# Patient Record
Sex: Male | Born: 1935
Health system: Southern US, Community
[De-identification: ages and names within clinical notes are randomized; demographics above are authoritative.]

## PROBLEM LIST (undated history)

## (undated) DIAGNOSIS — I10 Essential (primary) hypertension: Secondary | ICD-10-CM

## (undated) DIAGNOSIS — K5732 Diverticulitis of large intestine without perforation or abscess without bleeding: Secondary | ICD-10-CM

## (undated) DIAGNOSIS — Z85828 Personal history of other malignant neoplasm of skin: Secondary | ICD-10-CM

## (undated) DIAGNOSIS — IMO0002 Reserved for concepts with insufficient information to code with codable children: Secondary | ICD-10-CM

## (undated) DIAGNOSIS — I4891 Unspecified atrial fibrillation: Secondary | ICD-10-CM

## (undated) DIAGNOSIS — J189 Pneumonia, unspecified organism: Secondary | ICD-10-CM

## (undated) DIAGNOSIS — N189 Chronic kidney disease, unspecified: Secondary | ICD-10-CM

## (undated) DIAGNOSIS — I499 Cardiac arrhythmia, unspecified: Secondary | ICD-10-CM

## (undated) DIAGNOSIS — M1990 Unspecified osteoarthritis, unspecified site: Secondary | ICD-10-CM

## (undated) DIAGNOSIS — R112 Nausea with vomiting, unspecified: Secondary | ICD-10-CM

## (undated) DIAGNOSIS — Z9889 Other specified postprocedural states: Secondary | ICD-10-CM

## (undated) DIAGNOSIS — C61 Malignant neoplasm of prostate: Secondary | ICD-10-CM

## (undated) DIAGNOSIS — T8859XA Other complications of anesthesia, initial encounter: Secondary | ICD-10-CM

## (undated) DIAGNOSIS — T4145XA Adverse effect of unspecified anesthetic, initial encounter: Secondary | ICD-10-CM

## (undated) DIAGNOSIS — K219 Gastro-esophageal reflux disease without esophagitis: Secondary | ICD-10-CM

## (undated) DIAGNOSIS — R51 Headache: Secondary | ICD-10-CM

## (undated) DIAGNOSIS — R519 Headache, unspecified: Secondary | ICD-10-CM

## (undated) HISTORY — PX: NASAL SEPTUM SURGERY: SHX37

## (undated) HISTORY — PX: LASIK: SHX215

## (undated) HISTORY — DX: Reserved for concepts with insufficient information to code with codable children: IMO0002

## (undated) HISTORY — DX: Diverticulitis of large intestine without perforation or abscess without bleeding: K57.32

## (undated) HISTORY — PX: SHOULDER SURGERY: SHX246

## (undated) HISTORY — DX: Unspecified atrial fibrillation: I48.91

## (undated) HISTORY — PX: TONSILLECTOMY: SUR1361

---

## 2001-10-11 ENCOUNTER — Ambulatory Visit (HOSPITAL_COMMUNITY): Admission: RE | Admit: 2001-10-11 | Discharge: 2001-10-11 | Payer: Self-pay | Admitting: *Deleted

## 2007-08-08 ENCOUNTER — Encounter: Admission: RE | Admit: 2007-08-08 | Discharge: 2007-08-08 | Payer: Self-pay | Admitting: Family Medicine

## 2007-08-29 ENCOUNTER — Encounter: Admission: RE | Admit: 2007-08-29 | Discharge: 2007-08-29 | Payer: Self-pay | Admitting: Family Medicine

## 2009-01-07 HISTORY — PX: CARDIOVERSION: SHX1299

## 2010-11-24 ENCOUNTER — Emergency Department (HOSPITAL_COMMUNITY): Payer: Medicare Other

## 2010-11-24 ENCOUNTER — Inpatient Hospital Stay (HOSPITAL_COMMUNITY)
Admission: EM | Admit: 2010-11-24 | Discharge: 2010-12-01 | DRG: 392 | Disposition: A | Discharge status: Home / Self Care | Payer: Medicare Other | Attending: General Surgery | Admitting: General Surgery

## 2010-11-24 DIAGNOSIS — D72829 Elevated white blood cell count, unspecified: Secondary | ICD-10-CM | POA: Diagnosis present

## 2010-11-24 DIAGNOSIS — R21 Rash and other nonspecific skin eruption: Secondary | ICD-10-CM | POA: Diagnosis not present

## 2010-11-24 DIAGNOSIS — K630 Abscess of intestine: Secondary | ICD-10-CM | POA: Diagnosis not present

## 2010-11-24 DIAGNOSIS — J9819 Other pulmonary collapse: Secondary | ICD-10-CM | POA: Diagnosis present

## 2010-11-24 DIAGNOSIS — K5732 Diverticulitis of large intestine without perforation or abscess without bleeding: Principal | ICD-10-CM | POA: Diagnosis present

## 2010-11-24 DIAGNOSIS — Z7982 Long term (current) use of aspirin: Secondary | ICD-10-CM

## 2010-11-24 LAB — COMPREHENSIVE METABOLIC PANEL
AST: 27 U/L (ref 0–37)
Albumin: 3.8 g/dL (ref 3.5–5.2)
CO2: 24 mEq/L (ref 19–32)
Chloride: 103 mEq/L (ref 96–112)
Glucose, Bld: 166 mg/dL — ABNORMAL HIGH (ref 70–99)
Potassium: 4.1 mEq/L (ref 3.5–5.1)
Total Bilirubin: 0.9 mg/dL (ref 0.3–1.2)

## 2010-11-24 LAB — CBC
MCH: 32.4 pg (ref 26.0–34.0)
MCV: 93.7 fL (ref 78.0–100.0)
Platelets: 224 10*3/uL (ref 150–400)
RBC: 4.76 MIL/uL (ref 4.22–5.81)

## 2010-11-24 LAB — URINALYSIS, ROUTINE W REFLEX MICROSCOPIC
Bilirubin Urine: NEGATIVE
Leukocytes, UA: NEGATIVE
Specific Gravity, Urine: 1.027 (ref 1.005–1.030)
pH: 6.5 (ref 5.0–8.0)

## 2010-11-24 LAB — DIFFERENTIAL
Basophils Absolute: 0 10*3/uL (ref 0.0–0.1)
Basophils Relative: 0 % (ref 0–1)
Eosinophils Absolute: 0 10*3/uL (ref 0.0–0.7)
Lymphocytes Relative: 3 % — ABNORMAL LOW (ref 12–46)
Neutro Abs: 18.8 10*3/uL — ABNORMAL HIGH (ref 1.7–7.7)
Neutrophils Relative %: 93 % — ABNORMAL HIGH (ref 43–77)

## 2010-11-24 LAB — URINE MICROSCOPIC-ADD ON

## 2010-11-24 MED ORDER — IOHEXOL 300 MG/ML  SOLN
80.0000 mL | Freq: Once | INTRAMUSCULAR | Status: AC | PRN
  Administered 2010-11-24: 80 mL via INTRAVENOUS

## 2010-11-25 LAB — CBC
Hemoglobin: 13.4 g/dL (ref 13.0–17.0)
MCH: 31.8 pg (ref 26.0–34.0)
Platelets: 169 10*3/uL (ref 150–400)
WBC: 14.6 10*3/uL — ABNORMAL HIGH (ref 4.0–10.5)

## 2010-11-25 LAB — DIFFERENTIAL
Basophils Relative: 0 % (ref 0–1)
Eosinophils Relative: 0 % (ref 0–5)
Lymphocytes Relative: 7 % — ABNORMAL LOW (ref 12–46)
Monocytes Relative: 6 % (ref 3–12)
Neutro Abs: 12.8 10*3/uL — ABNORMAL HIGH (ref 1.7–7.7)

## 2010-11-25 LAB — COMPREHENSIVE METABOLIC PANEL
Alkaline Phosphatase: 48 U/L (ref 39–117)
CO2: 24 mEq/L (ref 19–32)
Calcium: 8.5 mg/dL (ref 8.4–10.5)
Creatinine, Ser: 1.11 mg/dL (ref 0.4–1.5)
GFR calc non Af Amer: >60 mL/min (ref >60)
Glucose, Bld: 137 mg/dL — ABNORMAL HIGH (ref 70–99)

## 2010-11-25 NOTE — H&P (Signed)
[name], Joseph [name] NO.:  [id number]  MEDICAL RECORD NO.:  [id number]           PATIENT TYPE:  I  LOCATION:  5525                         FACILITY:  [facility]  PHYSICIAN:  Standley Dakins, MD   DATE OF BIRTH:  [birth year]  DATE OF ADMISSION:  11/24/2010 DATE OF DISCHARGE:                             HISTORY & PHYSICAL   PRIMARY CARE PHYSICIAN:  Soyla Murphy. Renne Crigler, MD  CHIEF COMPLAINT:  Abdominal pain.  HISTORY OF PRESENT ILLNESS:  This patient is a 75 year old gentleman who reported that he started aching all over his body yesterday.  He reports that he generally did not feel well and he began to experience chills and body sweats and this progressed to  mid-abdominal pain.   He reports that he woke up this morning still not feeling well and had a solid bowel movement.    He reports that after the bowel movement he began to experience dry  heaves and started sweating profusely.  He called and made an appointment with his primary care physician who saw him later this morning.    He was sent to the emergency department immediately with abdominal  pain, possible appendicitis.  The patient was seen and evaluated in the  emergency department and discovered to have a sigmoid diverticulitis  with a small perforation.  The patient will be evaluated by the  Surgical Care Team and was admitted for IV antibiotic treatment.  PAST MEDICAL HISTORY:  History of a recent squamous cell cancer that was removed and history of multiple actinic keratoses.  CURRENT MEDICATIONS: 1. Aspirin 81 mg every other day. 2. Aleve taken p.r.n. for headaches and backaches.  ALLERGIES:  No known drug allergies.  SOCIAL HISTORY:  This patient is a native of [place]  He has 6 years service in the [employer].  He has 16 years of education.  The patient exercises 3-4 times per week.  He is married and has 2-3 alcoholic drinks per week.  REVIEW OF SYSTEMS:  Please see HPI,  otherwise all systems reviewed completely and reported as negative.  FAMILY HISTORY:  The patient reports that his parents died of old age, his mother was aged 63 and father aged 90 when they died.  The patient reports one brother that has a history of prostate cancer.  The patient has two sons that are alive and doing well.  PHYSICAL EXAMINATION:  VITAL SIGNS:  Temperature 99.8, pulse 93, respirations 18, systolic blood pressure 118 and diastolic blood pressure 69, oxygen saturation 93% on room air. GENERAL:  This is a well-developed, well-nourished male.  He is awake, alert, in no distress, cooperative and pleasant and well-spoken. HEENT:  Normocephalic and atraumatic.  Mucous membranes moist and wet. Normal dentition. NECK:  Supple.  No JVD.  Thyroid soft.  No nodules or masses palpated. Trachea midline. LUNGS:  Bilateral breath sounds.  Clear to auscultation.  No crackles, wheezes, or rhonchi. CARDIAC:  Regular S1-S2 sounds without murmurs, rubs, or gallops. ABDOMEN:  Soft with tenderness in the left lower quadrant and guarding present.  Rebound tenderness present in the  left lower quadrant and epigastric area.  Periumbilical tenderness is noted. EXTREMITIES:  No pretibial edema, cyanosis, or clubbing. NEUROLOGIC:  No gross focal deficits.  LABORATORY DATA:  Lipase 27.  Sodium 135, potassium 4.1, glucose 166, creatinine 1.20, bilirubin 0.9, and AST 27.  White blood cell count 20.3, hemoglobin 15.4, hematocrit 44.6, and platelet count 224.  CT of the abdomen and pelvis with contrast positive for perforated sigmoid diverticulitis with small amounts of free intraperitoneal air.  No evidence of abscess or bowel obstruction noted and bibasilar atelectasis noted.  IMPRESSION: 1. Acute perforated sigmoid diverticulitis with small amount of free     intraperitoneal air and no evidence of abscess or bowel     obstruction. 2. Leukocytosis. 3. Bibasilar atelectasis.  PLAN: 1. The  patient is going to be admitted to a Medical Surgical Unit for     IV antibiotics and IV fluids.  The patient will be maintained     n.p.o.  A general surgeon was consulted to evaluate the patient in     the emergency department and will be following the patient during     the hospital stay. 2. I explained the radiological results and laboratory tests to the     patient and his wife who verbalized understanding. 3. We will continue to monitor the patient for signs of worsening     infection or any acute changes.     Standley Dakins, MD     CJ/MEDQ  D:  11/24/2010  T:  11/24/2010  Job:  [id number]  cc:   Soyla Murphy. Renne Crigler, M.D.  Electronically Signed by Standley Dakins  on 11/25/2010 01:18:13 PM

## 2010-11-26 LAB — BASIC METABOLIC PANEL
GFR calc Af Amer: >60 mL/min (ref >60)
GFR calc non Af Amer: >60 mL/min (ref >60)
Potassium: 3.8 mEq/L (ref 3.5–5.1)
Sodium: 137 mEq/L (ref 135–145)

## 2010-11-26 LAB — CBC
HCT: 36 % — ABNORMAL LOW (ref 39.0–52.0)
Hemoglobin: 12 g/dL — ABNORMAL LOW (ref 13.0–17.0)
MCH: 31.4 pg (ref 26.0–34.0)
MCHC: 33.3 g/dL (ref 30.0–36.0)

## 2010-11-27 LAB — CBC
HCT: 33.7 % — ABNORMAL LOW (ref 39.0–52.0)
MCHC: 34.7 g/dL (ref 30.0–36.0)
MCV: 92.8 fL (ref 78.0–100.0)
WBC: 9.2 10*3/uL (ref 4.0–10.5)

## 2010-11-29 ENCOUNTER — Inpatient Hospital Stay (HOSPITAL_COMMUNITY): Payer: Medicare Other

## 2010-11-29 LAB — BASIC METABOLIC PANEL
BUN: 5 mg/dL — ABNORMAL LOW (ref 6–23)
CO2: 26 mEq/L (ref 19–32)
Calcium: 7.7 mg/dL — ABNORMAL LOW (ref 8.4–10.5)
Chloride: 106 mEq/L (ref 96–112)
GFR calc Af Amer: >60 mL/min (ref >60)
GFR calc non Af Amer: >60 mL/min (ref >60)
Glucose, Bld: 109 mg/dL — ABNORMAL HIGH (ref 70–99)
Potassium: 3.4 mEq/L — ABNORMAL LOW (ref 3.5–5.1)

## 2010-11-29 LAB — CBC
MCH: 32.1 pg (ref 26.0–34.0)
MCHC: 35 g/dL (ref 30.0–36.0)
MCV: 91.7 fL (ref 78.0–100.0)

## 2010-11-29 MED ORDER — IOHEXOL 300 MG/ML  SOLN: 100.0000 mL | Freq: Once | INTRAMUSCULAR | Status: AC | PRN

## 2010-11-30 ENCOUNTER — Inpatient Hospital Stay (HOSPITAL_COMMUNITY): Payer: Medicare Other

## 2010-11-30 LAB — PROTIME-INR
INR: 1.17 (ref 0.00–1.49)
Prothrombin Time: 15.1 seconds (ref 11.6–15.2)

## 2010-12-04 LAB — CULTURE, ROUTINE-ABSCESS: Culture: NO GROWTH

## 2010-12-10 NOTE — Consult Note (Signed)
Joseph [name], Joseph [name]                   ACCOUNT NO.:  [id number]  MEDICAL RECORD NO.:  [id number]           PATIENT TYPE:  I  LOCATION:  5525                         FACILITY:  [facility]  PHYSICIAN:  Thornton Park. Daphine Deutscher, MD  DATE OF BIRTH:  February 09, 1936  DATE OF CONSULTATION:  11/25/2010 DATE OF DISCHARGE:                                CONSULTATION   REQUESTING PHYSICIAN:  Zadie Rhine, MD  PRIMARY CARE PHYSICIAN:  Soyla Murphy. Renne Crigler, MD  REASON FOR CONSULTATION:  Perforated sigmoid diverticulitis.  HISTORY OF PRESENT ILLNESS:  Joseph [name] is a 75 year old otherwise healthy white male who developed myalgias, body aches, chills and sweats on Monday morning.  Later in the afternoon, he developed infraumbilical and bilateral lower quadrant abdominal pain.  As the day progressed and his chills and sweats worsened as well as his pain.  Later in the evening around 12 midnight, he had a small bowel movement and then one earlier in the next morning.  He denies any hematochezia.  He then developed significant dry heaves.  Because of worsening pain and symptoms, he presented to his primary care physician yesterday morning. Because he was tender in the right lower quadrant,  he was sent to [facility] emergency department for a CT scan to rule out appendicitis.  Upon arrival, he was found to have a white blood cell count of 20,000.  He had a CT scan of the abdomen and pelvis which revealed perforated sigmoid diverticulitis with a small amount of free intraperitoneal air, but no abscess or bowel obstruction was identified.  The patient states that this is his first episode of documented diverticulitis.  However, he states in the past he has had a few small twinges of pain infraumbilically and in his lower quadrant but has never been treated for.  He did have a colonoscopy by Dr. Carman Ching approximately a year ago.  He does not recall any significant findings.  Because of the CT scan findings, we were  asked to evaluate the patient for further evaluation.  REVIEW OF SYSTEMS:  Please see HPI.  Otherwise, all other systems have been reviewed and are negative.  FAMILY HISTORY:  Noncontributory.  However, the patient does state that his mother has a history of diverticulosis.  PAST MEDICAL HISTORY:  None.  PAST SURGICAL HISTORY: 1. Two right shoulder arthroscopies. 2. One left shoulder arthroscopy. 3. LASIK surgery.  SOCIAL HISTORY:  The patient is married with two children.  He is retired from [employer] work.  He denies any tobacco but has led to occasional alcohol.  ALLERGIES:  NKDA.  MEDICATIONS:  Aspirin 81 mg every other night and Aleve over-the-counter p.r.n. headache.  PHYSICAL EXAMINATION:  GENERAL:  Joseph [name] is a very pleasant 75 year old white male who is otherwise well-developed and well-nourished, but currently lying in bed in no obvious distress.  VITAL SIGNS: Temperature 98.1, pulse 101, respirations 14, blood pressure 99/60. HEENT:  Head is normocephalic, atraumatic.  Sclerae noninjected.  Pupilsare equal, round and reactive to light.  Ears and nose without any obvious masses or lesions.  No rhinorrhea.  Mouth  is pink.  Throat shows no exudate. HEART:  Regular rate and rhythm.  Normal S1-S2.  No murmurs, gallops or rubs are noted.  He does have palpable carotid, radial and pedal pulses bilaterally.  LUNGS:  Clear to auscultation bilaterally with no wheezes, rhonchi or rales noted.  Respiratory is nonlabored. ABDOMEN:  Soft with hypoactive bowel sounds.  He is nondistended.  He is diffusely very tender with active voluntary guarding.  He is minimally symptomatic with questionable early peritoneal signs.  No masses, hernias or organomegaly are noted. MUSCULOSKELETAL:  All four extremities are symmetrical with no cyanosis, clubbing or edema. NEURO:  Cranial nerves II through XII appear to be grossly intact.  Deep tendon reflex exam is deferred at this  time. PSYCH:  The patient is alert and oriented x3 with an appropriate affect.  LABORATORIES AND DIAGNOSTIC:  Sodium 137, potassium 4.2, glucose 137, BUN 16, creatinine 1.11.  LFTs are all unremarkable except for a slight elevation in bilirubin at 1.3, his albumin is 2.9.  White blood cell count is 14,600 down from 20,000 yesterday, hemoglobin 13.4, hematocrit 39.3, platelet count is 169,000 with a neutrophil count of 88%.  Lipase is 27.  Diagnostic CT scan of the abdomen and pelvis reveals a perforated sigmoid diverticulitis with small amount of free intraperitoneal air, but no abscess or obstruction is noted.  IMPRESSION: 1. Perforated sigmoid diverticulitis. 2. Leukocytosis.  PLAN:  Currently, the patient is quite tender and does have some evidence of free intraperitoneal air on a CT scan, however, the patient has been without pain medicine for several hours and still seems quite comfortable and his white blood cell count is decreasing.  Currently, I do not feel that the patient is out of woods from the need of emergent exploration, however, I do not think he currently needs an emergent exploration.  We will try conservative management with n.p.o. status, IV fluids and IV antibiotics.  He is currently on Cipro and Flagyl.  This is his first episode of diverticulitis and so Cipro and Flagyl may be able to suffice for treatment.  However, if he begins to worsen, we may need to switch him to IV Invanz.  I have discussed all this with the patient and his wife.  I have also discussed the possibility of emergent surgery as well as the possible need for a colostomy with them as well. I have also discussed the potential of developing an abscess and a possible percutaneous drain if that were needed.  They understand all of this.  I have discussed the patient with Dr. Marthann Schiller of the [facility].  The patient is otherwise healthy and we will transfer him to [facility].     Letha Cape, PA   ______________________________ Thornton Park Daphine Deutscher, MD    KEO/MEDQ  D:  11/25/2010  T:  11/25/2010  Job:  [id number]  cc:   Zadie Rhine, MD Soyla Murphy. Renne Crigler, M.D. [facility] Surgery  Electronically Signed by Barnetta Chapel PA on 11/30/2010 02:32:36 PM Electronically Signed by Luretha Murphy MD on 12/10/2010 08:42:46 AM

## 2010-12-11 ENCOUNTER — Ambulatory Visit (INDEPENDENT_AMBULATORY_CARE_PROVIDER_SITE_OTHER): Payer: Medicare Other | Admitting: Cardiology

## 2010-12-11 ENCOUNTER — Encounter: Payer: Self-pay | Admitting: Cardiology

## 2010-12-11 ENCOUNTER — Other Ambulatory Visit (HOSPITAL_COMMUNITY): Payer: Self-pay | Admitting: Surgery

## 2010-12-11 ENCOUNTER — Other Ambulatory Visit: Payer: Self-pay | Admitting: Surgery

## 2010-12-11 VITALS — BP 120/80 | HR 104 | Wt 155.0 lb

## 2010-12-11 DIAGNOSIS — K5792 Diverticulitis of intestine, part unspecified, without perforation or abscess without bleeding: Secondary | ICD-10-CM

## 2010-12-11 DIAGNOSIS — K5720 Diverticulitis of large intestine with perforation and abscess without bleeding: Secondary | ICD-10-CM

## 2010-12-11 DIAGNOSIS — I499 Cardiac arrhythmia, unspecified: Secondary | ICD-10-CM

## 2010-12-11 DIAGNOSIS — K5732 Diverticulitis of large intestine without perforation or abscess without bleeding: Secondary | ICD-10-CM

## 2010-12-11 DIAGNOSIS — I4891 Unspecified atrial fibrillation: Secondary | ICD-10-CM | POA: Insufficient documentation

## 2010-12-11 MED ORDER — DILTIAZEM HCL ER COATED BEADS 240 MG PO CP24: 240.0000 mg | ORAL_CAPSULE | Freq: Every day | ORAL | Status: DC

## 2010-12-11 NOTE — Assessment & Plan Note (Signed)
This pleasant 75 year old gentleman was being seen by his surgeon today for a postoperative check and he was noticed by the nurse to have a rapid irregular pulse.  There is no prior history of known arrhythmia.  The patient had been hospitalized several weeks ago for a ruptured diverticulum and has been on for 10 days.  While he was in the hospital there was no mention of any arrhythmia.  The patient has been asymptomatic in terms of his arrhythmia.  He himself was not aware that his heart was out of rhythm.  He has had no chest pain or shortness of breath or dizziness or syncope.  His had no palpitations and no TIA symptoms.

## 2010-12-11 NOTE — Assessment & Plan Note (Signed)
The patient is recovering from a ruptured diverticulum.  He has a drain in place.  He has not been having any fever or chills or postoperative complications

## 2010-12-11 NOTE — Progress Notes (Signed)
Joseph [name] Date of Birth:  1936/03/15 [facility] / [facility] [address]   [address] [place], [place]  [address] [phone]           Fax   [phone]  History of Present Illness: We were asked to see this pleasant 75 year old gentleman by Dr. Wenda Low for evaluation of a rapid irregular heartbeat.  The patient was in Dr. Ermalene Searing office today for a routine postoperative check and his nurse noted that the patient's pulse was rapid and irregular.  The patient has had no cardiac symptoms and was not aware of the arrhythmia.  He has not had any TIA symptoms.  The patient has been home for 10 days from the hospital.  While in the hospital no mention of any arrhythmia was made.  Normally the patient is very physically active and plays tennis several times a week.  There is no history of any exertional chest pain or angina.  The patient denies any history of congestive heart failure diabetes or hypertension.  He is on no regular medicines except for a baby aspirin.  Now he is also on antibiotics postoperatively.His family history is negative for arrhythmias or pacemakers.  Current Outpatient Prescriptions  Medication Sig Dispense Refill  . aspirin 81 MG tablet Take 81 mg by mouth daily. Takes every other day       . ciprofloxacin (CIPRO) 500 MG tablet Take 500 mg by mouth 2 (two) times daily.        . metroNIDAZOLE (FLAGYL) 500 MG tablet Take 500 mg by mouth 3 (three) times daily.        Marland Kitchen diltiazem (CARTIA XT) 240 MG 24 hr capsule Take 1 capsule (240 mg total) by mouth daily.  30 capsule  3    No Known Allergies  Patient Active Problem List  Diagnoses  . Atrial fibrillation with rapid ventricular response  . Diverticulitis of colon with perforation    History  Smoking status  . Never Smoker   Smokeless tobacco  . Not on file    History  Alcohol Use: Not on file    No family history on file.  Review of Systems: Constitutional: no fever chills diaphoresis or  fatigue or change in weight.  Head and neck: no hearing loss, no epistaxis, no photophobia or visual disturbance. Respiratory: No cough, shortness of breath or wheezing. Cardiovascular: No chest pain peripheral edema, palpitations. Gastrointestinal: No abdominal distention, no abdominal pain, no change in bowel habits hematochezia or melena.Recent acute diverticulitis with perforation. Genitourinary: No dysuria, no frequency, no urgency, no nocturia. Musculoskeletal:No arthralgias, no back pain, no gait disturbance or myalgias. Neurological: No dizziness, no headaches, no numbness, no seizures, no syncope, no weakness, no tremors. Hematologic: No lymphadenopathy, no easy bruising. Psychiatric: No confusion, no hallucinations, no sleep disturbance.    Physical Exam: Filed Vitals:   12/11/10 1538  BP: 120/80  Pulse: 104  The general appearance reveals a healthy-appearing gentleman in no distress.Pupils equal and reactive.   Extraocular Movements are full.  There is no scleral icterus.  The mouth and pharynx are normal.  The neck is supple.  The carotids reveal no bruits.  The jugular venous pressure is normal.  The thyroid is not enlarged.  There is no lymphadenopathy.The chest is clear to percussion and auscultation. There are no rales or rhonchi. Expansion of the chest is symmetrical. The precordium is quiet.  The first heart sound is normal.  The second heart sound is physiologically split.  There is no murmur gallop rub or click.  There is no abnormal lift or heave.  The rhythm is irregular.  The abdomen is soft and nontender.  He has a drain in the lower abdomen.The pedal pulses are good.  There is no phlebitis or edema.  There is no cyanosis or clubbing.Strength is normal and symmetrical in all extremities.  There is no lateralizing weakness.  There are no sensory deficits.The skin is warm and dry.  There is no rash.No muscle weakness or atrophy.  No muscle tenderness.  EKG shows atrial  fibrillation with rapid ventricular response.  No ischemic changes.  No prior EKG available for comparison.    Assessment / Plan: Recent onset of atrial fibrillation occurring within the past 10 days.  No precipitating event but he did have a cup of regular coffee this morning which is somewhat unusual for him.  Generally he avoids caffeine.  He has a CHADSS score of zero.  Our plan is to put him on a full dose 325 mg aspirin.  He will not need to go on Coumadin.  We will get an echocardiogram.  We will put him on diltiazem CD 240 mg one daily to control ventricular rate and to see if we can get him back in sinus rhythm.  We'll have him return in one week for a followup office visit.

## 2010-12-14 ENCOUNTER — Ambulatory Visit (HOSPITAL_COMMUNITY): Payer: Medicare Other | Attending: Internal Medicine | Admitting: Radiology

## 2010-12-14 DIAGNOSIS — I499 Cardiac arrhythmia, unspecified: Secondary | ICD-10-CM

## 2010-12-14 DIAGNOSIS — I4891 Unspecified atrial fibrillation: Secondary | ICD-10-CM

## 2010-12-15 ENCOUNTER — Encounter (HOSPITAL_COMMUNITY): Payer: Self-pay

## 2010-12-15 ENCOUNTER — Ambulatory Visit (HOSPITAL_COMMUNITY)
Admission: RE | Admit: 2010-12-15 | Discharge: 2010-12-15 | Disposition: A | Discharge status: Home / Self Care | Payer: Medicare Other | Source: Ambulatory Visit | Attending: Surgery | Admitting: Surgery

## 2010-12-15 DIAGNOSIS — K5792 Diverticulitis of intestine, part unspecified, without perforation or abscess without bleeding: Secondary | ICD-10-CM

## 2010-12-15 DIAGNOSIS — K5732 Diverticulitis of large intestine without perforation or abscess without bleeding: Secondary | ICD-10-CM | POA: Insufficient documentation

## 2010-12-15 MED ORDER — IOHEXOL 300 MG/ML  SOLN
100.0000 mL | Freq: Once | INTRAMUSCULAR | Status: AC | PRN
  Administered 2010-12-15: 100 mL via INTRAVENOUS

## 2010-12-15 NOTE — Discharge Summary (Signed)
NAMESABIN, GIBEAULT NO.:  [id number]  MEDICAL RECORD NO.:  [id number]           PATIENT TYPE:  I  LOCATION:  5525                         FACILITY:  [facility]  PHYSICIAN:  Almond Lint, MD       DATE OF BIRTH:  [birth year]  DATE OF ADMISSION:  11/24/2010 DATE OF DISCHARGE:  12/01/2010                              DISCHARGE SUMMARY   HISTORY OF PRESENT ILLNESS:  Mr. Joseph [name] is a 75 year old male who presented with complaint of abdominal pain and generalized body aches. He made his appointment with his primary care physician who actually send him to the emergency department immediately concern for an acute abdomen.  Upon evaluation in the emergency department, the patient was worked up and was found have an elevated white blood cell count of 20,000 with CT scan showing evidence of sigmoid diverticulitis with perforation and small amounts of air.  No evidence of abscess or obstruction was noted at that time.  The patient was seen by Dr. Daphine Deutscher and decision was made to admit the patient for inpatient management.  SUMMARY OF HOSPITAL WORSE:  The patient was admitted on November 24, 2010, was started on IV antibiotics and placed at bowel rest.  Within the first 24 hours, the patient did feel somewhat better and his fever had defervesced and white count diminished from 20,000 to 14,000.  He continued clinically improving by both pain as well as white count, which actually normalized by day three of his stay.  He did well on IV antibiotics.  His diet was started on clear liquid advanced to full liquids.  A followup CT scan was ordered and carried out on November 29, 2010, this showed improved, but persistent inflammatory changes in diverticulitis, but there was now a fluid collection measuring approximately 3 x 2.5 cm suspicious for developing abscess.  The Interventional Radiology team was asked to review the CT and a subsequent CT-guided percutaneous drain placement was  carried out on November 30, 2010.  The patient tolerated this well and has not had any change in his clinical symptoms.  He continues to be afebrile and feel significantly better.  The patient's diet was advanced to a low residue diet and the Nutrition Service educated the patient on this diet to continue at home.  He was switched from IV antibiotics to pill-form antibiotics which he has tolerated well.  He did develop a macular pruritic rash on his back that was initially thought to be possibly contact dermatitis from his bed linens, but this could possibly be drug reaction as he now is starting to have some of these lesions develop on his anterior trunk.  However, he has had no shortness of breath, wheezing or breathing difficulty as far as reaction goes.  It is our opinion at this point that the patient could be discharged home in a stable condition.  All his instructions have been reviewed with him.  DISCHARGE DIAGNOSIS:  Sigmoid diverticulitis with microperforation and abscess, now status post percutaneous drainage.  DISCHARGE MEDICATIONS:  The patient will be given a prescription for: 1. Cipro 500 mg one  tablet twice daily. 2. Flagyl 500 mg one tablet three times daily. 3. Vicodin 5/500 one tablet q.4-6 h. p.r.n. pain. 4. He can resume his 81 mg aspirin at home.  He is to follow a low-residue diet as directed.  He is to flush his drain was 5 mL normal saline twice daily while at home and measure his output.  He is to follow up with Dr. Daphine Deutscher in approximately 7-10 days for continued management.  The patient understands that he has recurrence of severe abdominal pain, nausea, vomiting, or high fever, and he is to contact our office immediately for possible readmission for recurrent diverticulitis.     Brayton El, PA-C   ______________________________ Almond Lint, MD    KB/MEDQ  D:  12/01/2010  T:  12/01/2010  Job:  [id number]  Electronically Signed by Brayton El  on  12/10/2010 09:16:02 AM Electronically Signed by Almond Lint MD on 12/15/2010 03:52:47 PM

## 2010-12-16 ENCOUNTER — Ambulatory Visit (INDEPENDENT_AMBULATORY_CARE_PROVIDER_SITE_OTHER): Payer: Medicare Other | Admitting: Nurse Practitioner

## 2010-12-16 ENCOUNTER — Encounter: Payer: Self-pay | Admitting: Nurse Practitioner

## 2010-12-16 ENCOUNTER — Telehealth: Payer: Self-pay | Admitting: *Deleted

## 2010-12-16 VITALS — BP 108/62 | HR 92 | Wt 155.0 lb

## 2010-12-16 DIAGNOSIS — K5732 Diverticulitis of large intestine without perforation or abscess without bleeding: Secondary | ICD-10-CM

## 2010-12-16 DIAGNOSIS — I4891 Unspecified atrial fibrillation: Secondary | ICD-10-CM

## 2010-12-16 DIAGNOSIS — K5720 Diverticulitis of large intestine with perforation and abscess without bleeding: Secondary | ICD-10-CM

## 2010-12-16 LAB — CBC WITH DIFFERENTIAL/PLATELET
Basophils Absolute: 0 10*3/uL (ref 0.0–0.1)
Basophils Relative: 0.7 % (ref 0.0–3.0)
Eosinophils Absolute: 0.2 10*3/uL (ref 0.0–0.7)
Eosinophils Relative: 2.7 % (ref 0.0–5.0)
HCT: 42.9 % (ref 39.0–52.0)
Hemoglobin: 14.5 g/dL (ref 13.0–17.0)
Lymphocytes Relative: 21.9 % (ref 12.0–46.0)
Lymphs Abs: 1.3 10*3/uL (ref 0.7–4.0)
MCHC: 33.9 g/dL (ref 30.0–36.0)
MCV: 97.9 fl (ref 78.0–100.0)
Monocytes Absolute: 0.6 10*3/uL (ref 0.1–1.0)
Monocytes Relative: 10.3 % (ref 3.0–12.0)
Neutro Abs: 3.8 10*3/uL (ref 1.4–7.7)
Neutrophils Relative %: 64.4 % (ref 43.0–77.0)
Platelets: 290 10*3/uL (ref 150.0–400.0)
RBC: 4.38 Mil/uL (ref 4.22–5.81)
RDW: 14.3 % (ref 11.5–14.6)
WBC: 5.9 10*3/uL (ref 4.5–10.5)

## 2010-12-16 LAB — BASIC METABOLIC PANEL
BUN: 11 mg/dL (ref 6–23)
CO2: 28 mEq/L (ref 19–32)
Calcium: 8.9 mg/dL (ref 8.4–10.5)
Chloride: 104 mEq/L (ref 96–112)
Creatinine, Ser: 1 mg/dL (ref 0.4–1.5)
GFR: 74.34 mL/min (ref >60.00)
Glucose, Bld: 99 mg/dL (ref 70–99)
Potassium: 4.5 mEq/L (ref 3.5–5.1)
Sodium: 139 mEq/L (ref 135–145)

## 2010-12-16 LAB — TSH: TSH: 1.71 u[IU]/mL (ref 0.35–5.50)

## 2010-12-16 NOTE — Telephone Encounter (Signed)
Message copied by Regis Bill on Wed Dec 16, 2010  3:33 PM ------      Message from: Norma Fredrickson      Created: Wed Dec 16, 2010  2:11 PM       Ok to report. Labs are satisfactory. I have sent a copy to Dr. Daphine Deutscher.

## 2010-12-16 NOTE — Progress Notes (Signed)
Left message

## 2010-12-16 NOTE — Telephone Encounter (Signed)
Advised patient of labs 

## 2010-12-16 NOTE — Patient Instructions (Addendum)
You are still in atrial fibrillation I am going to start you on anticoagulation with Pradaxa 150 mg 2 x day. You will see Dr. Patty Sermons next week with a repeat EKG and CBC Call for any problems  Stop aspirin on Friday

## 2010-12-16 NOTE — Assessment & Plan Note (Signed)
Drainage tube is out. He has no evidence of bleeding. CBC will be checked today. If he were to need surgical intervention, the Pradaxa could be stopped 48 hours prior. He will be seeing Dr. Daphine Deutscher tomorrow.

## 2010-12-16 NOTE — Assessment & Plan Note (Signed)
I have discussed his care with Dr. [place]. He has not converted back to sinus. He probably needs one attempt at cardioversion. He will need anticoagulation and Pradaxa is felt to be his better option. He remains on antibiotics.  We have placed him on Pradaxa 150 mg BID. Samples are given. Aspirin will be stopped on Friday. He will be seen again in one week with a repeat EKG. We will also check CBC, BMET and TSH today to rule out other causes. Patient and his wife are agreeable to this plan and will call if any problems develop in the interim.

## 2010-12-16 NOTE — Progress Notes (Signed)
   Joseph [name] Date of Birth: 1936/07/18   History of Present Illness: Joseph [name] is seen back today for a 1 week check. He is seen for Dr. Patty Sermons. He has had a recent admission for a small perforated sigmoid diverticulum. He was managed with antibiotics. His drainage tube was removed yesterday. He is in atrial fib. Duration is unknown. CHADs score is 0. He was placed on aspirin and Cardizem. He is totally asymptomatic from the atrial fib. He is not lightheaded or dizzy. Overall he feels stronger. He really doesn't have palpitations. He will be seeing Dr. Daphine Deutscher for follow up tomorrow.   Current Outpatient Prescriptions on File Prior to Visit  Medication Sig Dispense Refill  . ciprofloxacin (CIPRO) 500 MG tablet Take 500 mg by mouth 2 (two) times daily.        Marland Kitchen diltiazem (CARTIA XT) 240 MG 24 hr capsule Take 1 capsule (240 mg total) by mouth daily.  30 capsule  3  . metroNIDAZOLE (FLAGYL) 500 MG tablet Take 500 mg by mouth 3 (three) times daily.        Marland Kitchen aspirin 81 MG tablet Take 81 mg by mouth daily. Takes every other day         No Known Allergies  Past Medical History  Diagnosis Date  . Atrial fibrillation   . Sigmoid diverticulitis April of 2012    with small perforation  . Squamous cell carcinoma   . OA (osteoarthritis)     Past Surgical History  Procedure Date  . Shoulder surgery     x 2  . Nasal septum surgery   . Lasik     History  Smoking status  . Never Smoker   Smokeless tobacco  . Never Used    History  Alcohol Use No    History reviewed. No pertinent family history.  Review of Systems: The review of systems is as above. He is not having chest pain. He is not dizzy. No palpitations. No fever or chills. Abdomen is feeling better.  All other systems were reviewed and are negative.  Physical Exam: BP 108/62  Pulse 92  Wt 155 lb (70.308 kg) Patient is very pleasant and in no acute distress. He looks younger than his stated age. Skin is warm and dry. Color is  normal.  HEENT is unremarkable. Normocephalic/atraumatic. PERRL. Sclera are nonicteric. Neck is supple. No masses. No JVD. Lungs are clear. Cardiac exam shows an irregular rhythm. Abdomen is soft. Extremities are without edema. Gait and ROM are intact. No gross neurologic deficits noted.  LABORATORY DATA:  EKG today continues to show atrial fib. Rate is controlled. His echo showed normal LV function. He has mild LAE and RAE. He had only mild mitral regurgitation.  Assessment / Plan:

## 2010-12-16 NOTE — Progress Notes (Signed)
Advised patient

## 2010-12-21 ENCOUNTER — Other Ambulatory Visit (HOSPITAL_COMMUNITY): Payer: Medicare Other | Admitting: Radiology

## 2010-12-24 ENCOUNTER — Other Ambulatory Visit (INDEPENDENT_AMBULATORY_CARE_PROVIDER_SITE_OTHER): Payer: Medicare Other | Admitting: *Deleted

## 2010-12-24 ENCOUNTER — Encounter: Payer: Self-pay | Admitting: Cardiology

## 2010-12-24 ENCOUNTER — Ambulatory Visit (INDEPENDENT_AMBULATORY_CARE_PROVIDER_SITE_OTHER): Payer: Medicare Other | Admitting: Cardiology

## 2010-12-24 VITALS — BP 110/70 | HR 81 | Wt 154.0 lb

## 2010-12-24 DIAGNOSIS — K5720 Diverticulitis of large intestine with perforation and abscess without bleeding: Secondary | ICD-10-CM

## 2010-12-24 DIAGNOSIS — I4891 Unspecified atrial fibrillation: Secondary | ICD-10-CM

## 2010-12-24 DIAGNOSIS — K5732 Diverticulitis of large intestine without perforation or abscess without bleeding: Secondary | ICD-10-CM

## 2010-12-24 DIAGNOSIS — Z01818 Encounter for other preprocedural examination: Secondary | ICD-10-CM

## 2010-12-24 LAB — CBC WITH DIFFERENTIAL/PLATELET
Basophils Absolute: 0 10*3/uL (ref 0.0–0.1)
Basophils Relative: 0.7 % (ref 0.0–3.0)
Eosinophils Absolute: 0.2 10*3/uL (ref 0.0–0.7)
Eosinophils Relative: 3.6 % (ref 0.0–5.0)
HCT: 39.6 % (ref 39.0–52.0)
Hemoglobin: 13.5 g/dL (ref 13.0–17.0)
Lymphocytes Relative: 31.8 % (ref 12.0–46.0)
Lymphs Abs: 1.7 10*3/uL (ref 0.7–4.0)
MCHC: 34.1 g/dL (ref 30.0–36.0)
MCV: 98.3 fl (ref 78.0–100.0)
Monocytes Absolute: 0.6 10*3/uL (ref 0.1–1.0)
Monocytes Relative: 10.4 % (ref 3.0–12.0)
Neutro Abs: 2.9 10*3/uL (ref 1.4–7.7)
Neutrophils Relative %: 53.5 % (ref 43.0–77.0)
Platelets: 226 10*3/uL (ref 150.0–400.0)
RBC: 4.03 Mil/uL — ABNORMAL LOW (ref 4.22–5.81)
RDW: 14.4 % (ref 11.5–14.6)
WBC: 5.5 10*3/uL (ref 4.5–10.5)

## 2010-12-24 MED ORDER — DILTIAZEM HCL ER COATED BEADS 300 MG PO CP24
300.0000 mg | ORAL_CAPSULE | Freq: Every day | ORAL | Status: DC
Start: 2010-12-24 — End: 2011-01-19

## 2010-12-24 NOTE — Assessment & Plan Note (Signed)
The patient has a history of recent onset of atrial fibrillation.  The atrial fibrillation was discovered during a routine postoperative surgical visit with Dr. Daphine Deutscher.  His ventricular response was initially rapid but has improved since the addition of diltiazem CD 240 mg daily.  The patient has not been expressing any chest pain or shortness of breath.  He still notes that his heart rate exceeds 100 per minute with any type of exertion.

## 2010-12-24 NOTE — Progress Notes (Signed)
Joseph [name] Date of Birth:  1936/04/02 [facility] / [facility] [address]   [address] [place], [place]  [address] [phone]           Fax   [phone]  HPI: This pleasant 75 year old gentleman is seen for further followup of his recently discovered atrial fibrillation.  He has been on Pradaxa 150 mg twice a day since 12/16/10.  He is not experiencing any thromboembolic symptoms.  He's had no chest pain or shortness of breath.  He notes that his heart rate continues to have a little fast despite being on diltiazem 240 mg daily.  Current Outpatient Prescriptions  Medication Sig Dispense Refill  . dabigatran (PRADAXA) 150 MG CAPS Take 150 mg by mouth every 12 (twelve) hours.        Marland Kitchen DISCONTD: diltiazem (CARTIA XT) 240 MG 24 hr capsule Take 1 capsule (240 mg total) by mouth daily.  30 capsule  3  . diltiazem (CARDIZEM CD) 300 MG 24 hr capsule Take 1 capsule (300 mg total) by mouth daily.  30 capsule  11  . DISCONTD: aspirin 325 MG EC tablet Take 325 mg by mouth daily.        Marland Kitchen DISCONTD: aspirin 81 MG tablet Take 81 mg by mouth daily. Takes every other day       . DISCONTD: ciprofloxacin (CIPRO) 500 MG tablet Take 500 mg by mouth 2 (two) times daily.        Marland Kitchen DISCONTD: metroNIDAZOLE (FLAGYL) 500 MG tablet Take 500 mg by mouth 3 (three) times daily.          No Known Allergies  Patient Active Problem List  Diagnoses  . Atrial fibrillation with rapid ventricular response  . Diverticulitis of colon with perforation    History  Smoking status  . Never Smoker   Smokeless tobacco  . Never Used    History  Alcohol Use No    No family history on file.  Review of Systems: The patient denies any heat or cold intolerance.  No weight gain or weight loss.  The patient denies headaches or blurry vision.  There is no cough or sputum production.  The patient denies dizziness.  There is no hematuria or hematochezia.  The patient denies any muscle aches or arthritis.  The  patient denies any rash.  The patient denies frequent falling or instability.  There is no history of depression or anxiety.  All other systems were reviewed and are negative.   Physical Exam: Filed Vitals:   12/24/10 1456  BP: 110/70  Pulse: 81  The general appearance reveals a well-developed well-nourished gentleman in no distress.Pupils equal and reactive.   Extraocular Movements are full.  There is no scleral icterus.  The mouth and pharynx are normal.  The neck is supple.  The carotids reveal no bruits.  The jugular venous pressure is normal.  The thyroid is not enlarged.  There is no lymphadenopathy.The chest is clear to percussion and auscultation. There are no rales or rhonchi. Expansion of the chest is symmetrical.  Heart reveals no murmur gallop or rub.  The rhythm is irregular.The abdomen is soft and nontender. Bowel sounds are normal. The liver and spleen are not enlarged. There Are no abdominal masses. There are no bruits.  Normal extremity.Strength is normal and symmetrical in all extremities.  There is no lateralizing weakness.  There are no sensory deficits.  EKG today shows atrial fibrillation with a controlled ventricular response and no ischemic  changes.  Assessment / Plan: Continue same medication except increase Diltiazem to 300 mg daily.We will plan for elective cardioversion on Thursday, June 7.  He will return on June 5 for a pre-cardioversion office visit and lab work.He had a chest x-ray on 11/24/10 showing normal heart size and clear lungs.  He had an echocardiogram on 12/14/10 showing ejection fraction 55-60%, mild mitral regurgitation, mild left atrial enlargement

## 2010-12-24 NOTE — Assessment & Plan Note (Signed)
The patient has finished up his antibiotics 4 days ago.  The surgical drain had been removed a week ago.  He is not showing any sign of infection or sepsis.

## 2010-12-25 NOTE — Progress Notes (Signed)
Advised 

## 2010-12-25 NOTE — Procedures (Signed)
[facility]  Patient:    Joseph [name], Joseph [name] Visit Number: [id number] MRN: [id number]          Service Type: END Location: ENDO Attending Physician:  Sabino Gasser Dictated by:   Sabino Gasser, M.D. Proc. Date: 10/11/01 Admit Date:  10/11/2001   CC:         Oley Balm. Georgina Pillion, M.D.   Procedure Report  DATE OF BIRTH:  August 26, 1935  PROCEDURE:  Colonoscopy.  INDICATION:  Colon cancer screening.  PREP:  Visicol tablets.  The patient tolerated it very well, and the prep was excellent.  MEDICATIONS:  DESCRIPTION OF PROCEDURE:  With the patient mildly sedated in the left lateral decubitus position, a rectal exam was performed which was unremarkable. Subsequently, the Olympus videoscopic colonoscope was inserted into the rectum and passed under direct vision to the cecum, identified by the ileocecal valve and appendiceal orifice, both of which were photographed.  From this point, the colonoscope was slowly withdrawn, taking circumferential views of the entire colonic mucosa, stopping only then in the rectum which appeared normal on direct and showed moderately large hemorrhoids on retroflexed view and were internal.  The endoscope was straightened and pulled through the anal canal. Again, the hemorrhoids were seen.  Otherwise unremarkable.  The endoscope was withdrawn.  The patients vital signs and pulse oximeter remained stable.  The patient tolerated the procedure well without apparent complications.  FINDINGS: 1. Moderately large internal hemorrhoids. 2. Otherwise unremarkable examination to the cecum.  PLAN:  We will discuss treatment for hemorrhoids and a high fiber diet and have patient follow up with me on as-needed basis. Dictated by:   Sabino Gasser, M.D. Attending Physician:  Sabino Gasser DD:  10/11/01 TD:  10/11/01 Job: [id number]

## 2010-12-29 ENCOUNTER — Telehealth: Payer: Self-pay | Admitting: *Deleted

## 2010-12-29 NOTE — Progress Notes (Signed)
Left message

## 2010-12-29 NOTE — Telephone Encounter (Signed)
Message copied by Regis Bill on Tue Dec 29, 2010  3:13 PM ------      Message from: Norma Fredrickson      Created: Fri Dec 25, 2010  7:47 AM       Ok to report. Labs are satisfactory.

## 2010-12-29 NOTE — Telephone Encounter (Signed)
Advised of labs 

## 2011-01-05 ENCOUNTER — Ambulatory Visit: Payer: Medicare Other | Admitting: Cardiology

## 2011-01-12 ENCOUNTER — Ambulatory Visit (INDEPENDENT_AMBULATORY_CARE_PROVIDER_SITE_OTHER): Payer: Medicare Other | Admitting: Cardiology

## 2011-01-12 ENCOUNTER — Telehealth: Payer: Self-pay | Admitting: Cardiology

## 2011-01-12 ENCOUNTER — Encounter: Payer: Self-pay | Admitting: Cardiology

## 2011-01-12 ENCOUNTER — Other Ambulatory Visit (INDEPENDENT_AMBULATORY_CARE_PROVIDER_SITE_OTHER): Payer: Medicare Other | Admitting: *Deleted

## 2011-01-12 VITALS — BP 110/60 | HR 82 | Wt 154.2 lb

## 2011-01-12 DIAGNOSIS — Z01818 Encounter for other preprocedural examination: Secondary | ICD-10-CM

## 2011-01-12 DIAGNOSIS — I4891 Unspecified atrial fibrillation: Secondary | ICD-10-CM

## 2011-01-12 LAB — BASIC METABOLIC PANEL
BUN: 15 mg/dL (ref 6–23)
CO2: 30 mEq/L (ref 19–32)
Chloride: 106 mEq/L (ref 96–112)
Creatinine, Ser: 1 mg/dL (ref 0.4–1.5)
Glucose, Bld: 71 mg/dL (ref 70–99)

## 2011-01-12 LAB — CBC WITH DIFFERENTIAL/PLATELET
Basophils Relative: 0.5 % (ref 0.0–3.0)
Eosinophils Absolute: 0.1 10*3/uL (ref 0.0–0.7)
HCT: 45.1 % (ref 39.0–52.0)
Hemoglobin: 15.4 g/dL (ref 13.0–17.0)
MCHC: 34.1 g/dL (ref 30.0–36.0)
MCV: 98.4 fl (ref 78.0–100.0)
Monocytes Absolute: 0.5 10*3/uL (ref 0.1–1.0)
Neutro Abs: 3.6 10*3/uL (ref 1.4–7.7)
RBC: 4.58 Mil/uL (ref 4.22–5.81)

## 2011-01-12 NOTE — Telephone Encounter (Signed)
HAS A BILL THAT HE HAS QUESTIONS ABOUT

## 2011-01-12 NOTE — Assessment & Plan Note (Signed)
The patient has a past history of atrial fibrillation.  The arrhythmia was discovered during a postoperative office visit at Dr. Ermalene Searing office.  The patient has been on Pradaxa for a satisfactory interval and is now ready for elective cardioversion .  He has not been experiencing any chest pain or shortness of breath.  He has not had any thromboembolic symptoms.  He has a normal ejection fraction of 55 percent

## 2011-01-12 NOTE — Progress Notes (Signed)
Joseph [name] Date of Birth:  1935/11/03 [facility] / [facility] [address]   [address] [place], [place]  [address] [phone]           Fax   [phone]  HPI: This pleasant 75 year old gentleman is seen for a pre-cardioversion office visit.  His cardioversion is scheduled for 2 PM on Thursday, June 7.  The patient has been on Pradaxa and Cardizem and he will take both of his medications that morning.  We went over the expected course of Events and we plan to see him back in the office on week after cardioversion.  If he remains in normal sinus rhythm post cardioversion we will probably stop Pradaxa after 3 months  Current Outpatient Prescriptions  Medication Sig Dispense Refill  . dabigatran (PRADAXA) 150 MG CAPS Take 150 mg by mouth every 12 (twelve) hours.        Marland Kitchen diltiazem (CARDIZEM CD) 300 MG 24 hr capsule Take 1 capsule (300 mg total) by mouth daily.  30 capsule  11    No Known Allergies  Patient Active Problem List  Diagnoses  . Atrial fibrillation with rapid ventricular response  . Diverticulitis of colon with perforation    History  Smoking status  . Never Smoker   Smokeless tobacco  . Never Used    History  Alcohol Use No    No family history on file.  Review of Systems: The patient denies any heat or cold intolerance.  No weight gain or weight loss.  The patient denies headaches or blurry vision.  There is no cough or sputum production.  The patient denies dizziness.  There is no hematuria or hematochezia.  The patient denies any muscle aches or arthritis.  The patient denies any rash.  The patient denies frequent falling or instability.  There is no history of depression or anxiety.  All other systems were reviewed and are negative.   Physical Exam: Filed Vitals:   01/12/11 1105  BP: 110/60  Pulse: 82  The general appearance reveals a healthy-appearing gentleman in no distress.  He does have what appears to be thrush of his tongue from  previous antibiotics and we were giving him a description for Mycostatin oral suspension 5 cc swish and swallow 4 times a day.The chest is clear to percussion and auscultation. There are no rales or rhonchi. Expansion of the chest is symmetrical.The precordium is quiet.  The first heart sound is normal.  The second heart sound is physiologically split.  There is no murmur gallop rub or click.  There is no abnormal lift or heave. Rhythm is irregular.The abdomen is soft and nontender. Bowel sounds are normal. The liver and spleen are not enlarged. There Are no abdominal masses. There are no bruits.  Normal extremity.  EKG confirms persistent atrial fib with a controlled ventricular response    Assessment / Plan: Elective direct current cardioversion on Thursday, June 7 at 2 PM

## 2011-01-13 ENCOUNTER — Telehealth: Payer: Self-pay | Admitting: *Deleted

## 2011-01-13 NOTE — Telephone Encounter (Signed)
Message copied by Burnell Blanks on Wed Jan 13, 2011 11:14 AM ------      Message from: Cassell Clement      Created: Wed Jan 13, 2011 10:45 AM       Please report.  The labs are all satisfactory.

## 2011-01-13 NOTE — Progress Notes (Signed)
Left message

## 2011-01-13 NOTE — Telephone Encounter (Signed)
Left message labs ok for cversion tomorrow, patient said at ov ok to leave on machine.

## 2011-01-14 ENCOUNTER — Ambulatory Visit (HOSPITAL_COMMUNITY)
Admission: RE | Admit: 2011-01-14 | Discharge: 2011-01-14 | Disposition: A | Discharge status: Home / Self Care | Payer: Medicare Other | Source: Ambulatory Visit | Attending: Cardiology | Admitting: Cardiology

## 2011-01-14 DIAGNOSIS — I4891 Unspecified atrial fibrillation: Secondary | ICD-10-CM

## 2011-01-14 DIAGNOSIS — K5730 Diverticulosis of large intestine without perforation or abscess without bleeding: Secondary | ICD-10-CM | POA: Insufficient documentation

## 2011-01-19 ENCOUNTER — Ambulatory Visit (INDEPENDENT_AMBULATORY_CARE_PROVIDER_SITE_OTHER): Payer: Medicare Other | Admitting: Cardiology

## 2011-01-19 ENCOUNTER — Encounter: Payer: Self-pay | Admitting: Cardiology

## 2011-01-19 VITALS — BP 126/64 | HR 59 | Ht 67.0 in | Wt 156.8 lb

## 2011-01-19 DIAGNOSIS — I4891 Unspecified atrial fibrillation: Secondary | ICD-10-CM

## 2011-01-19 MED ORDER — DABIGATRAN ETEXILATE MESYLATE 150 MG PO CAPS: 150.0000 mg | ORAL_CAPSULE | Freq: Two times a day (BID) | ORAL | Status: DC

## 2011-01-19 MED ORDER — DILTIAZEM HCL ER COATED BEADS 300 MG PO CP24: 300.0000 mg | ORAL_CAPSULE | Freq: Every day | ORAL | Status: DC

## 2011-01-19 NOTE — Progress Notes (Signed)
Joseph [name] Date of Birth:  [birth year] [facility] / [facility] [address]   [address] [place], [place]  [address] [phone]           Fax   [phone]  HPI: This pleasant 75 year old gentleman returns for a post cardioversion office visit and EKG.  He underwent elective direct current cardioversion at [facility] on Thursday, June 7.  He converted with a single shock.  There were no complications.  The patient feels well.  Current Outpatient Prescriptions  Medication Sig Dispense Refill  . dabigatran (PRADAXA) 150 MG CAPS Take 1 capsule (150 mg total) by mouth every 12 (twelve) hours.  60 capsule  5  . diltiazem (CARDIZEM CD) 300 MG 24 hr capsule Take 1 capsule (300 mg total) by mouth daily.  30 capsule  5  . DISCONTD: dabigatran (PRADAXA) 150 MG CAPS Take 150 mg by mouth every 12 (twelve) hours.        Marland Kitchen DISCONTD: dabigatran (PRADAXA) 150 MG CAPS Take 1 capsule (150 mg total) by mouth every 12 (twelve) hours.  60 capsule  3  . DISCONTD: diltiazem (CARDIZEM CD) 300 MG 24 hr capsule Take 1 capsule (300 mg total) by mouth daily.  30 capsule  11    No Known Allergies  Patient Active Problem List  Diagnoses  . Atrial fibrillation with rapid ventricular response  . Diverticulitis of colon with perforation    History  Smoking status  . Never Smoker   Smokeless tobacco  . Never Used    History  Alcohol Use No    No family history on file.  Review of Systems: The patient denies any heat or cold intolerance.  No weight gain or weight loss.  The patient denies headaches or blurry vision.  There is no cough or sputum production.  The patient denies dizziness.  There is no hematuria or hematochezia.  The patient denies any muscle aches or arthritis.  The patient denies any rash.  The patient denies frequent falling or instability.  There is no history of depression or anxiety.  All other systems were reviewed and are negative.   Physical Exam: Filed  Vitals:   01/19/11 1130  BP: 126/64  Pulse: 59  The general appearance reveals a well-developed well-nourished gentleman in no distress.The head and neck exam reveals pupils equal and reactive.  Extraocular movements are full.  There is no scleral icterus.  The mouth and pharynx are normal.  The neck is supple.  The carotids reveal no bruits.  The jugular venous pressure is normal.  The  thyroid is not enlarged.  There is no lymphadenopathy.  The chest is clear to percussion and auscultation.  There are no rales or rhonchi.  Expansion of the chest is symmetrical.  The precordium is quiet.  The first heart sound is normal.  The second heart sound is physiologically split.  There is no murmur gallop rub or click.  There is no abnormal lift or heave.  The abdomen is soft and nontender.  The bowel sounds are normal.  The liver and spleen are not enlarged.  There are no abdominal masses.  There are no abdominal bruits.  Extremities reveal good pedal pulses.  There is no phlebitis or edema.  There is no cyanosis or clubbing.  Strength is normal and symmetrical in all extremities.  There is no lateralizing weakness.  There are no sensory deficits.  The skin is warm and dry.  There is no rash.  EKG confirms normal sinus rhythm at 59 per minute And no ischemic changes.  Assessment / Plan: We will plan to continuePradaxa for another 2 months and if he remains in normal sinus rhythm during that period of time we will stop the Pradaxa after 2 months and continue with just an aspirin.  The patient will remain on his diltiazem 300 mg tablet which helps his blood pressure and is also help to normalize his bowel movements.

## 2011-01-19 NOTE — Assessment & Plan Note (Signed)
This pleasant 75 year old gentleman returns after having undergone electrical cardioversion successfully last week.  He has felt well since the cardioversion.  He is remaining in normal sinus rhythm.  He has not had any thromboembolic symptoms.  He remains on Pradaxa to prevent embolism

## 2011-01-21 ENCOUNTER — Other Ambulatory Visit (INDEPENDENT_AMBULATORY_CARE_PROVIDER_SITE_OTHER): Payer: Self-pay | Admitting: Surgery

## 2011-01-21 DIAGNOSIS — K5792 Diverticulitis of intestine, part unspecified, without perforation or abscess without bleeding: Secondary | ICD-10-CM

## 2011-01-22 ENCOUNTER — Encounter (INDEPENDENT_AMBULATORY_CARE_PROVIDER_SITE_OTHER): Payer: Self-pay | Admitting: Surgery

## 2011-01-26 ENCOUNTER — Ambulatory Visit (HOSPITAL_COMMUNITY)
Admission: RE | Admit: 2011-01-26 | Discharge: 2011-01-26 | Disposition: A | Discharge status: Home / Self Care | Payer: Medicare Other | Source: Ambulatory Visit | Attending: Surgery | Admitting: Surgery

## 2011-01-26 DIAGNOSIS — R509 Fever, unspecified: Secondary | ICD-10-CM | POA: Insufficient documentation

## 2011-01-26 DIAGNOSIS — K5730 Diverticulosis of large intestine without perforation or abscess without bleeding: Secondary | ICD-10-CM | POA: Insufficient documentation

## 2011-01-26 DIAGNOSIS — K5792 Diverticulitis of intestine, part unspecified, without perforation or abscess without bleeding: Secondary | ICD-10-CM

## 2011-01-26 DIAGNOSIS — R109 Unspecified abdominal pain: Secondary | ICD-10-CM | POA: Insufficient documentation

## 2011-01-28 ENCOUNTER — Encounter (INDEPENDENT_AMBULATORY_CARE_PROVIDER_SITE_OTHER): Payer: Self-pay | Admitting: Surgery

## 2011-02-01 NOTE — Op Note (Signed)
  [name], [name] NO.:  [id number]  MEDICAL RECORD NO.:  [id number]  LOCATION:  [facility]                         FACILITY:  [facility]  PHYSICIAN:  Cassell Clement, M.D. DATE OF BIRTH:  [birth year]  DATE OF PROCEDURE:  01/14/2011 DATE OF DISCHARGE:  01/14/2011                              OPERATIVE REPORT   HISTORY:  This middle-aged gentleman was recently found to be in atrial fibrillation on a routine postoperative check.  He has been adequately anticoagulated with Pradaxa for the past months.  He now comes in for elective cardioversion.  After 80 mg IV propofol by Dr. Jacklynn Bue, the patient was given a single 120 joules shock using the AP pads.  The patient converted promptly to normal sinus rhythm.  There were no post anesthetic complications.  IMPRESSION:  Successful direct current cardioversion of atrial fibrillation back to normal sinus rhythm.          ______________________________ Cassell Clement, M.D.     TB/MEDQ  D:  01/14/2011  T:  01/15/2011  Job:  [id number]  cc:   [place] Daphine Deutscher, MD  Electronically Signed by Cassell Clement M.D. on 02/01/2011 07:32:04 PM

## 2011-02-03 ENCOUNTER — Telehealth (INDEPENDENT_AMBULATORY_CARE_PROVIDER_SITE_OTHER): Payer: Self-pay | Admitting: General Surgery

## 2011-02-24 ENCOUNTER — Encounter (INDEPENDENT_AMBULATORY_CARE_PROVIDER_SITE_OTHER): Payer: Self-pay | Admitting: Surgery

## 2011-02-24 ENCOUNTER — Ambulatory Visit (INDEPENDENT_AMBULATORY_CARE_PROVIDER_SITE_OTHER): Payer: Medicare Other | Admitting: Surgery

## 2011-02-24 VITALS — Temp 97.1°F

## 2011-02-24 DIAGNOSIS — K5732 Diverticulitis of large intestine without perforation or abscess without bleeding: Secondary | ICD-10-CM

## 2011-02-24 DIAGNOSIS — K5720 Diverticulitis of large intestine with perforation and abscess without bleeding: Secondary | ICD-10-CM

## 2011-02-24 NOTE — Progress Notes (Signed)
Joseph [name] and his wife came in today in followup. His recent air contrast barium enema showed the area of muscular hypertrophy and diverticulosis. No mass was seen. He is doing well. I think we will follow him and not recommend any surgery at this time.  He is getting along  very well. I will be happy to see him again whenever needed.

## 2011-03-19 ENCOUNTER — Ambulatory Visit (INDEPENDENT_AMBULATORY_CARE_PROVIDER_SITE_OTHER): Payer: Medicare Other | Admitting: Cardiology

## 2011-03-19 ENCOUNTER — Encounter: Payer: Self-pay | Admitting: Cardiology

## 2011-03-19 VITALS — BP 120/70 | HR 61 | Wt 158.0 lb

## 2011-03-19 DIAGNOSIS — I4891 Unspecified atrial fibrillation: Secondary | ICD-10-CM

## 2011-03-19 MED ORDER — ASPIRIN EC 81 MG PO TBEC: 81.0000 mg | DELAYED_RELEASE_TABLET | Freq: Every day | ORAL | Status: AC

## 2011-03-19 MED ORDER — DILTIAZEM HCL ER COATED BEADS 120 MG PO CP24: 120.0000 mg | ORAL_CAPSULE | Freq: Every day | ORAL | Status: DC

## 2011-03-19 NOTE — Progress Notes (Signed)
Joseph [name] Date of Birth:  1936-03-13 [facility] / [facility] [address]   [address] [place], [place]  [address] [phone]           Fax   [phone]  HPI: This pleasant 75 year old gentleman has a past history of atrial fibrillation.  His atrial fibrillation was discovered during the postoperative recovery phase after gastrointestinal surgery.  He tolerated the atrial fibrillation well.  Once he was found to be in atrial fibrillation we put him on Pradaxa M.D. Was subsequently successfully cardioverted on January 14, 2011.  He has remained on diltiazem and Pradaxa and comes in now for followup.  He said no chest pain or shortness of breath.  His energy level has been good.  Laparotomy palpitations.  Current Outpatient Prescriptions  Medication Sig Dispense Refill  . diltiazem (CARDIZEM CD) 120 MG 24 hr capsule Take 1 capsule (120 mg total) by mouth daily.  30 capsule  11    No Known Allergies  Patient Active Problem List  Diagnoses  . Atrial fibrillation with rapid ventricular response  . Diverticulitis of colon with perforation    History  Smoking status  . Never Smoker   Smokeless tobacco  . Never Used    History  Alcohol Use No    No family history on file.  Review of Systems: The patient denies any heat or cold intolerance.  No weight gain or weight loss.  The patient denies headaches or blurry vision.  There is no cough or sputum production.  The patient denies dizziness.  There is no hematuria or hematochezia.  The patient denies any muscle aches or arthritis.  The patient denies any rash.  The patient denies frequent falling or instability.  There is no history of depression or anxiety.  All other systems were reviewed and are negative.   Physical Exam: Filed Vitals:   03/19/11 1140  BP: 120/70  Pulse: 61  The general appearance reveals a well-developed well-nourished gentleman in no distress.The head and neck exam reveals pupils equal and  reactive.  Extraocular movements are full.  There is no scleral icterus.  The mouth and pharynx are normal.  The neck is supple.  The carotids reveal no bruits.  The jugular venous pressure is normal.  The  thyroid is not enlarged.  There is no lymphadenopathy.  The chest is clear to percussion and auscultation.  There are no rales or rhonchi.  Expansion of the chest is symmetrical.  The precordium is quiet.  The first heart sound is normal.  The second heart sound is physiologically split.  There is no murmur gallop rub or click.  There is no abnormal lift or heave.  The abdomen is soft and nontender.  The bowel sounds are normal.  The liver and spleen are not enlarged.  There are no abdominal masses.  There are no abdominal bruits.  Extremities reveal good pedal pulses.  There is no phlebitis or edema.  There is no cyanosis or clubbing.  Strength is normal and symmetrical in all extremities.  There is no lateralizing weakness.  There are no sensory deficits.  The skin is warm and dry.  There is no rash.  EKG today shows normal sinus rhythm and is within normal limits    Assessment / Plan: At this point we will stop his Pradaxa and we will reduce his diltiazem to just 120 mg once a day.  We will see him in 3 months for followup office visit and  EKG and at that point consider stopping the diltiazem altogether.  In the place of Pradaxa he will be on aspirin 81 mg daily

## 2011-03-19 NOTE — Assessment & Plan Note (Signed)
Patient has had no further palpitations since cardioversion 2 months ago.  He's not had any bleeding problems from the Pradaxa

## 2011-03-22 ENCOUNTER — Encounter: Payer: Self-pay | Admitting: Cardiology

## 2011-06-28 ENCOUNTER — Encounter: Payer: Self-pay | Admitting: Cardiology

## 2011-06-28 ENCOUNTER — Ambulatory Visit (INDEPENDENT_AMBULATORY_CARE_PROVIDER_SITE_OTHER): Payer: Medicare Other | Admitting: Cardiology

## 2011-06-28 VITALS — BP 118/78 | HR 78 | Ht 67.0 in | Wt 161.0 lb

## 2011-06-28 DIAGNOSIS — I4891 Unspecified atrial fibrillation: Secondary | ICD-10-CM

## 2011-06-28 DIAGNOSIS — I480 Paroxysmal atrial fibrillation: Secondary | ICD-10-CM

## 2011-06-28 NOTE — Patient Instructions (Signed)
Will stop Diltiazem and call us if your heart rate starts to go up Your physician wants you to follow-up in: 6 months You will receive a reminder letter in the mail two months in advance. If you don't receive a letter, please call our office to schedule the follow-up appointment.

## 2011-06-28 NOTE — Assessment & Plan Note (Signed)
The patient is not expressing any chest pain or shortness of breath.  He has not been experiencing any palpitations.  He checks his blood pressure and pulses day and they have been stable at home.  The patient had successful direct-current cardioversion on 01/14/2011.  He has remained on diltiazem but when we saw him in August we stopped his Pradaxa and continued him on aspirin alone.

## 2011-06-28 NOTE — Progress Notes (Signed)
Joseph [name] Date of Birth:  [birth year] [facility] Cardiology / [facility] [address]   [address] [place], [place]  [address] [phone]           Fax   [phone]  HPI: This pleasant 75 year old gentleman is seen for a scheduled followup office visit.  He had a past history of paroxysmal atrial fibrillation.  His fibrillation was discovered during a postoperative visit to his gastrointestinal surgeon Dr. Wenda Low.  The patient himself was totally unaware that his heart was out of rhythm.  At the time he was not experiencing any chest pain or shortness of breath.  He continues to be asymptomatic.  Current Outpatient Prescriptions  Medication Sig Dispense Refill  . aspirin EC 81 MG tablet Take 1 tablet (81 mg total) by mouth daily.  30 tablet  prn    No Known Allergies  Patient Active Problem List  Diagnoses  . Atrial fibrillation with rapid ventricular response  . Diverticulitis of colon with perforation    History  Smoking status  . Never Smoker   Smokeless tobacco  . Never Used    History  Alcohol Use No    No family history on file.  Review of Systems: The patient denies any heat or cold intolerance.  No weight gain or weight loss.  The patient denies headaches or blurry vision.  There is no cough or sputum production.  The patient denies dizziness.  There is no hematuria or hematochezia.  The patient denies any muscle aches or arthritis.  The patient denies any rash.  The patient denies frequent falling or instability.  There is no history of depression or anxiety.  All other systems were reviewed and are negative.   Physical Exam: Filed Vitals:   06/28/11 0918  BP: 118/78  Pulse: 78  The head and neck exam reveals pupils equal and reactive.  Extraocular movements are full.  There is no scleral icterus.  The mouth and pharynx are normal.  The neck is supple.  The carotids reveal no bruits.  The jugular venous pressure is normal.  The  thyroid is not  enlarged.  There is no lymphadenopathy.  The chest is clear to percussion and auscultation.  There are no rales or rhonchi.  Expansion of the chest is symmetrical.  The precordium is quiet.  The first heart sound is normal.  The second heart sound is physiologically split.  There is no murmur gallop rub or click.  There is no abnormal lift or heave.  The abdomen is soft and nontender.  The bowel sounds are normal.  The liver and spleen are not enlarged.  There are no abdominal masses.  There are no abdominal bruits.  Extremities reveal good pedal pulses.  There is no phlebitis or edema.  There is no cyanosis or clubbing.  Strength is normal and symmetrical in all extremities.  There is no lateralizing weakness.  There are no sensory deficits.  The skin is warm and dry.  There is no rash.      Assessment / Plan:  At this (he has had no further atrial fibrillation we will attribute it to the stress of his gastroenterologic surgery and we will stop his diltiazem at this time and continue on just baby aspirin.  He will return in 6 months for followup office visit and EKG.  He does check his past when he checks his blood pressure each day and he keeps a record of his vital signs.  If  he notes that his pulse is rapid or irregular he is to notify us right away

## 2011-07-07 ENCOUNTER — Encounter: Payer: Self-pay | Admitting: Cardiology

## 2011-12-17 ENCOUNTER — Ambulatory Visit: Payer: Medicare Other | Admitting: Cardiology

## 2011-12-22 ENCOUNTER — Ambulatory Visit (INDEPENDENT_AMBULATORY_CARE_PROVIDER_SITE_OTHER): Payer: Medicare Other | Admitting: Cardiology

## 2011-12-22 ENCOUNTER — Encounter: Payer: Self-pay | Admitting: Cardiology

## 2011-12-22 VITALS — BP 120/70 | HR 72 | Ht 67.0 in | Wt 163.0 lb

## 2011-12-22 DIAGNOSIS — I480 Paroxysmal atrial fibrillation: Secondary | ICD-10-CM | POA: Insufficient documentation

## 2011-12-22 DIAGNOSIS — I4891 Unspecified atrial fibrillation: Secondary | ICD-10-CM

## 2011-12-22 NOTE — Patient Instructions (Signed)
Your physician recommends that you continue on your current medications as directed. Please refer to the Current Medication list given to you today.  Your physician wants you to follow-up in: 6 months. You will receive a reminder letter in the mail two months in advance. If you don't receive a letter, please call our office to schedule the follow-up appointment.  

## 2011-12-22 NOTE — Assessment & Plan Note (Signed)
The patient has had no recurrent episodes of atrial fibrillation that he is aware of.  His only medication now is daily 81 mg aspirin.

## 2011-12-22 NOTE — Progress Notes (Signed)
  [name] Date of Birth:  09-17-1935 [facility] [address] [place], [place]  [address] [phone]  Fax   [phone]  HPI: This pleasant 76 year old gentleman is seen for a six-month followup office visit.  He has a past history of paroxysmal atrial fibrillation.  It was discovered during a postoperative checkup.  The patient is not aware of his rhythm.  He has been feeling well.  He plays tennis 3 or 4 times a week.  He said the chest pain or shortness of breath.  When he checks his heart rate on his blood pressure machine it has been stable in the range of 70  Current Outpatient Prescriptions  Medication Sig Dispense Refill  . aspirin EC 81 MG tablet Take 1 tablet (81 mg total) by mouth daily.  30 tablet  prn    No Known Allergies  Patient Active Problem List  Diagnoses  . Atrial fibrillation with rapid ventricular response  . Diverticulitis of colon with perforation  . PAF (paroxysmal atrial fibrillation)    History  Smoking status  . Never Smoker   Smokeless tobacco  . Never Used    History  Alcohol Use No    No family history on file.  Review of Systems: The patient denies any heat or cold intolerance.  No weight gain or weight loss.  The patient denies headaches or blurry vision.  There is no cough or sputum production.  The patient denies dizziness.  There is no hematuria or hematochezia.  The patient denies any muscle aches or arthritis.  The patient denies any rash.  The patient denies frequent falling or instability.  There is no history of depression or anxiety.  All other systems were reviewed and are negative.   Physical Exam: Filed Vitals:   12/22/11 0932  BP: 120/70  Pulse: 72   the general appearance reveals a healthy-appearing middle-aged gentleman in no distress.The head and neck exam reveals pupils equal and reactive.  Extraocular movements are full.  There is no scleral icterus.  The mouth and pharynx are normal.  The  neck is supple.  The carotids reveal no bruits.  The jugular venous pressure is normal.  The  thyroid is not enlarged.  There is no lymphadenopathy.  The chest is clear to percussion and auscultation.  There are no rales or rhonchi.  Expansion of the chest is symmetrical.  The precordium is quiet.  The first heart sound is normal.  The second heart sound is physiologically split.  There is no murmur gallop rub or click.  There is no abnormal lift or heave.  The abdomen is soft and nontender.  The bowel sounds are normal.  The liver and spleen are not enlarged.  There are no abdominal masses.  There are no abdominal bruits.  Extremities reveal good pedal pulses.  There is no phlebitis or edema.  There is no cyanosis or clubbing.  Strength is normal and symmetrical in all extremities.  There is no lateralizing weakness.  There are no sensory deficits.  The skin is warm and dry.  There is no rash.   EKG today shows normal sinus rhythm and is within normal limits   Assessment / Plan: Continue on daily aspirin.  Recheck in 6 months for followup office visit and EKG

## 2012-05-01 ENCOUNTER — Encounter: Payer: Self-pay | Admitting: Cardiology

## 2012-06-21 ENCOUNTER — Encounter: Payer: Self-pay | Admitting: Cardiology

## 2012-06-21 ENCOUNTER — Ambulatory Visit (INDEPENDENT_AMBULATORY_CARE_PROVIDER_SITE_OTHER): Payer: Medicare Other | Admitting: Cardiology

## 2012-06-21 VITALS — BP 136/83 | HR 68 | Ht 68.4 in | Wt 161.0 lb

## 2012-06-21 DIAGNOSIS — I480 Paroxysmal atrial fibrillation: Secondary | ICD-10-CM

## 2012-06-21 DIAGNOSIS — I4891 Unspecified atrial fibrillation: Secondary | ICD-10-CM

## 2012-06-21 DIAGNOSIS — M26629 Arthralgia of temporomandibular joint, unspecified side: Secondary | ICD-10-CM

## 2012-06-21 NOTE — Progress Notes (Signed)
  Joseph [name] Date of Birth:  1936/06/17 [facility] [address] [place], [place]  [address] [phone]  Fax   [phone]  HPI: This pleasant 76 year old gentleman is seen for a six-month followup office visit. He has a past history of paroxysmal atrial fibrillation. It was discovered during a postoperative checkup. The patient is not aware of his rhythm.  He underwent successful direct-current cardioversion on 01/14/11.  He has had no recurrent atrial fibrillation that he is aware of.  He has been feeling well. He plays tennis 3 or 4 times a week. He has had no chest pain or shortness of breath. When he checks his heart rate on his blood pressure machine it has been stable in the range of 70   Current Outpatient Prescriptions  Medication Sig Dispense Refill  . aspirin 81 MG tablet Take 81 mg by mouth daily.        No Known Allergies  Patient Active Problem List  Diagnosis  . Atrial fibrillation with rapid ventricular response  . Diverticulitis of colon with perforation  . PAF (paroxysmal atrial fibrillation)    History  Smoking status  . Never Smoker   Smokeless tobacco  . Never Used    History  Alcohol Use No    No family history on file.  Review of Systems: The patient denies any heat or cold intolerance.  No weight gain or weight loss.  The patient denies headaches or blurry vision.  There is no cough or sputum production.  The patient denies dizziness.  There is no hematuria or hematochezia.  The patient denies any muscle aches or arthritis.  The patient denies any rash.  The patient denies frequent falling or instability.  There is no history of depression or anxiety.  All other systems were reviewed and are negative.   Physical Exam: Filed Vitals:   06/21/12 1113  BP: 136/83  Pulse: 68   the general appearance reveals a well-developed well-nourished gentleman in no distress.The head and neck exam reveals pupils equal and reactive.   Extraocular movements are full.  There is no scleral icterus.  The mouth and pharynx are normal.  The neck is supple.  The carotids reveal no bruits.  The jugular venous pressure is normal.  The  thyroid is not enlarged.  There is no lymphadenopathy.  The chest is clear to percussion and auscultation.  There are no rales or rhonchi.  Expansion of the chest is symmetrical.  The precordium is quiet.  The first heart sound is normal.  The second heart sound is physiologically split.  There is no murmur gallop rub or click.  There is no abnormal lift or heave.  The abdomen is soft and nontender.  The bowel sounds are normal.  The liver and spleen are not enlarged.  There are no abdominal masses.  There are no abdominal bruits.  Extremities reveal good pedal pulses.  There is no phlebitis or edema.  There is no cyanosis or clubbing.  Strength is normal and symmetrical in all extremities.  There is no lateralizing weakness.  There are no sensory deficits.  The skin is warm and dry.  There is no rash.   EKG shows normal sinus rhythm at 66 per minute   Assessment / Plan: Continue aspirin every other day while taking Naprosyn for his TMJ. Recheck here in 6 months for followup office visit and EKG

## 2012-06-21 NOTE — Assessment & Plan Note (Signed)
The patient had some dental work with a dental implant since we last saw him.  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  He wondered about trying some Naprosyn since Tylenol is not effective.  While he is on the Naprosyn he will decrease his baby aspirin to every other day.  He has had diverticular bleeding in the past.

## 2012-06-21 NOTE — Assessment & Plan Note (Signed)
The patient has had no recurrent atrial fibrillation.  He is on no medication except for a baby aspirin daily since he is unaware of his heart rhythm.

## 2012-06-21 NOTE — Patient Instructions (Addendum)
Your physician recommends that you continue on your current medications as directed. Please refer to the Current Medication list given to you today.  Your physician wants you to follow-up in: 6 months. You will receive a reminder letter in the mail two months in advance. If you don't receive a letter, please call our office to schedule the follow-up appointment.  

## 2012-07-13 IMAGING — CT CT ABD-PELV W/ CM
2 of 5 series · 17 of 46 positions shown, 19 images · IV contrast (APPLIED)
Comparison: 08/08/2007

CLINICAL DATA: Right lower quadrant pain.  Fever and chills.

CT ABDOMEN AND PELVIS WITH CONTRAST
TECHNIQUE: Multidetector CT imaging of the abdomen and pelvis was
performed following the standard protocol during bolus
administration of intravenous contrast.
Contrast: 80 ml 2mnipaque-ZRR and oral contrast

[Series 2: abd/pelv with 5.0 b31f st · axial · 0.80mm/px · z∈[-513,-98]mm · 14 of 93 slices shown, 16 images]
[im 5/93  soft-tissue]
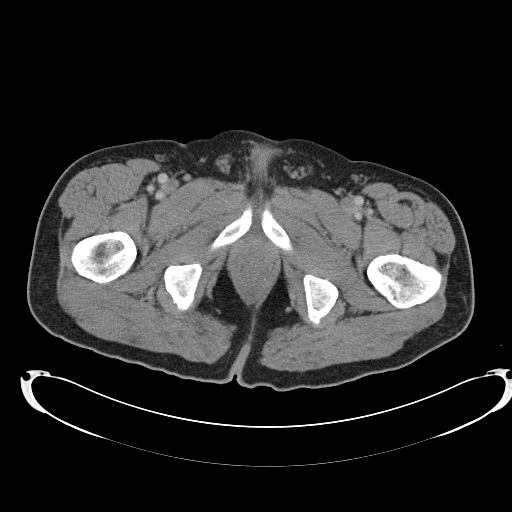
[im 5/93  bone]
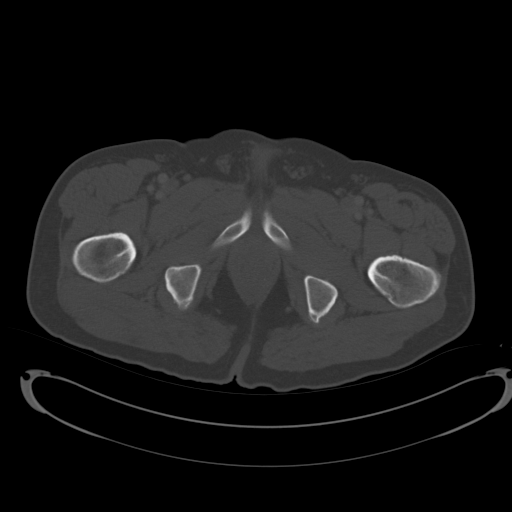
[im 14/93  soft-tissue]
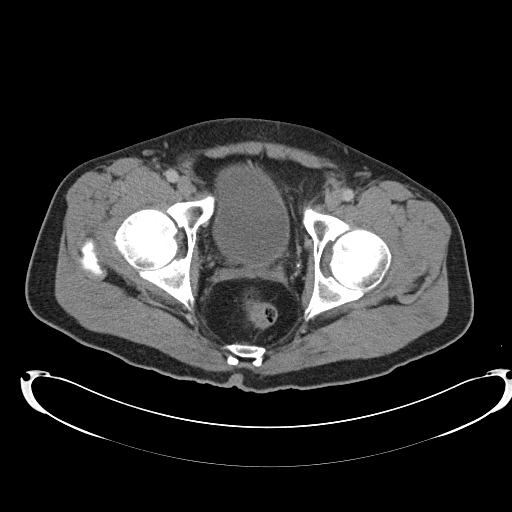
[im 18/93  soft-tissue]
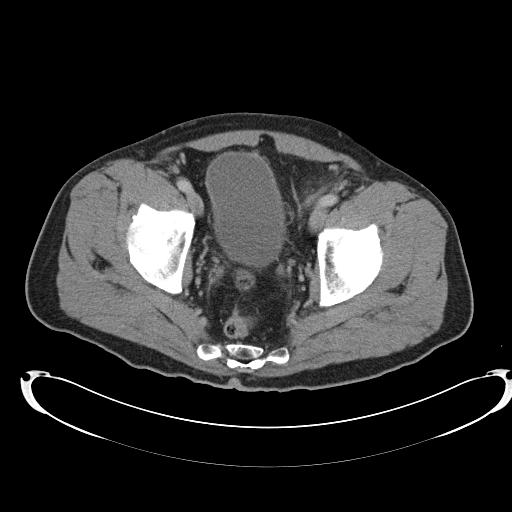
[im 27/93  soft-tissue]
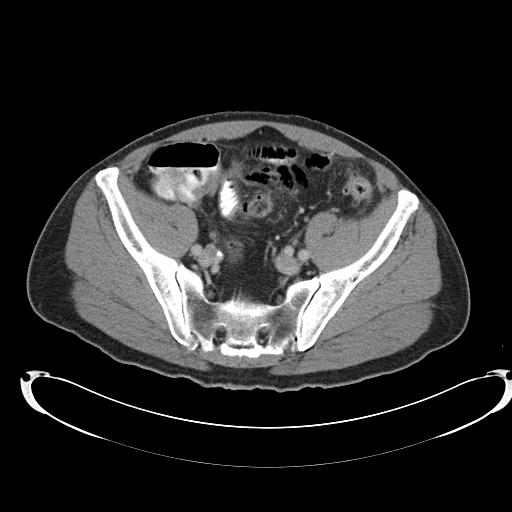
[im 31/93  soft-tissue]
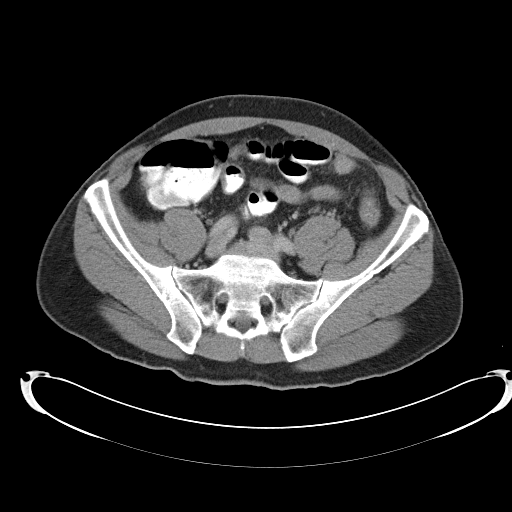
[im 36/93  soft-tissue]
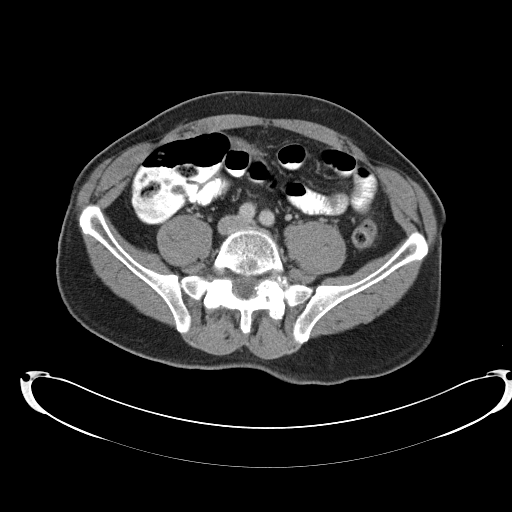
[im 44/93  soft-tissue]
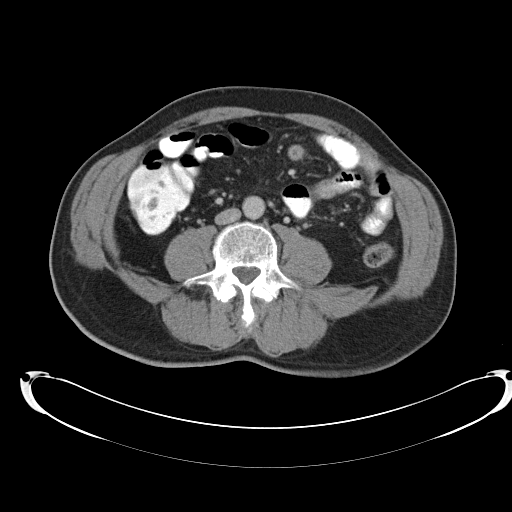
[im 49/93  soft-tissue]
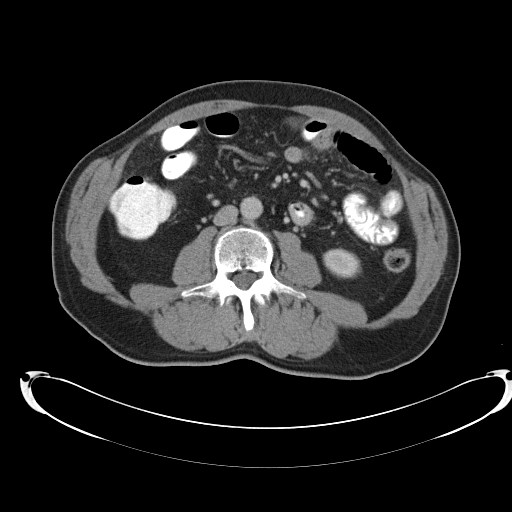
[im 57/93  soft-tissue]
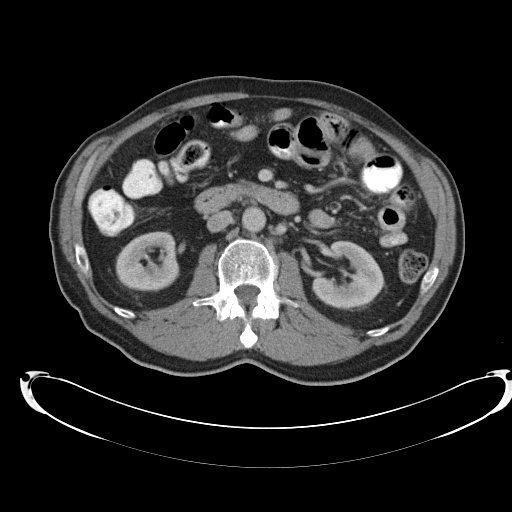
[im 57/93  bone]
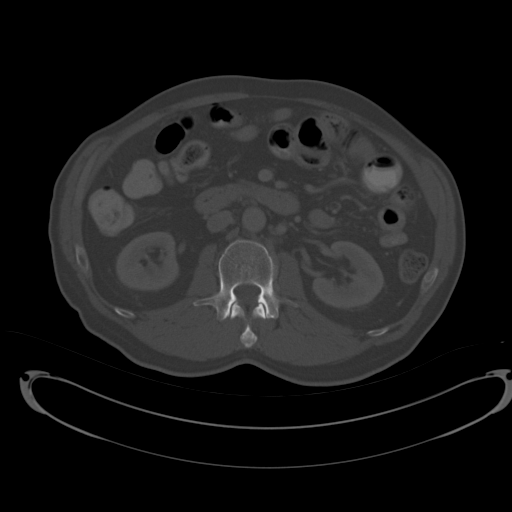
[im 62/93  soft-tissue]
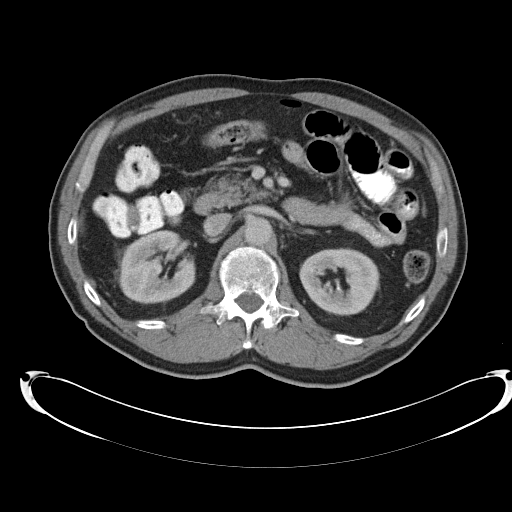
[im 71/93  soft-tissue]
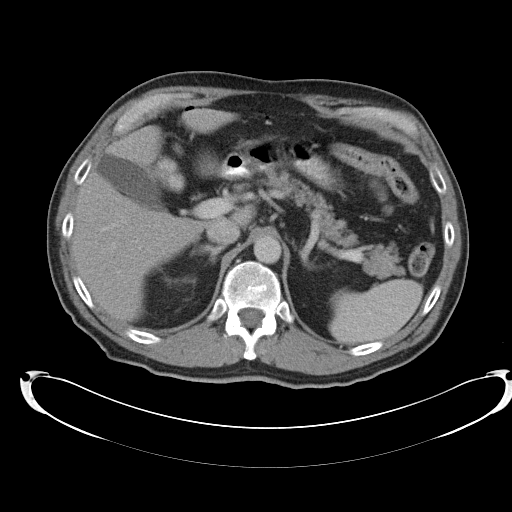
[im 75/93  soft-tissue]
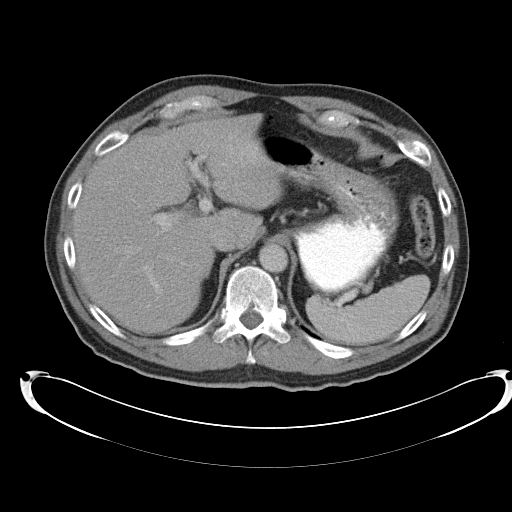
[im 79/93  soft-tissue]
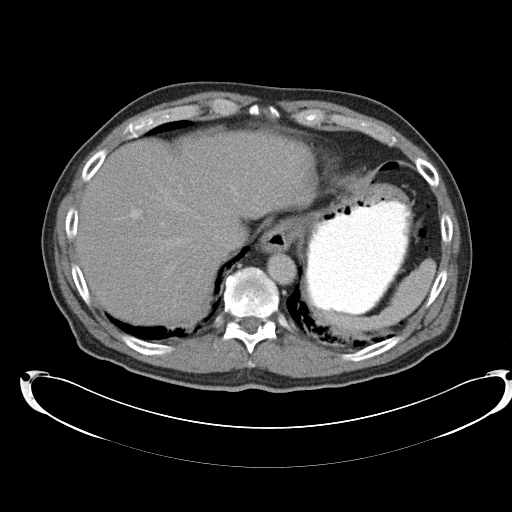
[im 88/93  soft-tissue]
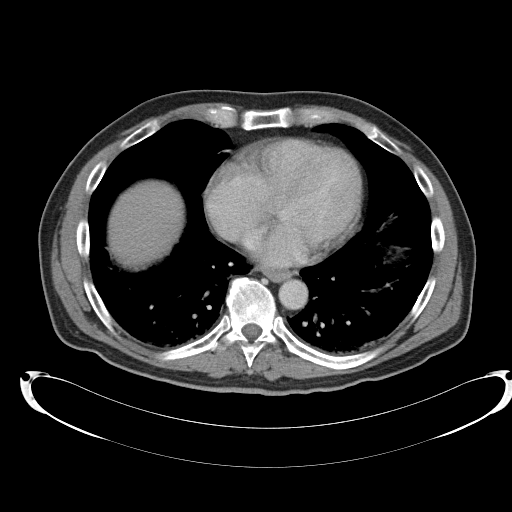

[Series 5: abd/pelv with 2.0 cor · coronal · 0.91mm/px · 3 of 111 slices shown]
[im 37/111  soft-tissue]
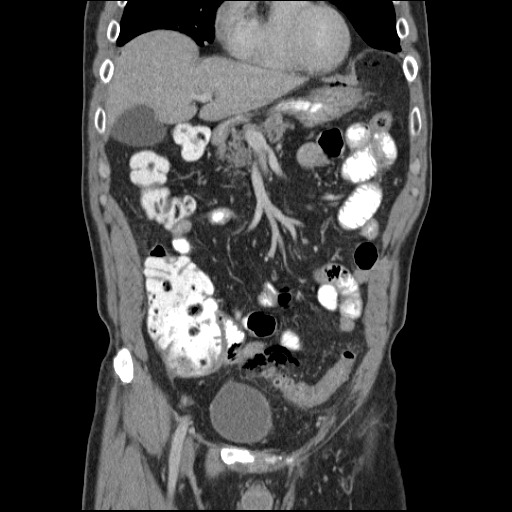
[im 49/111  soft-tissue]
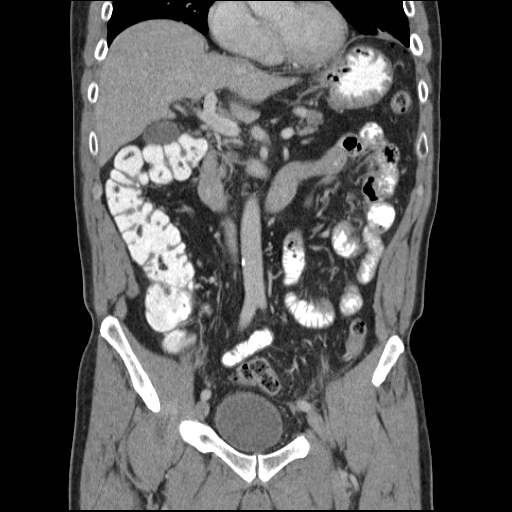
[im 62/111  soft-tissue]
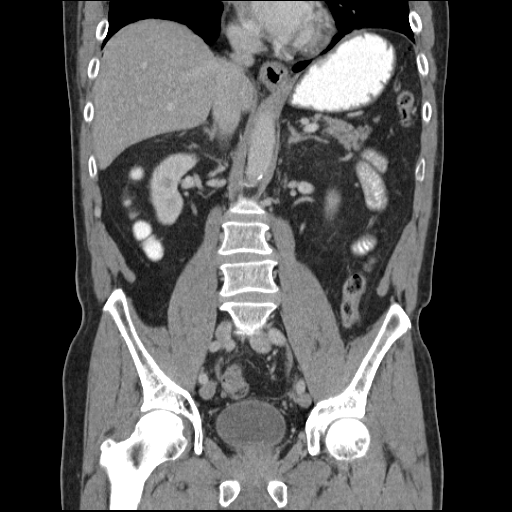

[17 of 46 positions shown; findings below may reference images not displayed]

FINDINGS: Images through the lung bases show mild bibasilar
atelectasis.  A small amount of free intraperitoneal air is noted.
Extraluminal air is also seen in the sigmoid mesocolon where there
is mild diverticulitis involving the proximal sigmoid colon.  No
evidence of abscess or free fluid.

The liver, gallbladder, spleen, pancreas, and adrenal glands are
normal in appearance.  Tiny left upper pole renal cyst is seen,
however there is no evidence of renal mass or hydronephrosis.  No
soft tissue masses or lymphadenopathy identified.  No evidence of
bowel obstruction.
IMPRESSION: 1.  Perforated sigmoid diverticulitis, with small amount of free
intraperitoneal air.
2.  No evidence of abscess or bowel obstruction.
3.  Bibasilar atelectasis.

Critical test results telephoned to Dr. Tiger in the emergency
department at the time of interpretation on 11/24/2010 at 2329
hours.

## 2012-07-13 IMAGING — CR DG ABDOMEN ACUTE W/ 1V CHEST
3 series · 3 of 3 positions shown · non-contrast
Comparison: None.

CLINICAL DATA: Abdominal pain, nausea and vomiting.  Fever.

ACUTE ABDOMEN SERIES (ABDOMEN 2 VIEW & CHEST 1 VIEW)

[w chest pa]
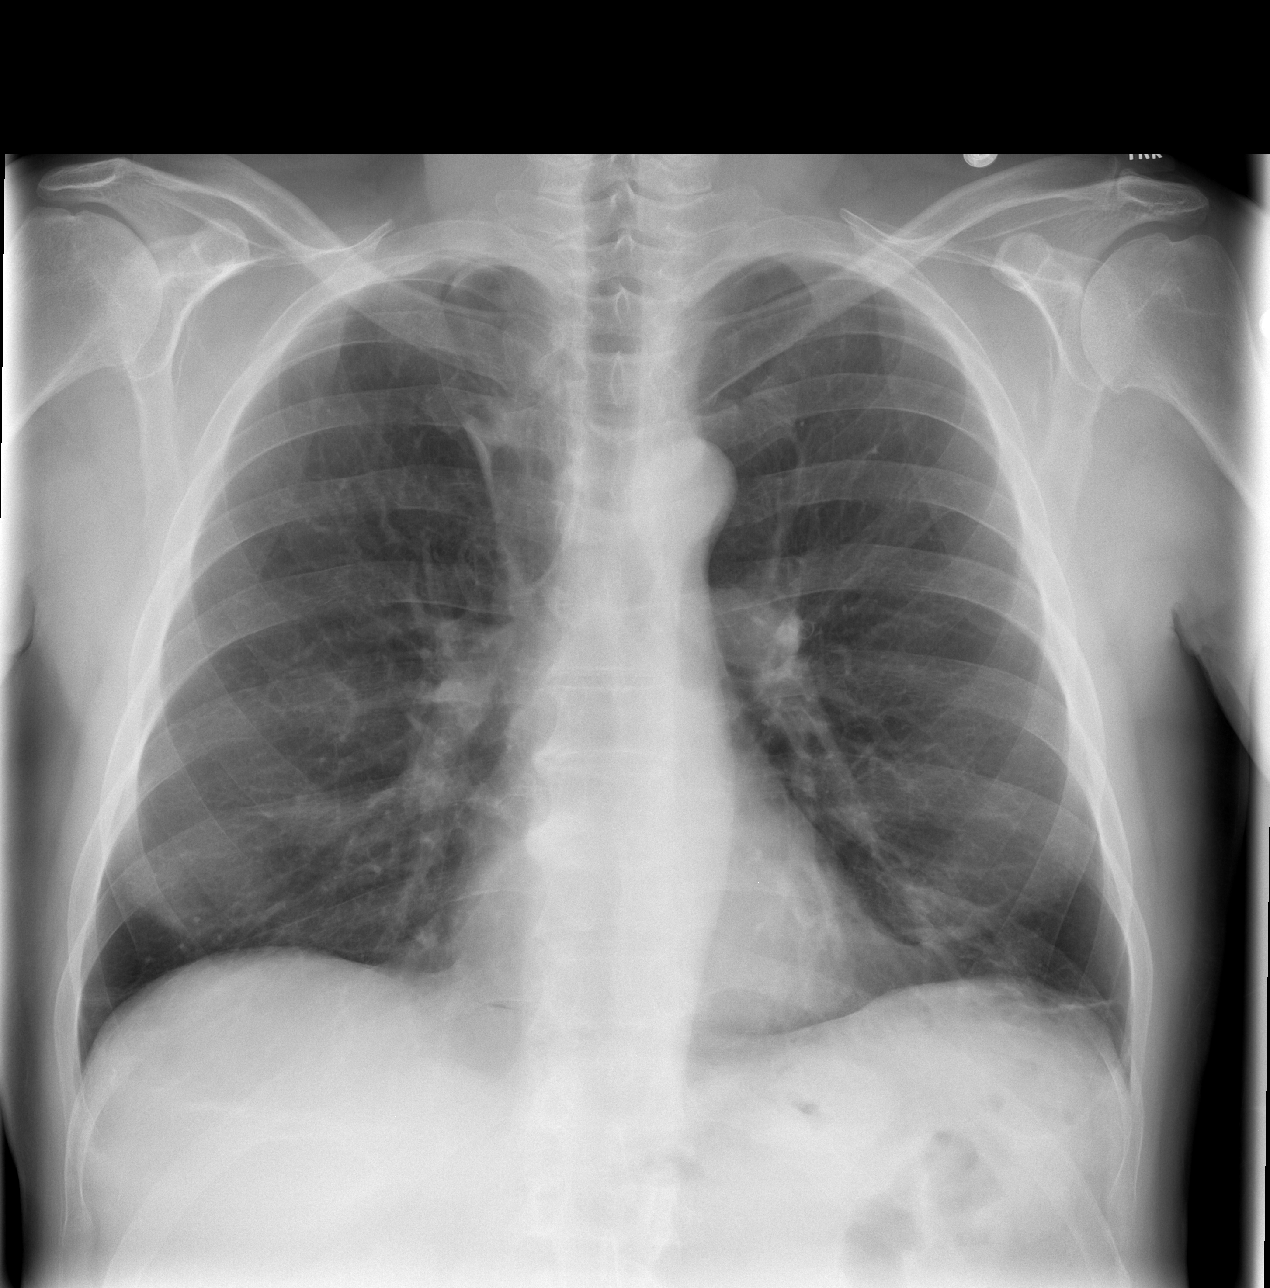

[w abdomen upright]
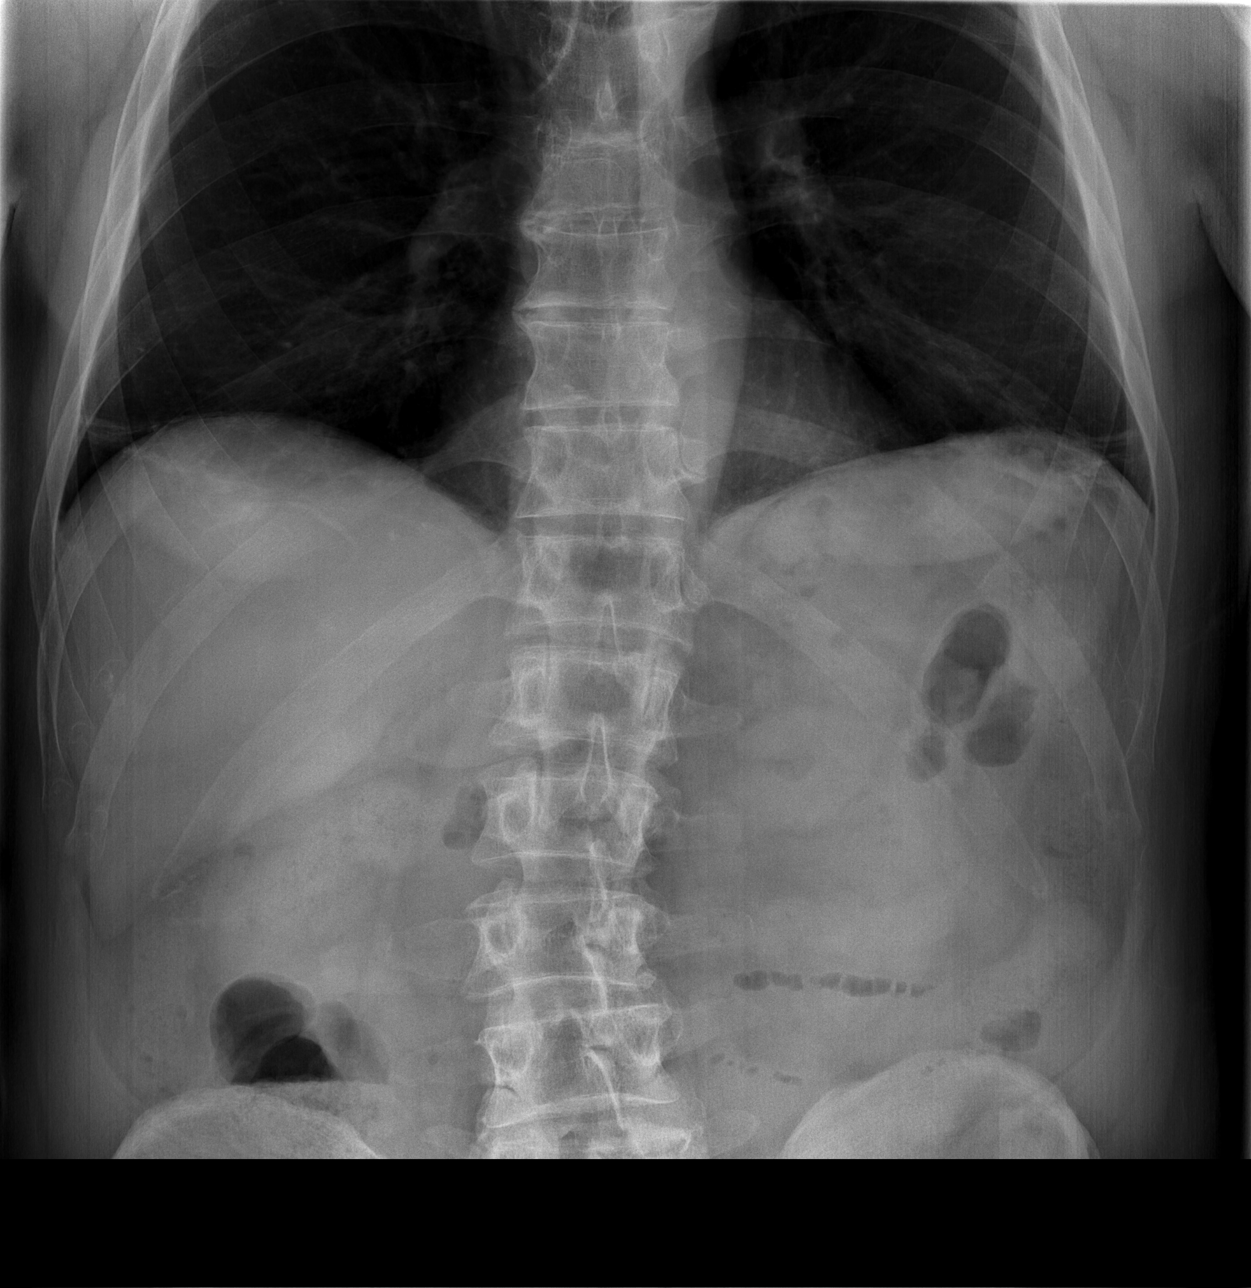

[t abdomen supine]
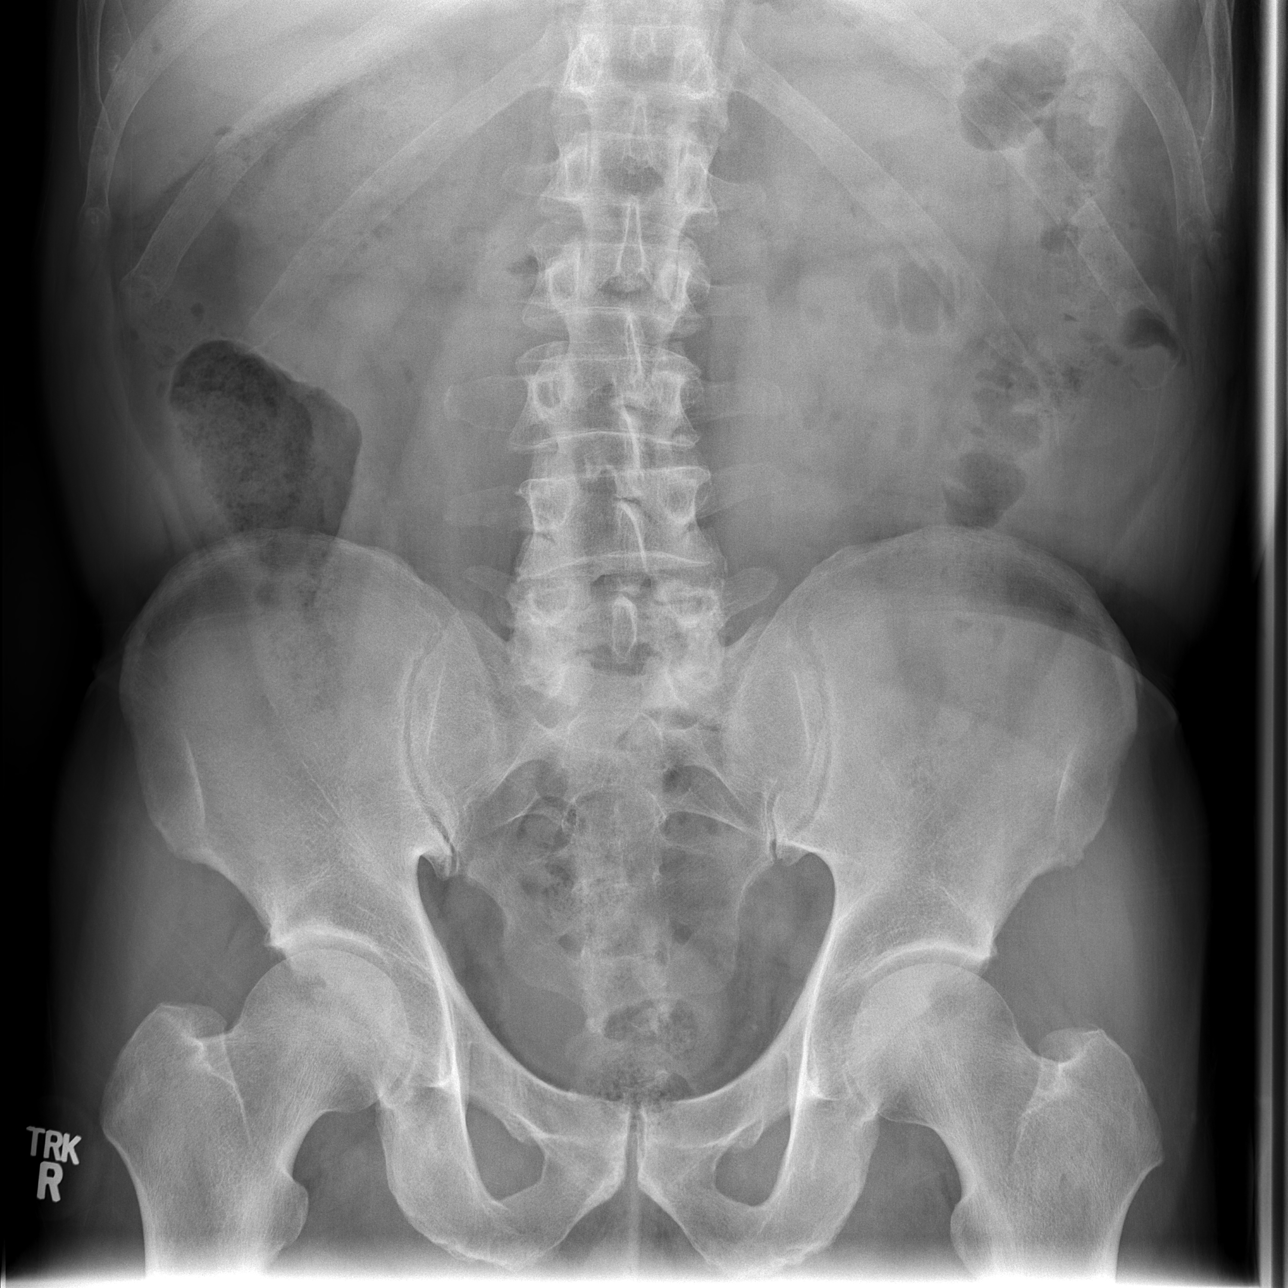

[3 of 3 positions shown; findings below may reference images not displayed]

FINDINGS: Frontal view of the chest shows midline trachea and
normal heart size.  Azygos fissure is incidentally noted. Bibasilar
scarring.  Lungs are otherwise clear.  No pleural fluid.

Two views of the abdomen show minimal gaseous prominence small
bowel in the left abdomen.  Gas and stool are scattered in the
colon.  A mildly dilated loop of small bowel is seen in the right
lower quadrant with an air-fluid level.
IMPRESSION: Bowel gas pattern is somewhat nonspecific and may be due to
gastroenteritis. An early partial small bowel obstruction cannot be
definitively excluded.

## 2012-09-23 ENCOUNTER — Other Ambulatory Visit: Payer: Self-pay

## 2013-01-18 ENCOUNTER — Encounter: Payer: Self-pay | Admitting: Cardiology

## 2013-01-18 ENCOUNTER — Ambulatory Visit (INDEPENDENT_AMBULATORY_CARE_PROVIDER_SITE_OTHER): Payer: Medicare Other | Admitting: Cardiology

## 2013-01-18 VITALS — BP 130/74 | HR 64 | Ht 67.0 in | Wt 158.4 lb

## 2013-01-18 DIAGNOSIS — I4891 Unspecified atrial fibrillation: Secondary | ICD-10-CM

## 2013-01-18 DIAGNOSIS — I480 Paroxysmal atrial fibrillation: Secondary | ICD-10-CM

## 2013-01-18 NOTE — Assessment & Plan Note (Signed)
The patient has not been aware of any palpitations or tachycardia.  No chest pain or shortness of breath.  He plays both singles and doubles tennis and has good stamina and has not been aware of any increased dyspnea and has had no chest pains.  He and his wife just recently returned from a trip to [place] and in July they will be taking a cruise to [place].

## 2013-01-18 NOTE — Patient Instructions (Addendum)
Your physician recommends that you continue on your current medications as directed. Please refer to the Current Medication list given to you today.  Your physician wants you to follow-up in: 1 year ov/ekg You will receive a reminder letter in the mail two months in advance. If you don't receive a letter, please call our office to schedule the follow-up appointment.  

## 2013-01-18 NOTE — Progress Notes (Signed)
Joseph [name] Date of Birth:  [birth year] [facility] [address] [place], [place]  [address] [phone]  Fax   [phone]  HPI: Joseph [name] is a pleasant 77 year old gentleman who is seen for a six-month followup office visit. He has a past history of paroxysmal atrial fibrillation. It was discovered during a postoperative checkup. The patient is not aware of his rhythm. He underwent successful direct-current cardioversion on 01/14/11. He has had no recurrent atrial fibrillation that he is aware of. He has been feeling well. He plays tennis 3 or 4 times a week. He has had no chest pain or shortness of breath. When he checks his heart rate on his blood pressure machine it has been stable in the range of 70   Current Outpatient Prescriptions  Medication Sig Dispense Refill  . acetaminophen (TYLENOL) 500 MG tablet Take 500 mg by mouth as needed for pain.      Marland Kitchen aspirin 81 MG tablet Take 81 mg by mouth daily.      . naproxen sodium (ANAPROX) 220 MG tablet Take 220 mg by mouth as needed.       No current facility-administered medications for this visit.    No Known Allergies  Patient Active Problem List   Diagnosis Date Noted  . TMJ arthralgia 06/21/2012  . PAF (paroxysmal atrial fibrillation) 12/22/2011  . Atrial fibrillation with rapid ventricular response 12/11/2010  . Diverticulitis of colon with perforation 12/11/2010    History  Smoking status  . Never Smoker   Smokeless tobacco  . Never Used    History  Alcohol Use No    No family history on file.  Review of Systems: The patient denies any heat or cold intolerance.  No weight gain or weight loss.  The patient denies headaches or blurry vision.  There is no cough or sputum production.  The patient denies dizziness.  There is no hematuria or hematochezia.  The patient denies any muscle aches or arthritis.  The patient denies any rash.  The patient denies frequent falling or instability.  There is  no history of depression or anxiety.  All other systems were reviewed and are negative.   Physical Exam: Filed Vitals:   01/18/13 0932  BP: 130/74  Pulse: 64   the general appearance reveals a well-developed well-nourished middle-aged gentleman in no distress.The head and neck exam reveals pupils equal and reactive.  Extraocular movements are full.  There is no scleral icterus.  The mouth and pharynx are normal.  The neck is supple.  The carotids reveal no bruits.  The jugular venous pressure is normal.  The  thyroid is not enlarged.  There is no lymphadenopathy.  The chest is clear to percussion and auscultation.  There are no rales or rhonchi.  Expansion of the chest is symmetrical.  The precordium is quiet.  The first heart sound is normal.  The second heart sound is physiologically split.  There is no murmur gallop rub or click.  There is no abnormal lift or heave.  The abdomen is soft and nontender.  The bowel sounds are normal.  The liver and spleen are not enlarged.  There are no abdominal masses.  There are no abdominal bruits.  Extremities reveal good pedal pulses.  There is no phlebitis or edema.  There is no cyanosis or clubbing.  Strength is normal and symmetrical in all extremities.  There is no lateralizing weakness.  There are no sensory deficits.  The skin is warm  and dry.  There is no rash.  EKG shows normal sinus rhythm and is within normal limits.    Assessment / Plan: Continue taking a baby aspirin daily.  Rechecked at yearly intervals for office visit and EKG, or sooner when necessary.  He will get his annual physical examination and his cholesterol checked with his PCP

## 2013-03-14 ENCOUNTER — Other Ambulatory Visit: Payer: Self-pay

## 2013-06-14 ENCOUNTER — Other Ambulatory Visit: Payer: Self-pay

## 2014-01-21 ENCOUNTER — Ambulatory Visit: Payer: Medicare Other | Admitting: Cardiology

## 2014-01-22 ENCOUNTER — Ambulatory Visit (INDEPENDENT_AMBULATORY_CARE_PROVIDER_SITE_OTHER): Payer: Medicare Other | Admitting: Cardiology

## 2014-01-22 ENCOUNTER — Encounter: Payer: Self-pay | Admitting: Cardiology

## 2014-01-22 VITALS — BP 133/70 | HR 75 | Ht 67.0 in | Wt 155.0 lb

## 2014-01-22 DIAGNOSIS — I480 Paroxysmal atrial fibrillation: Secondary | ICD-10-CM

## 2014-01-22 DIAGNOSIS — I4891 Unspecified atrial fibrillation: Secondary | ICD-10-CM

## 2014-01-22 NOTE — Assessment & Plan Note (Signed)
The patient has had no recurrent atrial fibrillation.  He has had no dizziness.  No TIA symptoms

## 2014-01-22 NOTE — Patient Instructions (Signed)
Your physician wants you to follow-up in: [place] will receive a reminder letter in the mail two months in advance. If you don't receive a letter, please call our office to schedule the follow-up appointment.

## 2014-01-22 NOTE — Progress Notes (Signed)
Joseph [name] Date of Birth:  Mar 26, 1936 [address] [place], [place]  [address] [phone]  Fax   [phone]  HPI: This pleasant 78 year old gentleman is seen for a one year followup office visit.  He has a past history of paroxysmal atrial fibrillation which was discovered during a postoperative checkup.  On 01/14/11 he underwent successful direct-current cardioversion.  The patient has had no recurrent atrial fibrillation.  He is physically active and plays tennis 2 or 3 times a week.  His only medication is aspirin 81 mg every other day.  Current Outpatient Prescriptions  Medication Sig Dispense Refill  . acetaminophen (TYLENOL) 500 MG tablet Take 500 mg by mouth as needed for pain.      Marland Kitchen aspirin 81 MG tablet Take 81 mg by mouth every other day.       . naproxen sodium (ANAPROX) 220 MG tablet Take 220 mg by mouth as needed.       No current facility-administered medications for this visit.    No Known Allergies  Patient Active Problem List   Diagnosis Date Noted  . TMJ arthralgia 06/21/2012  . PAF (paroxysmal atrial fibrillation) 12/22/2011  . Atrial fibrillation with rapid ventricular response 12/11/2010  . Diverticulitis of colon with perforation 12/11/2010    History  Smoking status  . Never Smoker   Smokeless tobacco  . Never Used    History  Alcohol Use No    No family history on file.  Review of Systems: The patient denies any heat or cold intolerance.  No weight gain or weight loss.  The patient denies headaches or blurry vision.  There is no cough or sputum production.  The patient denies dizziness.  There is no hematuria or hematochezia.  The patient denies any muscle aches or arthritis.  The patient denies any rash.  The patient denies frequent falling or instability.  There is no history of depression or anxiety.  All other systems were reviewed and are negative.   Physical Exam: Filed Vitals:   01/22/14 1609  BP:  133/70  Pulse: 75   the general appearance reveals a well-developed well-nourished gentleman in no distress. The head and neck exam reveals pupils equal and reactive.  Extraocular movements are full.  There is no scleral icterus.  The mouth and pharynx are normal.  The neck is supple.  The carotids reveal no bruits.  The jugular venous pressure is normal.  The  thyroid is not enlarged.  There is no lymphadenopathy.  The chest is clear to percussion and auscultation.  There are no rales or rhonchi.  Expansion of the chest is symmetrical.  The precordium is quiet.  The first heart sound is normal.  The second heart sound is physiologically split.  There is no murmur gallop rub or click.  There is no abnormal lift or heave.  The abdomen is soft and nontender.  The bowel sounds are normal.  The liver and spleen are not enlarged.  There are no abdominal masses.  There are no abdominal bruits.  Extremities reveal good pedal pulses.  There is no phlebitis or edema.  There is no cyanosis or clubbing.  Strength is normal and symmetrical in all extremities.  There is no lateralizing weakness.  There are no sensory deficits.  The skin is warm and dry.  There is no rash.  EKG shows normal sinus rhythm and is within normal limits.    Assessment / Plan: Past history  of paroxysmal atrial fibrillation requiring cardioversion in 01/14/11.  No recurrence.  Plan: Continue taking alternate day baby aspirin.  Recheck in one year for followup office visit.  Continue full schedule of physical activity including tennis.

## 2014-09-11 DIAGNOSIS — R972 Elevated prostate specific antigen [PSA]: Secondary | ICD-10-CM | POA: Diagnosis not present

## 2014-09-18 DIAGNOSIS — R972 Elevated prostate specific antigen [PSA]: Secondary | ICD-10-CM | POA: Diagnosis not present

## 2014-09-25 DIAGNOSIS — L905 Scar conditions and fibrosis of skin: Secondary | ICD-10-CM | POA: Diagnosis not present

## 2014-09-25 DIAGNOSIS — L739 Follicular disorder, unspecified: Secondary | ICD-10-CM | POA: Diagnosis not present

## 2014-09-25 DIAGNOSIS — L820 Inflamed seborrheic keratosis: Secondary | ICD-10-CM | POA: Diagnosis not present

## 2014-09-25 DIAGNOSIS — D239 Other benign neoplasm of skin, unspecified: Secondary | ICD-10-CM | POA: Diagnosis not present

## 2014-09-25 DIAGNOSIS — D485 Neoplasm of uncertain behavior of skin: Secondary | ICD-10-CM | POA: Diagnosis not present

## 2014-10-31 DIAGNOSIS — C61 Malignant neoplasm of prostate: Secondary | ICD-10-CM | POA: Diagnosis not present

## 2014-10-31 DIAGNOSIS — R972 Elevated prostate specific antigen [PSA]: Secondary | ICD-10-CM | POA: Diagnosis not present

## 2014-11-21 DIAGNOSIS — C61 Malignant neoplasm of prostate: Secondary | ICD-10-CM | POA: Diagnosis not present

## 2014-12-03 DIAGNOSIS — C61 Malignant neoplasm of prostate: Secondary | ICD-10-CM | POA: Diagnosis not present

## 2014-12-10 DIAGNOSIS — C61 Malignant neoplasm of prostate: Secondary | ICD-10-CM | POA: Diagnosis not present

## 2014-12-10 DIAGNOSIS — R350 Frequency of micturition: Secondary | ICD-10-CM | POA: Diagnosis not present

## 2014-12-11 ENCOUNTER — Other Ambulatory Visit: Payer: Self-pay | Admitting: Urology

## 2014-12-11 DIAGNOSIS — M62838 Other muscle spasm: Secondary | ICD-10-CM | POA: Diagnosis not present

## 2014-12-11 DIAGNOSIS — N401 Enlarged prostate with lower urinary tract symptoms: Secondary | ICD-10-CM | POA: Diagnosis not present

## 2014-12-11 DIAGNOSIS — M6281 Muscle weakness (generalized): Secondary | ICD-10-CM | POA: Diagnosis not present

## 2014-12-11 DIAGNOSIS — R278 Other lack of coordination: Secondary | ICD-10-CM | POA: Diagnosis not present

## 2015-01-09 DIAGNOSIS — M62838 Other muscle spasm: Secondary | ICD-10-CM | POA: Diagnosis not present

## 2015-01-09 DIAGNOSIS — C61 Malignant neoplasm of prostate: Secondary | ICD-10-CM | POA: Diagnosis not present

## 2015-01-09 DIAGNOSIS — R278 Other lack of coordination: Secondary | ICD-10-CM | POA: Diagnosis not present

## 2015-01-09 DIAGNOSIS — M6281 Muscle weakness (generalized): Secondary | ICD-10-CM | POA: Diagnosis not present

## 2015-01-09 NOTE — Patient Instructions (Addendum)
YOUR PROCEDURE IS SCHEDULED ON :  01/17/15  REPORT TO [facility] MAIN ENTRANCE FOLLOW SIGNS TO SHORT STAY CENTER AT :  5:15 AM  CALL THIS NUMBER IF YOU HAVE PROBLEMS THE MORNING OF SURGERY [phone]  REMEMBER:  DO NOT EAT FOOD OR DRINK LIQUIDS AFTER MIDNIGHT  TAKE THESE MEDICINES THE MORNING OF SURGERY: NONE  YOU MAY NOT HAVE ANY METAL ON YOUR BODY INCLUDING HAIR PINS AND PIERCING'S. DO NOT WEAR JEWELRY, MAKEUP, LOTIONS, POWDERS OR PERFUMES. DO NOT WEAR NAIL POLISH. DO NOT SHAVE 48 HRS PRIOR TO SURGERY. MEN MAY SHAVE FACE AND NECK.  DO NOT [place]. [facility] IS NOT RESPONSIBLE FOR VALUABLES.  CONTACTS, DENTURES OR PARTIALS MAY NOT BE WORN TO SURGERY. LEAVE SUITCASE IN CAR. CAN BE BROUGHT TO ROOM AFTER SURGERY.  PATIENTS DISCHARGED THE DAY OF SURGERY WILL NOT BE ALLOWED TO DRIVE HOME.  PLEASE READ OVER THE FOLLOWING INSTRUCTION SHEETS _________________________________________________________________________________                                          [facility] - PREPARING FOR SURGERY  Before surgery, you can play an important role.  Because skin is not sterile, your skin needs to be as free of germs as possible.  You can reduce the number of germs on your skin by washing with CHG (chlorahexidine gluconate) soap before surgery.  CHG is an antiseptic cleaner which kills germs and bonds with the skin to continue killing germs even after washing. Please DO NOT use if you have an allergy to CHG or antibacterial soaps.  If your skin becomes reddened/irritated stop using the CHG and inform your nurse when you arrive at Short Stay. Do not shave (including legs and underarms) for at least 48 hours prior to the first CHG shower.  You may shave your face. Please follow these instructions carefully:   1.  Shower with CHG Soap the night before surgery and the  morning of Surgery.   2.  If you choose to wash your hair, wash your hair first as  usual with your  normal  Shampoo.   3.  After you shampoo, rinse your hair and body thoroughly to remove the  shampoo.                                         4.  Use CHG as you would any other liquid soap.  You can apply chg directly  to the skin and wash . Gently wash with scrungie or clean wascloth    5.  Apply the CHG Soap to your body ONLY FROM THE NECK DOWN.   Do not use on open                           Wound or open sores. Avoid contact with eyes, ears mouth and genitals (private parts).                        Genitals (private parts) with your normal soap.              6.  Wash thoroughly, paying special attention to the area where your surgery  will be performed.  7.  Thoroughly rinse your body with warm water from the neck down.   8.  DO NOT shower/wash with your normal soap after using and rinsing off  the CHG Soap .                9.  Pat yourself dry with a clean towel.             10.  Wear clean night clothes to bed after shower             11.  Place clean sheets on your bed the night of your first shower and do not  sleep with pets.  Day of Surgery : Do not apply any lotions/deodorants the morning of surgery.  Please wear clean clothes to the hospital/surgery center.  FAILURE TO FOLLOW THESE INSTRUCTIONS MAY RESULT IN THE CANCELLATION OF YOUR SURGERY    PATIENT SIGNATURE_________________________________  ______________________________________________________________________

## 2015-01-10 ENCOUNTER — Encounter (HOSPITAL_COMMUNITY)
Admission: RE | Admit: 2015-01-10 | Discharge: 2015-01-10 | Disposition: A | Discharge status: Home / Self Care | Payer: Medicare Other | Source: Ambulatory Visit | Attending: Urology | Admitting: Urology

## 2015-01-10 ENCOUNTER — Encounter (HOSPITAL_COMMUNITY): Payer: Self-pay

## 2015-01-10 DIAGNOSIS — Z01812 Encounter for preprocedural laboratory examination: Secondary | ICD-10-CM | POA: Insufficient documentation

## 2015-01-10 DIAGNOSIS — C61 Malignant neoplasm of prostate: Secondary | ICD-10-CM | POA: Diagnosis not present

## 2015-01-10 HISTORY — DX: Other complications of anesthesia, initial encounter: T88.59XA

## 2015-01-10 HISTORY — DX: Other specified postprocedural states: R11.2

## 2015-01-10 HISTORY — DX: Malignant neoplasm of prostate: C61

## 2015-01-10 HISTORY — DX: Headache: R51

## 2015-01-10 HISTORY — DX: Adverse effect of unspecified anesthetic, initial encounter: T41.45XA

## 2015-01-10 HISTORY — DX: Personal history of other malignant neoplasm of skin: Z85.828

## 2015-01-10 HISTORY — DX: Unspecified osteoarthritis, unspecified site: M19.90

## 2015-01-10 HISTORY — DX: Headache, unspecified: R51.9

## 2015-01-10 HISTORY — DX: Other specified postprocedural states: Z98.890

## 2015-01-10 LAB — BASIC METABOLIC PANEL
Anion gap: 7 (ref 5–15)
BUN: 24 mg/dL — ABNORMAL HIGH (ref 6–20)
CHLORIDE: 104 mmol/L (ref 101–111)
CO2: 30 mmol/L (ref 22–32)
CREATININE: 0.91 mg/dL (ref 0.61–1.24)
Calcium: 9.5 mg/dL (ref 8.9–10.3)
GFR calc non Af Amer: >60 mL/min (ref >60)
GLUCOSE: 92 mg/dL (ref 65–99)
Potassium: 5.1 mmol/L (ref 3.5–5.1)
Sodium: 141 mmol/L (ref 135–145)

## 2015-01-10 LAB — CBC
HEMATOCRIT: 48.7 % (ref 39.0–52.0)
HEMOGLOBIN: 16.4 g/dL (ref 13.0–17.0)
MCH: 32.9 pg (ref 26.0–34.0)
MCHC: 33.7 g/dL (ref 30.0–36.0)
MCV: 97.6 fL (ref 78.0–100.0)
PLATELETS: 233 10*3/uL (ref 150–400)
RBC: 4.99 MIL/uL (ref 4.22–5.81)
RDW: 13 % (ref 11.5–15.5)
WBC: 6.4 10*3/uL (ref 4.0–10.5)

## 2015-01-10 LAB — ABO/RH: ABO/RH(D): A POS

## 2015-01-14 ENCOUNTER — Encounter: Payer: Self-pay | Admitting: *Deleted

## 2015-01-15 ENCOUNTER — Encounter: Payer: Self-pay | Admitting: Cardiology

## 2015-01-15 ENCOUNTER — Ambulatory Visit (INDEPENDENT_AMBULATORY_CARE_PROVIDER_SITE_OTHER): Payer: Medicare Other | Admitting: Cardiology

## 2015-01-15 VITALS — BP 120/62 | HR 87 | Ht 67.0 in | Wt 159.1 lb

## 2015-01-15 DIAGNOSIS — I480 Paroxysmal atrial fibrillation: Secondary | ICD-10-CM | POA: Diagnosis not present

## 2015-01-15 NOTE — Patient Instructions (Signed)
Medication Instructions:  Your physician recommends that you continue on your current medications as directed. Please refer to the Current Medication list given to you today.  Labwork: none  Testing/Procedures: none  Follow-Up: Your physician wants you to follow-up in: 1 year ov/ekg You will receive a reminder letter in the mail two months in advance. If you don't receive a letter, please call our office to schedule the follow-up appointment.     

## 2015-01-15 NOTE — Progress Notes (Signed)
Cardiology Office Note   Date:  01/15/2015   ID:  Joseph [name], DOB May 08, 1936, MRN [id number]  PCP:  Horatio Pel, MD  Cardiologist: Darlin Coco MD  No chief complaint on file.     History of Present Illness: Joseph [name] is a 79 y.o. male who presents for a one-year follow-up office visit.   He has a past history of paroxysmal atrial fibrillation which was discovered during a postoperative checkup in 2012. On 01/14/11 he underwent successful direct-current cardioversion. The patient has had no recurrent atrial fibrillation. He is physically active and plays tennis 2 or 3 times a week. His only medication is aspirin 81 mg every other day. Over the past several years he has had a rapid increase in his PSA level.  He has been found to have prostate cancer.  He is scheduled for a radical prostatectomy using robotic surgery on June 10. From the cardiac standpoint the patient has had no chest pain or shortness of breath.  He has not been aware of any recurrent palpitations or arrhythmia.  Past Medical History  Diagnosis Date  . Atrial fibrillation     resolved with cardioversion  . Sigmoid diverticulitis April of 2012    with small perforation  . Complication of anesthesia   . PONV (postoperative nausea and vomiting)   . Arthritis   . History of skin cancer   . Squamous cell carcinoma   . Prostate cancer   . Headache     occasional    Past Surgical History  Procedure Laterality Date  . Shoulder surgery      x 2  . Nasal septum surgery    . Lasik    . Cardioversion  01/2009     Current Outpatient Prescriptions  Medication Sig Dispense Refill  . acetaminophen (TYLENOL) 500 MG tablet Take 500 mg by mouth every 4 (four) hours as needed for moderate pain or headache.     Marland Kitchen aspirin 81 MG tablet Take 81 mg by mouth every other day.     . naphazoline (NAPHCON) 0.1 % ophthalmic solution Place 1 drop into both eyes daily as needed for irritation or allergies.    .  naproxen sodium (ANAPROX) 220 MG tablet Take 220-400 mg by mouth daily as needed (pain.).     Marland Kitchen Polyethyl Glycol-Propyl Glycol (SYSTANE PRESERVATIVE FREE) 0.4-0.3 % SOLN Apply 1-2 drops to eye as needed (dry eyes).     No current facility-administered medications for this visit.    Allergies:   Review of patient's allergies indicates no known allergies.    Social History:  The patient  reports that he has never smoked. He has never used smokeless tobacco. He reports that he drinks alcohol. He reports that he does not use illicit drugs.   Family History:  The patient's family history includes Other in his father and mother.    ROS:  Please see the history of present illness.   Otherwise, review of systems are positive for none.   All other systems are reviewed and negative.    PHYSICAL EXAM: VS:  BP 120/62 mmHg  Pulse 87  Ht 5\' 7"  (1.702 m)  Wt 159 lb 1.9 oz (72.176 kg)  BMI 24.92 kg/m2 , BMI Body mass index is 24.92 kg/(m^2). GEN: Well nourished, well developed, in no acute distress HEENT: normal Neck: no JVD, carotid bruits, or masses Cardiac: RRR; no murmurs, rubs, or gallops,no edema  Respiratory:  clear to auscultation bilaterally, normal work of breathing  GI: soft, nontender, nondistended, + BS MS: no deformity or atrophy Skin: warm and dry, no rash Neuro:  Strength and sensation are intact Psych: euthymic mood, full affect   EKG:  EKG is ordered today. The ekg ordered today demonstrates normal sinus rhythm.  Within normal limits.  We gave him a copy to carry to his surgery for this week.   Recent Labs: 01/10/2015: BUN 24*; Creatinine, Ser 0.91; Hemoglobin 16.4; Platelets 233; Potassium 5.1; Sodium 141    Lipid Panel No results found for: CHOL, TRIG, HDL, CHOLHDL, VLDL, LDLCALC, LDLDIRECT    Wt Readings from Last 3 Encounters:  01/15/15 159 lb 1.9 oz (72.176 kg)  01/10/15 160 lb (72.576 kg)  01/22/14 155 lb (70.308 kg)        ASSESSMENT AND PLAN:  1.   Remote history of paroxysmal atrial fibrillation following hernia surgery.  This was in 2012.  No recurrence. 2.  Good general health 3.  Prostate cancer.  Radical prostatectomy scheduled for June 10.   Current medicines are reviewed at length with the patient today.  The patient does not have concerns regarding medicines.  The following changes have been made:  no change  Labs/ tests ordered today include:   Orders Placed This Encounter  Procedures  . EKG 12-Lead     Disposition: From the cardiac standpoint the patient is cleared for surgery. No new medications prescribed.  Recheck in one year for office visit and EKG  Signed, Darlin Coco MD 01/15/2015 3:44 PM    [address], [place], [place]  [address] Phone: [phone]; Fax: [phone]

## 2015-01-17 ENCOUNTER — Encounter (HOSPITAL_COMMUNITY)
Admission: RE | Disposition: A | Discharge status: Home / Self Care | Payer: Self-pay | Source: Ambulatory Visit | Attending: Urology

## 2015-01-17 ENCOUNTER — Encounter (HOSPITAL_COMMUNITY): Payer: Self-pay | Admitting: *Deleted

## 2015-01-17 ENCOUNTER — Inpatient Hospital Stay (HOSPITAL_COMMUNITY): Payer: Medicare Other | Admitting: Anesthesiology

## 2015-01-17 ENCOUNTER — Inpatient Hospital Stay (HOSPITAL_COMMUNITY)
Admission: RE | Admit: 2015-01-17 | Discharge: 2015-01-18 | DRG: 708 | Disposition: A | Discharge status: Home / Self Care | Payer: Medicare Other | Source: Ambulatory Visit | Attending: Urology | Admitting: Urology

## 2015-01-17 DIAGNOSIS — Z01812 Encounter for preprocedural laboratory examination: Secondary | ICD-10-CM | POA: Diagnosis not present

## 2015-01-17 DIAGNOSIS — Z7982 Long term (current) use of aspirin: Secondary | ICD-10-CM

## 2015-01-17 DIAGNOSIS — C61 Malignant neoplasm of prostate: Secondary | ICD-10-CM | POA: Diagnosis not present

## 2015-01-17 DIAGNOSIS — Z85828 Personal history of other malignant neoplasm of skin: Secondary | ICD-10-CM

## 2015-01-17 HISTORY — PX: LYMPHADENECTOMY: SHX5960

## 2015-01-17 HISTORY — PX: ROBOT ASSISTED LAPAROSCOPIC RADICAL PROSTATECTOMY: SHX5141

## 2015-01-17 LAB — HEMOGLOBIN AND HEMATOCRIT, BLOOD
HEMATOCRIT: 44.3 % (ref 39.0–52.0)
Hemoglobin: 14.7 g/dL (ref 13.0–17.0)

## 2015-01-17 LAB — TYPE AND SCREEN
ABO/RH(D): A POS
Antibody Screen: NEGATIVE

## 2015-01-17 SURGERY — ROBOTIC ASSISTED LAPAROSCOPIC RADICAL PROSTATECTOMY
Anesthesia: General

## 2015-01-17 MED ORDER — CEFAZOLIN SODIUM-DEXTROSE 2-3 GM-% IV SOLR
INTRAVENOUS | Status: DC | PRN
  Administered 2015-01-17: 2 g via INTRAVENOUS

## 2015-01-17 MED ORDER — GLYCOPYRROLATE 0.2 MG/ML IJ SOLN
INTRAMUSCULAR | Status: DC | PRN
  Administered 2015-01-17: .6 mg via INTRAVENOUS

## 2015-01-17 MED ORDER — MIDAZOLAM HCL 2 MG/2ML IJ SOLN
INTRAMUSCULAR | Status: AC
  Filled 2015-01-17: qty 2

## 2015-01-17 MED ORDER — GLYCOPYRROLATE 0.2 MG/ML IJ SOLN
INTRAMUSCULAR | Status: AC
  Filled 2015-01-17: qty 2

## 2015-01-17 MED ORDER — ROCURONIUM BROMIDE 100 MG/10ML IV SOLN
INTRAVENOUS | Status: AC
  Filled 2015-01-17: qty 2

## 2015-01-17 MED ORDER — CEFAZOLIN SODIUM-DEXTROSE 2-3 GM-% IV SOLR
INTRAVENOUS | Status: AC
  Filled 2015-01-17: qty 50

## 2015-01-17 MED ORDER — FENTANYL CITRATE (PF) 100 MCG/2ML IJ SOLN
INTRAMUSCULAR | Status: DC | PRN
  Administered 2015-01-17: 100 ug via INTRAVENOUS
  Administered 2015-01-17 (×3): 50 ug via INTRAVENOUS

## 2015-01-17 MED ORDER — ONDANSETRON HCL 4 MG/2ML IJ SOLN
4.0000 mg | INTRAMUSCULAR | Status: DC | PRN
  Administered 2015-01-17: 4 mg via INTRAVENOUS
  Filled 2015-01-17: qty 2

## 2015-01-17 MED ORDER — PROMETHAZINE HCL 25 MG/ML IJ SOLN: 6.2500 mg | INTRAMUSCULAR | Status: DC | PRN

## 2015-01-17 MED ORDER — HYDROCODONE-ACETAMINOPHEN 5-325 MG PO TABS: 1.0000 | ORAL_TABLET | Freq: Four times a day (QID) | ORAL | Status: DC | PRN

## 2015-01-17 MED ORDER — ROCURONIUM BROMIDE 100 MG/10ML IV SOLN
INTRAVENOUS | Status: AC
  Filled 2015-01-17: qty 1

## 2015-01-17 MED ORDER — PROPOFOL 10 MG/ML IV BOLUS
INTRAVENOUS | Status: DC | PRN
  Administered 2015-01-17: 150 mg via INTRAVENOUS

## 2015-01-17 MED ORDER — ACETAMINOPHEN 500 MG PO TABS
1000.0000 mg | ORAL_TABLET | Freq: Four times a day (QID) | ORAL | Status: AC
  Administered 2015-01-17 – 2015-01-18 (×4): 1000 mg via ORAL
  Filled 2015-01-17 (×4): qty 2

## 2015-01-17 MED ORDER — NEOSTIGMINE METHYLSULFATE 10 MG/10ML IV SOLN
INTRAVENOUS | Status: DC | PRN
  Administered 2015-01-17: 4 mg via INTRAVENOUS

## 2015-01-17 MED ORDER — PROPOFOL 10 MG/ML IV BOLUS
INTRAVENOUS | Status: AC
  Filled 2015-01-17: qty 20

## 2015-01-17 MED ORDER — HYDROMORPHONE HCL 1 MG/ML IJ SOLN: 0.5000 mg | INTRAMUSCULAR | Status: DC | PRN

## 2015-01-17 MED ORDER — SULFAMETHOXAZOLE-TRIMETHOPRIM 800-160 MG PO TABS: 1.0000 | ORAL_TABLET | Freq: Two times a day (BID) | ORAL | Status: DC

## 2015-01-17 MED ORDER — HYDROMORPHONE HCL 1 MG/ML IJ SOLN
INTRAMUSCULAR | Status: DC | PRN
  Administered 2015-01-17 (×2): 0.5 mg via INTRAVENOUS
  Administered 2015-01-17: .2 mg via INTRAVENOUS
  Administered 2015-01-17 (×2): .4 mg via INTRAVENOUS

## 2015-01-17 MED ORDER — NEOSTIGMINE METHYLSULFATE 10 MG/10ML IV SOLN
INTRAVENOUS | Status: AC
  Filled 2015-01-17: qty 1

## 2015-01-17 MED ORDER — ONDANSETRON HCL 4 MG/2ML IJ SOLN
INTRAMUSCULAR | Status: DC | PRN
  Administered 2015-01-17 (×2): 4 mg via INTRAVENOUS

## 2015-01-17 MED ORDER — ARTIFICIAL TEARS OP OINT
TOPICAL_OINTMENT | OPHTHALMIC | Status: AC
  Filled 2015-01-17: qty 3.5

## 2015-01-17 MED ORDER — LACTATED RINGERS IR SOLN
Status: DC | PRN
  Administered 2015-01-17: 1000 mL

## 2015-01-17 MED ORDER — BUPIVACAINE LIPOSOME 1.3 % IJ SUSP
INTRAMUSCULAR | Status: DC | PRN
  Administered 2015-01-17: 20 mL

## 2015-01-17 MED ORDER — SODIUM CHLORIDE 0.9 % IV BOLUS (SEPSIS): 1000.0000 mL | Freq: Once | INTRAVENOUS | Status: DC

## 2015-01-17 MED ORDER — FENTANYL CITRATE (PF) 250 MCG/5ML IJ SOLN
INTRAMUSCULAR | Status: AC
  Filled 2015-01-17: qty 5

## 2015-01-17 MED ORDER — ARTIFICIAL TEARS OP OINT
TOPICAL_OINTMENT | OPHTHALMIC | Status: DC | PRN
  Administered 2015-01-17: 1 via OPHTHALMIC

## 2015-01-17 MED ORDER — LIDOCAINE HCL (CARDIAC) 20 MG/ML IV SOLN
INTRAVENOUS | Status: AC
  Filled 2015-01-17: qty 10

## 2015-01-17 MED ORDER — STERILE WATER FOR IRRIGATION IR SOLN
Status: DC | PRN
  Administered 2015-01-17: 3000 mL

## 2015-01-17 MED ORDER — GLYCOPYRROLATE 0.2 MG/ML IJ SOLN
INTRAMUSCULAR | Status: AC
  Filled 2015-01-17: qty 1

## 2015-01-17 MED ORDER — LACTATED RINGERS IV SOLN
INTRAVENOUS | Status: DC | PRN
  Administered 2015-01-17 (×2): via INTRAVENOUS

## 2015-01-17 MED ORDER — PHENYLEPHRINE HCL 10 MG/ML IJ SOLN
INTRAMUSCULAR | Status: DC | PRN
  Administered 2015-01-17 (×3): 80 ug via INTRAVENOUS

## 2015-01-17 MED ORDER — OXYCODONE HCL 5 MG/5ML PO SOLN
5.0000 mg | Freq: Once | ORAL | Status: DC | PRN
  Filled 2015-01-17: qty 5

## 2015-01-17 MED ORDER — DEXAMETHASONE SODIUM PHOSPHATE 10 MG/ML IJ SOLN
INTRAMUSCULAR | Status: DC | PRN
  Administered 2015-01-17: 10 mg via INTRAVENOUS

## 2015-01-17 MED ORDER — ONDANSETRON HCL 4 MG/2ML IJ SOLN
INTRAMUSCULAR | Status: AC
  Filled 2015-01-17: qty 2

## 2015-01-17 MED ORDER — OXYCODONE HCL 5 MG PO TABS
5.0000 mg | ORAL_TABLET | ORAL | Status: DC | PRN
  Administered 2015-01-17 – 2015-01-18 (×3): 5 mg via ORAL
  Filled 2015-01-17 (×3): qty 1

## 2015-01-17 MED ORDER — HYDROMORPHONE HCL 2 MG/ML IJ SOLN
INTRAMUSCULAR | Status: AC
  Filled 2015-01-17: qty 1

## 2015-01-17 MED ORDER — LIDOCAINE HCL (CARDIAC) 20 MG/ML IV SOLN
INTRAVENOUS | Status: DC | PRN
  Administered 2015-01-17: 50 mg via INTRAVENOUS

## 2015-01-17 MED ORDER — MIDAZOLAM HCL 5 MG/5ML IJ SOLN
INTRAMUSCULAR | Status: DC | PRN
  Administered 2015-01-17: 1 mg via INTRAVENOUS

## 2015-01-17 MED ORDER — SODIUM CHLORIDE 0.9 % IJ SOLN
INTRAMUSCULAR | Status: AC
  Filled 2015-01-17: qty 20

## 2015-01-17 MED ORDER — OXYCODONE HCL 5 MG PO TABS: 5.0000 mg | ORAL_TABLET | Freq: Once | ORAL | Status: DC | PRN

## 2015-01-17 MED ORDER — HYDROMORPHONE HCL 1 MG/ML IJ SOLN: 0.2500 mg | INTRAMUSCULAR | Status: DC | PRN

## 2015-01-17 MED ORDER — DEXTROSE-NACL 5-0.45 % IV SOLN
INTRAVENOUS | Status: DC
  Administered 2015-01-17 – 2015-01-18 (×2): via INTRAVENOUS

## 2015-01-17 MED ORDER — ONDANSETRON HCL 4 MG/2ML IJ SOLN
INTRAMUSCULAR | Status: AC
  Filled 2015-01-17: qty 4

## 2015-01-17 MED ORDER — ROCURONIUM BROMIDE 100 MG/10ML IV SOLN
INTRAVENOUS | Status: DC | PRN
  Administered 2015-01-17 (×2): 10 mg via INTRAVENOUS
  Administered 2015-01-17: 5 mg via INTRAVENOUS
  Administered 2015-01-17 (×2): 10 mg via INTRAVENOUS
  Administered 2015-01-17: 40 mg via INTRAVENOUS

## 2015-01-17 MED ORDER — DEXAMETHASONE SODIUM PHOSPHATE 10 MG/ML IJ SOLN
INTRAMUSCULAR | Status: AC
  Filled 2015-01-17: qty 1

## 2015-01-17 MED ORDER — BUPIVACAINE LIPOSOME 1.3 % IJ SUSP
20.0000 mL | Freq: Once | INTRAMUSCULAR | Status: DC
  Filled 2015-01-17: qty 20

## 2015-01-17 SURGICAL SUPPLY — 53 items
CABLE HIGH FREQUENCY MONO STRZ (ELECTRODE) ×3 IMPLANT
CATH FOLEY 2WAY SLVR 18FR 30CC (CATHETERS) ×3 IMPLANT
CATH TIEMANN FOLEY 18FR 5CC (CATHETERS) ×3 IMPLANT
CHLORAPREP W/TINT 26ML (MISCELLANEOUS) ×3 IMPLANT
CLIP LIGATING HEM O LOK PURPLE (MISCELLANEOUS) ×6 IMPLANT
CLIP LIGATING HEMO LOK XL GOLD (MISCELLANEOUS) ×6 IMPLANT
CLOTH BEACON ORANGE TIMEOUT ST (SAFETY) ×3 IMPLANT
CONT SPEC 4OZ CLIKSEAL STRL BL (MISCELLANEOUS) ×3 IMPLANT
COVER SURGICAL LIGHT HANDLE (MISCELLANEOUS) ×3 IMPLANT
COVER TIP SHEARS 8 DVNC (MISCELLANEOUS) ×2 IMPLANT
COVER TIP SHEARS 8MM DA VINCI (MISCELLANEOUS) ×1
CUTTER ECHEON FLEX ENDO 45 340 (ENDOMECHANICALS) ×3 IMPLANT
DECANTER SPIKE VIAL GLASS SM (MISCELLANEOUS) IMPLANT
DRSG TEGADERM 4X4.75 (GAUZE/BANDAGES/DRESSINGS) ×3 IMPLANT
DRSG TEGADERM 6X8 (GAUZE/BANDAGES/DRESSINGS) IMPLANT
ELECT REM PT RETURN 9FT ADLT (ELECTROSURGICAL) ×3
ELECTRODE REM PT RTRN 9FT ADLT (ELECTROSURGICAL) ×2 IMPLANT
GAUZE SPONGE 2X2 8PLY STRL LF (GAUZE/BANDAGES/DRESSINGS) IMPLANT
GLOVE BIO SURGEON STRL SZ 6.5 (GLOVE) ×3 IMPLANT
GLOVE BIOGEL M STRL SZ7.5 (GLOVE) ×6 IMPLANT
GLOVE BIOGEL PI IND STRL 7.5 (GLOVE) ×2 IMPLANT
GLOVE BIOGEL PI INDICATOR 7.5 (GLOVE) ×1
GOWN STRL REUS W/TWL LRG LVL3 (GOWN DISPOSABLE) ×6 IMPLANT
GOWN STRL REUS W/TWL LRG LVL4 (GOWN DISPOSABLE) ×9 IMPLANT
HOLDER FOLEY CATH W/STRAP (MISCELLANEOUS) ×3 IMPLANT
IV LACTATED RINGERS 1000ML (IV SOLUTION) IMPLANT
KIT ACCESSORY DA VINCI DISP (KITS) ×1
KIT ACCESSORY DVNC DISP (KITS) ×2 IMPLANT
KIT PROCEDURE DA VINCI SI (MISCELLANEOUS) ×1
KIT PROCEDURE DVNC SI (MISCELLANEOUS) ×2 IMPLANT
LIQUID BAND (GAUZE/BANDAGES/DRESSINGS) ×3 IMPLANT
NEEDLE INSUFFLATION 14GA 120MM (NEEDLE) ×3 IMPLANT
PACK ROBOT UROLOGY CUSTOM (CUSTOM PROCEDURE TRAY) ×3 IMPLANT
PAD POSITIONING PINK XL (MISCELLANEOUS) IMPLANT
RELOAD GREEN ECHELON 45 (STAPLE) ×6 IMPLANT
SET TUBE IRRIG SUCTION NO TIP (IRRIGATION / IRRIGATOR) ×3 IMPLANT
SHEET LAVH (DRAPES) IMPLANT
SOLUTION ELECTROLUBE (MISCELLANEOUS) ×3 IMPLANT
SPONGE GAUZE 2X2 STER 10/PKG (GAUZE/BANDAGES/DRESSINGS)
SPONGE LAP 4X18 X RAY DECT (DISPOSABLE) ×3 IMPLANT
SUT ETHILON 3 0 PS 1 (SUTURE) ×3 IMPLANT
SUT MNCRL AB 4-0 PS2 18 (SUTURE) ×6 IMPLANT
SUT PDS AB 1 CT1 27 (SUTURE) ×6 IMPLANT
SUT VIC AB 2-0 SH 27 (SUTURE) ×1
SUT VIC AB 2-0 SH 27X BRD (SUTURE) ×2 IMPLANT
SUT VICRYL 0 UR6 27IN ABS (SUTURE) ×3 IMPLANT
SUT VLOC BARB 180 ABS3/0GR12 (SUTURE) ×9
SUTURE VLOC BRB 180 ABS3/0GR12 (SUTURE) ×6 IMPLANT
SYR 27GX1/2 1ML LL SAFETY (SYRINGE) ×3 IMPLANT
TOWEL OR 17X26 10 PK STRL BLUE (TOWEL DISPOSABLE) ×3 IMPLANT
TOWEL OR NON WOVEN STRL DISP B (DISPOSABLE) ×3 IMPLANT
TROCAR 12M 150ML BLUNT (TROCAR) ×3 IMPLANT
WATER STERILE IRR 1500ML POUR (IV SOLUTION) IMPLANT

## 2015-01-17 NOTE — Progress Notes (Signed)
Pt had one episode of emesis after standing for the first time.  Zofran was already given the hour prior.  After nausea passed pt stated he felt much better and was able to tolerate his clear liquid tray.  Will continue to monitor closely.

## 2015-01-17 NOTE — Transfer of Care (Signed)
Immediate Anesthesia Transfer of Care Note  Patient: Joseph [name]  Procedure(s) Performed: Procedure(s): ROBOTIC ASSISTED LAPAROSCOPIC RADICAL PROSTATECTOMY WITH INDOCYANINE GREEN DYE INJECTION (N/A) PELVIC LYMPHADENECTOMY (Bilateral)  Patient Location: PACU  Anesthesia Type:General  Level of Consciousness:  sedated, patient cooperative and responds to stimulation  Airway & Oxygen Therapy:Patient Spontanous Breathing and Patient connected to face mask oxgen  Post-op Assessment:  Report given to PACU RN and Post -op Vital signs reviewed and stable  Post vital signs:  Reviewed and stable  Last Vitals:  Filed Vitals:   01/17/15 0526  BP: 135/80  Pulse: 83  Temp: 36.6 C  Resp: 18    Complications: No apparent anesthesia complications

## 2015-01-17 NOTE — Anesthesia Procedure Notes (Signed)
Procedure Name: Intubation Date/Time: 01/17/2015 7:52 AM Performed by: Dhanush Jokerst, Virgel Gess Pre-anesthesia Checklist: Patient identified, Emergency Drugs available, Suction available, Patient being monitored and Timeout performed Patient Re-evaluated:Patient Re-evaluated prior to inductionOxygen Delivery Method: Circle system utilized Preoxygenation: Pre-oxygenation with 100% oxygen Intubation Type: IV induction Ventilation: Mask ventilation without difficulty Laryngoscope Size: Mac and 4 Grade View: Grade I Tube type: Oral Tube size: 7.5 mm Number of attempts: 1 Airway Equipment and Method: Stylet Placement Confirmation: ETT inserted through vocal cords under direct vision,  positive ETCO2,  CO2 detector and breath sounds checked- equal and bilateral Secured at: 22 cm Tube secured with: Tape Dental Injury: Teeth and Oropharynx as per pre-operative assessment

## 2015-01-17 NOTE — Anesthesia Postprocedure Evaluation (Signed)
  Anesthesia Post-op Note  Patient: Joseph [name]  Procedure(s) Performed: Procedure(s): ROBOTIC ASSISTED LAPAROSCOPIC RADICAL PROSTATECTOMY WITH INDOCYANINE GREEN DYE INJECTION (N/A) PELVIC LYMPHADENECTOMY (Bilateral)  Patient Location: PACU  Anesthesia Type:General  Level of Consciousness: awake, alert  and oriented  Airway and Oxygen Therapy: Patient Spontanous Breathing  Post-op Pain: mild  Post-op Assessment: Post-op Vital signs reviewed              Post-op Vital Signs: Reviewed  Last Vitals:  Filed Vitals:   01/17/15 1538  BP: 120/67  Pulse: 87  Temp: 36.7 C  Resp: 18    Complications: No apparent anesthesia complications

## 2015-01-17 NOTE — Anesthesia Preprocedure Evaluation (Addendum)
Anesthesia Evaluation  Patient identified by MRN, date of birth, ID band Patient awake    Reviewed: Allergy & Precautions, NPO status , Patient's Chart, lab work & pertinent test results  Airway Mallampati: II  TM Distance: >3 FB Neck ROM: Full    Dental   Pulmonary neg pulmonary ROS,  breath sounds clear to auscultation        Cardiovascular negative cardio ROS  Rhythm:Regular Rate:Normal     Neuro/Psych  Headaches,    GI/Hepatic negative GI ROS, Neg liver ROS,   Endo/Other  negative endocrine ROS  Renal/GU negative Renal ROS     Musculoskeletal  (+) Arthritis -,   Abdominal   Peds  Hematology negative hematology ROS (+)   Anesthesia Other Findings   Reproductive/Obstetrics                            Lab Results  Component Value Date   WBC 6.4 01/10/2015   HGB 16.4 01/10/2015   HCT 48.7 01/10/2015   MCV 97.6 01/10/2015   PLT 233 01/10/2015   Lab Results  Component Value Date   CREATININE 0.91 01/10/2015   BUN 24* 01/10/2015   NA 141 01/10/2015   K 5.1 01/10/2015   CL 104 01/10/2015   CO2 30 01/10/2015    Anesthesia Physical Anesthesia Plan  ASA: II  Anesthesia Plan: General   Post-op Pain Management:    Induction: Intravenous  Airway Management Planned: Oral ETT  Additional Equipment:   Intra-op Plan:   Post-operative Plan: Extubation in OR  Informed Consent: I have reviewed the patients History and Physical, chart, labs and discussed the procedure including the risks, benefits and alternatives for the proposed anesthesia with the patient or authorized representative who has indicated his/her understanding and acceptance.   Dental advisory given  Plan Discussed with: CRNA  Anesthesia Plan Comments:         Anesthesia Quick Evaluation

## 2015-01-17 NOTE — Brief Op Note (Signed)
01/17/2015  11:19 AM  PATIENT:  Joseph [name]  79 y.o. male  PRE-OPERATIVE DIAGNOSIS:  PROSTATE CANCER  POST-OPERATIVE DIAGNOSIS:  PROSTATE CANCER  PROCEDURE:  Procedure(s): ROBOTIC ASSISTED LAPAROSCOPIC RADICAL PROSTATECTOMY WITH INDOCYANINE GREEN DYE INJECTION (N/A) PELVIC LYMPHADENECTOMY (Bilateral)  SURGEON:  Surgeon(s) and Role:    * Alexis Frock, MD - Primary  PHYSICIAN ASSISTANT:   ASSISTANTS: 1 - Clemetine Marker PA; 2 - Gypsy Lore MD   ANESTHESIA:   local and general  EBL:  Total I/O In: 1200 [I.V.:1200] Out: 125 [Blood:125]  BLOOD ADMINISTERED:none  DRAINS: 1 - JP to bulb; 2 - Foley to gravity   LOCAL MEDICATIONS USED:  MARCAINE     SPECIMEN:  Source of Specimen:  1 - prostatectomy; 2- pelvic lymph nodes; 3 - periprostatic fat  DISPOSITION OF SPECIMEN:  PATHOLOGY  COUNTS:  YES  TOURNIQUET:  * No tourniquets in log *  DICTATION: .Other Dictation: Dictation Number [id number]  PLAN OF CARE: Admit to inpatient   PATIENT DISPOSITION:  PACU - hemodynamically stable.   Delay start of Pharmacological VTE agent (>24hrs) due to surgical blood loss or risk of bleeding: yes

## 2015-01-17 NOTE — H&P (Signed)
Joseph [name] is an 79 y.o. male.    Chief Complaint: Pre-OP Robotic Prostatectomy with Pelvic Lymphadenectomy  HPI:     1 - Moderate Risk Prostate Cancer -  Gleaston 4+3=7 RLM, Gleason 6 in [facility], LMA< RMA< RMM by biopsy 10/2014 on eval increased PSA velocity. Pre BX PSA 4.09. TRUS 61mL w/o median lobe.  Staging CT pelvis: benign.  PMH sig for shoulder surgery, AFib/cardioversion (follows [facility], no issues for years). NO ischemic heart disease. No blood thinners. He plays tennis 3 days per week. His PCP is Joseph Pretty MD.   Today "Joseph [name]" is seen to proceed with robotic prostatectomy.   Past Medical History  Diagnosis Date  . Atrial fibrillation     resolved with cardioversion  . Sigmoid diverticulitis April of 2012    with small perforation  . Complication of anesthesia   . PONV (postoperative nausea and vomiting)   . Arthritis   . History of skin cancer   . Squamous cell carcinoma   . Prostate cancer   . Headache     occasional    Past Surgical History  Procedure Laterality Date  . Shoulder surgery      x 2  . Nasal septum surgery    . Lasik    . Cardioversion  01/2009    Family History  Problem Relation Age of Onset  . Other Mother     no neg hx  . Other Father     no neg hx   Social History:  reports that he has never smoked. He has never used smokeless tobacco. He reports that he drinks alcohol. He reports that he does not use illicit drugs.  Allergies: No Known Allergies  Medications Prior to Admission  Medication Sig Dispense Refill  . acetaminophen (TYLENOL) 500 MG tablet Take 500 mg by mouth every 4 (four) hours as needed for moderate pain or headache.     Marland Kitchen aspirin 81 MG tablet Take 81 mg by mouth every other day.     . naphazoline (NAPHCON) 0.1 % ophthalmic solution Place 1 drop into both eyes daily as needed for irritation or allergies.    . naproxen sodium (ANAPROX) 220 MG tablet Take 220-400 mg by mouth daily as needed (pain.).     Marland Kitchen Polyethyl  Glycol-Propyl Glycol (SYSTANE PRESERVATIVE FREE) 0.4-0.3 % SOLN Apply 1-2 drops to eye as needed (dry eyes).      No results found for this or any previous visit (from the past 48 hour(s)). No results found.  Review of Systems  Constitutional: Negative.  Negative for fever and chills.  HENT: Negative.   Eyes: Negative.   Respiratory: Negative.   Cardiovascular: Negative.   Gastrointestinal: Negative.   Genitourinary: Negative.   Musculoskeletal: Negative.   Skin: Negative.   Neurological: Negative.   Endo/Heme/Allergies: Negative.   Psychiatric/Behavioral: Negative.     Blood pressure 135/80, pulse 83, temperature 97.9 F (36.6 C), temperature source Oral, resp. rate 18, height 5\' 7"  (1.702 m), weight 72.122 kg (159 lb), SpO2 97 %. Physical Exam  Constitutional: He appears well-developed.  HENT:  Head: Normocephalic.  Eyes: Pupils are equal, round, and reactive to light.  Neck: Normal range of motion.  Cardiovascular: Normal rate.   Respiratory: Effort normal.  GI: Soft.  Genitourinary:  No CVAT  Musculoskeletal: Normal range of motion.  Neurological: He is alert.  Skin: Skin is warm.  Psychiatric: He has a normal mood and affect. His behavior is normal. Judgment and thought content normal.  Assessment/Plan    1 - Moderate Risk Prostate Cancer -    We rediscussed prostatectomy and specifically robotic prostatectomy with bilateral pelvic lymphadenectomy being the technique that I most commonly perform. I showed the patient on their abdomen the approximately 6 small incision (trocar) sites as well as presumed extraction sites with robotic approach as well as possible open incision sites should open conversion be necessary. We rediscussed peri-operative risks including bleeding, infection, deep vein thrombosis, pulmonary embolism, compartment syndrome, nuropathy / neuropraxia, heart attack, stroke, death, as well as long-term risks such as non-cure / need for additional  therapy. We specificallyre addressed that the procedure would compromise urinary control leading to stress incontinence which typically resolves with time and pelvic rehabilitation (Kegel's, etc..), but can sometimes be permanent and require additional therapy including surgery. We also specifically readdressed sexual sequellae including significant erectile dysfunction which typically partially resolves with time but can also be permanent and require additional therapy including surgery.   We rediscussed the typical hospital course including usual 1-2 night hospitalization, discharge with foley catheter in place usually for 1-2 weeks before voiding trial as well as usually 2 week recovery until able to perform most non-strenuous activity and 6 weeks until able to return to most jobs and more strenuous activity such as exercise.   He voiced understanding and desire to proceed today as planned.   Joseph [name] 01/17/2015, 6:07 AM

## 2015-01-17 NOTE — Discharge Instructions (Signed)

## 2015-01-18 LAB — HEMOGLOBIN AND HEMATOCRIT, BLOOD
HCT: 36.4 % — ABNORMAL LOW (ref 39.0–52.0)
Hemoglobin: 12.5 g/dL — ABNORMAL LOW (ref 13.0–17.0)

## 2015-01-18 LAB — BASIC METABOLIC PANEL
ANION GAP: 6 (ref 5–15)
BUN: 17 mg/dL (ref 6–20)
CALCIUM: 8.4 mg/dL — AB (ref 8.9–10.3)
CO2: 26 mmol/L (ref 22–32)
Chloride: 106 mmol/L (ref 101–111)
Creatinine, Ser: 0.9 mg/dL (ref 0.61–1.24)
GFR calc Af Amer: >60 mL/min (ref >60)
GFR calc non Af Amer: >60 mL/min (ref >60)
GLUCOSE: 136 mg/dL — AB (ref 65–99)
Potassium: 4.2 mmol/L (ref 3.5–5.1)
Sodium: 138 mmol/L (ref 135–145)

## 2015-01-18 LAB — CREATININE, FLUID (PLEURAL, PERITONEAL, JP DRAINAGE): Creat, Fluid: 1.1 mg/dL

## 2015-01-18 NOTE — Progress Notes (Signed)
I called [facility] lab directly (first time I called [facility]) and spoke with [place] and she reports the JP creatinine has returned, but it did not cross over. The result is 0.90. I told her the BMP was also 0.9 and she confirmed the separate lab JP-creatinine was 0.9, too. I will have JP removed and d/c patient.

## 2015-01-18 NOTE — Progress Notes (Signed)
  Looks good. Walking laps in the hall and want to go home. JP Cr pending. Reg diet.

## 2015-01-18 NOTE — Discharge Summary (Signed)
Physician Discharge Summary  Patient ID: CARLEY STRICKLING MRN: [id number] DOB/AGE: 1936/04/23 79 y.o.  Admit date: 01/17/2015 Discharge date: 01/18/2015  Admission Diagnoses: Prostate cancer  Discharge Diagnoses:  Active Problems:   Prostate cancer   Discharged Condition: good  Hospital Course: Patient admitted following Robot-assisted Lap radical prostatectomy. On POD#1 he was ambulating, tolerating po and had good pain control. He was stable. His JP Cr was normal and the JP was removed. He was d/c'd to home with foley catheter.   Consults: None  Significant Diagnostic Studies: none    Treatments: surgery: Robotic-assisted Lap Radical Prostatectomy  Discharge Exam: Blood pressure 121/67, pulse 83, temperature 98.1 F (36.7 C), temperature source Oral, resp. rate 16, height 5\' 7"  (1.702 m), weight 72.122 kg (159 lb), SpO2 95 %. NAD Sitting on edge of bed Urine clear JP serosanguineous Ext - no calf pain or swelling   Disposition: 01-Home or Self Care  Discharge Instructions    Discharge patient    Complete by:  As directed   With foley.            Medication List    STOP taking these medications        aspirin 81 MG tablet     naproxen sodium 220 MG tablet  Commonly known as:  ANAPROX      TAKE these medications        acetaminophen 500 MG tablet  Commonly known as:  TYLENOL  Take 500 mg by mouth every 4 (four) hours as needed for moderate pain or headache.     HYDROcodone-acetaminophen 5-325 MG per tablet  Commonly known as:  NORCO  Take 1-2 tablets by mouth every 6 (six) hours as needed.     naphazoline 0.1 % ophthalmic solution  Commonly known as:  NAPHCON  Place 1 drop into both eyes daily as needed for irritation or allergies.     sulfamethoxazole-trimethoprim 800-160 MG per tablet  Commonly known as:  BACTRIM DS,SEPTRA DS  Take 1 tablet by mouth 2 (two) times daily. Start the day prior to foley removal appointment     SYSTANE PRESERVATIVE FREE  0.4-0.3 % Soln  Generic drug:  Polyethyl Glycol-Propyl Glycol  Apply 1-2 drops to eye as needed (dry eyes).           Follow-up Information    Follow up with Alexis Frock, MD On 01/27/2015.   Specialty:  Urology   Why:  at 8:15   Contact information:   [address] [place] [id number] [phone]       Signed: Festus Aloe 01/18/2015, 3:45 PM

## 2015-01-18 NOTE — Op Note (Signed)
NAMEDNAIEL, VOLLER NO.:  [id number]  MEDICAL RECORD NO.:  [id number]  LOCATION:  8280                         FACILITY:  [facility]  PHYSICIAN:  Alexis Frock, MD     DATE OF BIRTH:  01/31/1936  DATE OF PROCEDURE: 01/17/2015                              OPERATIVE REPORT  DIAGNOSIS:  Moderate-risk prostate cancer.  PROCEDURES: 1. Robotic-assisted laparoscopic radical prostatectomy. 2. Bilateral pelvic lymphadenectomy. 3. Injection of an indocyanine green dye for sentinel     lymphangiography.  ESTIMATED BLOOD LOSS:  100 mL.  COMPLICATIONS:  None.  SPECIMENS: 1. Periprostatic fat. 2. Radical prostatectomy. 3. Right external iliac lymph nodes. 4. Right obturator lymph nodes. 5. Right common iliac lymph node, sentinel. 6. Left external iliac lymph nodes. 7. Left obturator lymph nodes. 8. Left common iliac lymph node, sentinel.  ASSISTANT: 1. Clemetine Marker, PA. 2. Gypsy Lore, MD.  DRAINS: 1. Jackson-Pratt drain to bulb suction. 2. Foley catheter to straight drain.  FINDINGS:  Hyperfluorescent sentinel lymph nodes in the area close to the distal most common iliac artery bilaterally.  This was just lateral to the ureter.  INDICATION:  Mr. Gee is a very pleasant 79 year old gentleman, who was quite vigorous for his age.  He was found on workup of elevated PSA to have multifocal moderate-risk prostate cancer.  He is minimally sexually active.  Options were discussed for definitive management including surveillance protocols versus ablative therapies versus surgery with and without minimally invasive assistance and he wished to proceed with robotic prostatectomy with pelvic lymphadenectomy. Informed consent was obtained and placed in the medical record.  PROCEDURE IN DETAIL:  The patient being in [place], was verified. Procedure being robotic prostatectomy was confirmed.  Procedure was carried out.  Time-out was performed.  Intravenous  antibiotics were administered.  General endotracheal anesthesia was introduced.  The patient was placed into a low lithotomy position and sterile field was created by prepping and draping the patient's penis, perineum and proximal thighs using iodine x3 in his infra-xiphoid abdomen using chlorhexidine gluconate.  He was placed on to a pink, non-slide pad. His arms were tucked to the side.  After padding appropriately, a test of steep Trendelenburg positioning was performed and he was found to be suitably positioned.  Next, a high-flow, low-pressure pneumoperitoneum was obtained using Veress technique in the infraumbilical midline having passed the aspiration and drop test.  A 12-mm robotic camera port was placed in the same location.  Laparoscopic examination of the peritoneal cavity revealed no significant adhesions, no visceral injury. Additional ports were then placed as follows; right paramedian 8-mm robotic port, right paramedian 5-mm suction port, right far lateral 12- mm assistant port, left paramedian 8-mm robotic port, left far lateral 8- mm robotic port.  Robot was docked and passed through the electronic checks.  Initial attention was directed at development of space of Retzius.  Incision was made lateral to the left medial umbilical ligament from the midline towards the area of the internal ring and coursing along the iliac vessels towards the area of the ureter.  Vas deferens was encountered during these maneuvers and transected and used a bucket handle for medial  traction on the bladder.  The left lateral bladder was then swept away from the pelvic sidewall towards the area of the endopelvic fascia, which was carefully swept away from the lateral aspect of the prostate in a base-to-apex orientation.  Mirror imaged dissection was performed on the right side sweeping the right bladder away from the pelvic sidewall, ligating the right vas deferens and sweeping the right  endopelvic fascia away from the lateral aspect of the prostate.  Anterior bladder attachments were taken down using cautery scissors.  This exposing the anterior base of the prostate, which was carefully defatted to allow better visualization of the bladder neck and prostate junction.  The fat in this location was set aside for permanent pathology, labeled periprostatic fat.  Next, 0.2 mL of indocyanine green dye were given via a robotically-guided percutaneously placed spinal needle with 0.2 mL of dye into each lobe of the prostate with intervening suction to avoid the spillage, which did not occur.  The dorsal venous complex was identified and controlled using vascular load stapler, which resulted in excellent hemostatic control of the structure.  The pelvis was then inspected under near infrared fluorescent light.  Inspection of the pelvis performing sentinel lymph angiography revealed multiple small lymphatic channels and a single dominant hyperfluorescent node just lateral to the distal ureter at the distal most area of the common iliac artery bilaterally.  There were no obvious fluorescent node using the traditional lymph node packet of external iliac and obturator nodes respectively.  As such, template lymphadenectomy was first performed on the left side of the left external iliac lymph node group with confines being external iliac artery, vein, pelvic sidewall and iliac bifurcation.  Lymphostasis was achieved with cold clips. Similarly, the left obturator group nodes were dissected free with confines being left external iliac vein, obturator nerve, pelvic side wall.  Lymphostasis was also achieved with cold clips.  The obturator nerve was inspected following these maneuvers and found to be completely intact.  The single dominant hyperfluorescent lymph node that was again just lateral to the ureter was carefully dissected free and lymphostasis was achieved with cold clips.  This  was set aside and labeled left common iliac lymph node, sentinel.  A mirror imaged lymphangiography was performed on the right side of the exact same lymph node groups and the right sentinel lymph node respectively.  The right obturator was also inspected following these maneuvers and found to be visibly intact. Next, bladder neck dissection was performed by identifying the bladder neck by removing the Foley catheter back and forth and in the anterior plane.  Dissection proceeded in the anterior-posterior direction, separating the bladder neck away from the anterior base of the prostate. Taken great care to avoid excessive caliber of bladder neck.  Posterior dissection was performed by incising approximately 7 mm inferior to the posterior lip of the bladder and dissecting within the plane of Denonvilliers.  Bilateral seminal vesicles were encountered as well as the bilateral vas deferens.  The vas deferens was dissected for distance of 4 cm, ligated and placed on gentle superior traction.  Bilateral seminal vesicles were dissected toward their tips and also placed in gentle superior traction.  The plane of Denonvilliers was further developed towards the apex of the prostate.  This exposed the vascular pedicles bilaterally.  These were controlled using the sequential clipping technique in a base-to-apex orientation with only moderate nerve sparing being performed given the patient's moderate-risk disease and minimal current sexual activity.  Apical dissection  was performed in the anterior plane by identifying the membranous urethra and transecting coldly.  This completely freed up the prostatectomy specimen, which was placed in an EndoCatch bag for later retrieval.  Digital rectal exam was performed under laparoscopic vision using indicator glove and no rectal violation was encountered.  Posterior urethral dissection was performed using a single V-Loc suture reapproximating the posterior  urethral plate to the posterior bladder neck bringing the strictures in tension-free apposition.  Urethra-to-bladder neck anastomosis was performed using double-armed V-Loc suture from the 6 o'clock to 12 o'clock position providing excellent mucosal apposition.  The same stitch was anchored to the puboprostatic ligament, just also performing an anterior reconstruction.  Following these maneuvers, hemostasis appeared excellent.  All sponge and needle counts were correct.  The Foley catheter was irrigated quantitatively.  Close suction drain was brought through the previous left lateral robotic port site near the peritoneal cavity.  The previous right 12-mm assistant port was closed at the level of fascia using Carter-Thomason suture passer under laparoscopic vision. Specimen was retrieved by extending the previous port site for total distance of approximately 4 cm removing the prostatectomy specimen and setting aside for permanent pathology.  The extraction site was closed at the level of fascia using figure-of-eight PDS x3.  All incision sites were infiltrated with dilute lyophilized Marcaine and closed at the level of the skin using subcuticular Monocryl followed by Dermabond. Procedure was then terminated.  The patient tolerated the procedure well.  There were no immediate periprocedural complications.  The patient was taken to the postanesthesia care unit in stable condition.          ______________________________ Alexis Frock, MD     TM/MEDQ  D:  01/17/2015  T:  01/18/2015  Job:  [id number]

## 2015-01-20 ENCOUNTER — Encounter (HOSPITAL_COMMUNITY): Payer: Self-pay | Admitting: Urology

## 2015-02-06 DIAGNOSIS — R278 Other lack of coordination: Secondary | ICD-10-CM | POA: Diagnosis not present

## 2015-02-06 DIAGNOSIS — M62838 Other muscle spasm: Secondary | ICD-10-CM | POA: Diagnosis not present

## 2015-02-06 DIAGNOSIS — M6281 Muscle weakness (generalized): Secondary | ICD-10-CM | POA: Diagnosis not present

## 2015-02-21 DIAGNOSIS — M62838 Other muscle spasm: Secondary | ICD-10-CM | POA: Diagnosis not present

## 2015-02-21 DIAGNOSIS — R278 Other lack of coordination: Secondary | ICD-10-CM | POA: Diagnosis not present

## 2015-02-21 DIAGNOSIS — M6281 Muscle weakness (generalized): Secondary | ICD-10-CM | POA: Diagnosis not present

## 2015-02-21 DIAGNOSIS — N3946 Mixed incontinence: Secondary | ICD-10-CM | POA: Diagnosis not present

## 2015-02-28 DIAGNOSIS — M62838 Other muscle spasm: Secondary | ICD-10-CM | POA: Diagnosis not present

## 2015-02-28 DIAGNOSIS — N3946 Mixed incontinence: Secondary | ICD-10-CM | POA: Diagnosis not present

## 2015-02-28 DIAGNOSIS — R278 Other lack of coordination: Secondary | ICD-10-CM | POA: Diagnosis not present

## 2015-02-28 DIAGNOSIS — M6281 Muscle weakness (generalized): Secondary | ICD-10-CM | POA: Diagnosis not present

## 2015-03-04 DIAGNOSIS — C61 Malignant neoplasm of prostate: Secondary | ICD-10-CM | POA: Diagnosis not present

## 2015-03-11 DIAGNOSIS — N393 Stress incontinence (female) (male): Secondary | ICD-10-CM | POA: Diagnosis not present

## 2015-03-11 DIAGNOSIS — N3946 Mixed incontinence: Secondary | ICD-10-CM | POA: Diagnosis not present

## 2015-03-11 DIAGNOSIS — M62838 Other muscle spasm: Secondary | ICD-10-CM | POA: Diagnosis not present

## 2015-03-11 DIAGNOSIS — R278 Other lack of coordination: Secondary | ICD-10-CM | POA: Diagnosis not present

## 2015-03-11 DIAGNOSIS — M6281 Muscle weakness (generalized): Secondary | ICD-10-CM | POA: Diagnosis not present

## 2015-03-24 DIAGNOSIS — N3946 Mixed incontinence: Secondary | ICD-10-CM | POA: Diagnosis not present

## 2015-03-24 DIAGNOSIS — M62838 Other muscle spasm: Secondary | ICD-10-CM | POA: Diagnosis not present

## 2015-03-24 DIAGNOSIS — R278 Other lack of coordination: Secondary | ICD-10-CM | POA: Diagnosis not present

## 2015-03-24 DIAGNOSIS — M6281 Muscle weakness (generalized): Secondary | ICD-10-CM | POA: Diagnosis not present

## 2015-03-27 DIAGNOSIS — Z23 Encounter for immunization: Secondary | ICD-10-CM | POA: Diagnosis not present

## 2015-03-27 DIAGNOSIS — M545 Low back pain: Secondary | ICD-10-CM | POA: Diagnosis not present

## 2015-03-31 DIAGNOSIS — M6281 Muscle weakness (generalized): Secondary | ICD-10-CM | POA: Diagnosis not present

## 2015-03-31 DIAGNOSIS — R278 Other lack of coordination: Secondary | ICD-10-CM | POA: Diagnosis not present

## 2015-03-31 DIAGNOSIS — M62838 Other muscle spasm: Secondary | ICD-10-CM | POA: Diagnosis not present

## 2015-03-31 DIAGNOSIS — N393 Stress incontinence (female) (male): Secondary | ICD-10-CM | POA: Diagnosis not present

## 2015-04-09 DIAGNOSIS — R278 Other lack of coordination: Secondary | ICD-10-CM | POA: Diagnosis not present

## 2015-04-09 DIAGNOSIS — M62838 Other muscle spasm: Secondary | ICD-10-CM | POA: Diagnosis not present

## 2015-04-09 DIAGNOSIS — M6281 Muscle weakness (generalized): Secondary | ICD-10-CM | POA: Diagnosis not present

## 2015-04-09 DIAGNOSIS — N393 Stress incontinence (female) (male): Secondary | ICD-10-CM | POA: Diagnosis not present

## 2015-04-09 DIAGNOSIS — N3946 Mixed incontinence: Secondary | ICD-10-CM | POA: Diagnosis not present

## 2015-04-23 DIAGNOSIS — R312 Other microscopic hematuria: Secondary | ICD-10-CM | POA: Diagnosis not present

## 2015-04-23 DIAGNOSIS — R310 Gross hematuria: Secondary | ICD-10-CM | POA: Diagnosis not present

## 2015-04-23 DIAGNOSIS — C61 Malignant neoplasm of prostate: Secondary | ICD-10-CM | POA: Diagnosis not present

## 2015-04-29 DIAGNOSIS — N5201 Erectile dysfunction due to arterial insufficiency: Secondary | ICD-10-CM | POA: Diagnosis not present

## 2015-04-29 DIAGNOSIS — C61 Malignant neoplasm of prostate: Secondary | ICD-10-CM | POA: Diagnosis not present

## 2015-04-29 DIAGNOSIS — R310 Gross hematuria: Secondary | ICD-10-CM | POA: Diagnosis not present

## 2015-04-29 DIAGNOSIS — N393 Stress incontinence (female) (male): Secondary | ICD-10-CM | POA: Diagnosis not present

## 2015-04-30 DIAGNOSIS — N393 Stress incontinence (female) (male): Secondary | ICD-10-CM | POA: Diagnosis not present

## 2015-04-30 DIAGNOSIS — M6281 Muscle weakness (generalized): Secondary | ICD-10-CM | POA: Diagnosis not present

## 2015-04-30 DIAGNOSIS — M62838 Other muscle spasm: Secondary | ICD-10-CM | POA: Diagnosis not present

## 2015-04-30 DIAGNOSIS — R278 Other lack of coordination: Secondary | ICD-10-CM | POA: Diagnosis not present

## 2015-05-07 DIAGNOSIS — M62838 Other muscle spasm: Secondary | ICD-10-CM | POA: Diagnosis not present

## 2015-05-07 DIAGNOSIS — R278 Other lack of coordination: Secondary | ICD-10-CM | POA: Diagnosis not present

## 2015-05-07 DIAGNOSIS — N3946 Mixed incontinence: Secondary | ICD-10-CM | POA: Diagnosis not present

## 2015-05-07 DIAGNOSIS — M6200 Separation of muscle (nontraumatic), unspecified site: Secondary | ICD-10-CM | POA: Diagnosis not present

## 2015-05-07 DIAGNOSIS — M6281 Muscle weakness (generalized): Secondary | ICD-10-CM | POA: Diagnosis not present

## 2015-05-19 DIAGNOSIS — N289 Disorder of kidney and ureter, unspecified: Secondary | ICD-10-CM | POA: Diagnosis not present

## 2015-05-19 DIAGNOSIS — Z125 Encounter for screening for malignant neoplasm of prostate: Secondary | ICD-10-CM | POA: Diagnosis not present

## 2015-05-19 DIAGNOSIS — Z7982 Long term (current) use of aspirin: Secondary | ICD-10-CM | POA: Diagnosis not present

## 2015-05-19 DIAGNOSIS — Z0000 Encounter for general adult medical examination without abnormal findings: Secondary | ICD-10-CM | POA: Diagnosis not present

## 2015-05-19 DIAGNOSIS — I10 Essential (primary) hypertension: Secondary | ICD-10-CM | POA: Diagnosis not present

## 2015-05-23 DIAGNOSIS — Z8546 Personal history of malignant neoplasm of prostate: Secondary | ICD-10-CM | POA: Diagnosis not present

## 2015-05-23 DIAGNOSIS — M47817 Spondylosis without myelopathy or radiculopathy, lumbosacral region: Secondary | ICD-10-CM | POA: Diagnosis not present

## 2015-05-23 DIAGNOSIS — R7303 Prediabetes: Secondary | ICD-10-CM | POA: Diagnosis not present

## 2015-05-23 DIAGNOSIS — I482 Chronic atrial fibrillation: Secondary | ICD-10-CM | POA: Diagnosis not present

## 2015-05-26 DIAGNOSIS — Z1212 Encounter for screening for malignant neoplasm of rectum: Secondary | ICD-10-CM | POA: Diagnosis not present

## 2015-05-27 ENCOUNTER — Ambulatory Visit (INDEPENDENT_AMBULATORY_CARE_PROVIDER_SITE_OTHER): Payer: Medicare Other | Admitting: Nurse Practitioner

## 2015-05-27 ENCOUNTER — Encounter: Payer: Self-pay | Admitting: Nurse Practitioner

## 2015-05-27 VITALS — BP 142/74 | HR 69 | Ht 66.5 in | Wt 159.0 lb

## 2015-05-27 DIAGNOSIS — I480 Paroxysmal atrial fibrillation: Secondary | ICD-10-CM

## 2015-05-27 DIAGNOSIS — I4891 Unspecified atrial fibrillation: Secondary | ICD-10-CM | POA: Diagnosis not present

## 2015-05-27 MED ORDER — METOPROLOL TARTRATE 25 MG PO TABS: 25.0000 mg | ORAL_TABLET | Freq: Two times a day (BID) | ORAL | Status: DC

## 2015-05-27 NOTE — Patient Instructions (Addendum)
We will be checking the following labs today - NONE   Medication Instructions:    Continue with your current medicines. I am   Adding Lopressor 25 mg to take twice a day - this is at your drug store    Testing/Procedures To Be Arranged:  Echocardiogram  Follow-Up:   See Dr. Mare Ferrari in 6 weeks    Other Special Instructions:   N/A    Atrial Fibrillation Atrial fibrillation is a type of irregular or rapid heartbeat (arrhythmia). In atrial fibrillation, the heart quivers continuously in a chaotic pattern. This occurs when parts of the heart receive disorganized signals that make the heart unable to pump blood normally. This can increase the risk for stroke, heart failure, and other heart-related conditions. There are different types of atrial fibrillation, including:  Paroxysmal atrial fibrillation. This type starts suddenly, and it usually stops on its own shortly after it starts.  Persistent atrial fibrillation. This type often lasts longer than a week. It may stop on its own or with treatment.  Long-lasting persistent atrial fibrillation. This type lasts longer than 12 months.  Permanent atrial fibrillation. This type does not go away. Talk with your health care provider to learn about the type of atrial fibrillation that you have. CAUSES This condition is caused by some heart-related conditions or procedures, including:  A heart attack.  Coronary artery disease.  Heart failure.  Heart valve conditions.  High blood pressure.  Inflammation of the sac that surrounds the heart (pericarditis).  Heart surgery.  Certain heart rhythm disorders, such as Wolf-Parkinson-White syndrome. Other causes include:  Pneumonia.  Obstructive sleep apnea.  Blockage of an artery in the lungs (pulmonary embolism, or PE).  Lung cancer.  Chronic lung disease.  Thyroid problems, especially if the thyroid is overactive (hyperthyroidism).  Caffeine.  Excessive alcohol  use or illegal drug use.  Use of some medicines, including certain decongestants and diet pills. Sometimes, the cause cannot be found. RISK FACTORS This condition is more likely to develop in:  People who are older in age.  People who smoke.  People who have diabetes mellitus.  People who are overweight (obese).  Athletes who exercise vigorously. SYMPTOMS Symptoms of this condition include:  A feeling that your heart is beating rapidly or irregularly.  A feeling of discomfort or pain in your chest.  Shortness of breath.  Sudden light-headedness or weakness.  Getting tired easily during exercise. In some cases, there are no symptoms. DIAGNOSIS Your health care provider may be able to detect atrial fibrillation when taking your pulse. If detected, this condition may be diagnosed with:  An electrocardiogram (ECG).  A Holter monitor test that records your heartbeat patterns over a 24-hour period.  Transthoracic echocardiogram (TTE) to evaluate how blood flows through your heart.  Transesophageal echocardiogram (TEE) to view more detailed images of your heart.  A stress test.  Imaging tests, such as a CT scan or chest X-ray.  Blood tests. TREATMENT The main goals of treatment are to prevent blood clots from forming and to keep your heart beating at a normal rate and rhythm. The type of treatment that you receive depends on many factors, such as your underlying medical conditions and how you feel when you are experiencing atrial fibrillation. This condition may be treated with:  Medicine to slow down the heart rate, bring the heart's rhythm back to normal, or prevent clots from forming.  Electrical cardioversion. This is a procedure that resets your heart's rhythm by delivering a controlled,  low-energy shock to the heart through your skin.  Different types of ablation, such as catheter ablation, catheter ablation with pacemaker, or surgical ablation. These procedures  destroy the heart tissues that send abnormal signals. When the pacemaker is used, it is placed under your skin to help your heart beat in a regular rhythm. HOME CARE INSTRUCTIONS  Take over-the counter and prescription medicines only as told by your health care provider.  If your health care provider prescribed a blood-thinning medicine (anticoagulant), take it exactly as told. Taking too much blood-thinning medicine can cause bleeding. If you do not take enough blood-thinning medicine, you will not have the protection that you need against stroke and other problems.  Do not use tobacco products, including cigarettes, chewing tobacco, and e-cigarettes. If you need help quitting, ask your health care provider.  If you have obstructive sleep apnea, manage your condition as told by your health care provider.  Do not drink alcohol.  Do not drink beverages that contain caffeine, such as coffee, soda, and tea.  Maintain a healthy weight. Do not use diet pills unless your health care provider approves. Diet pills may make heart problems worse.  Follow diet instructions as told by your health care provider.  Exercise regularly as told by your health care provider.  Keep all follow-up visits as told by your health care provider. This is important. PREVENTION  Avoid drinking beverages that contain caffeine or alcohol.  Avoid certain medicines, especially medicines that are used for breathing problems.  Avoid certain herbs and herbal medicines, such as those that contain ephedra or ginseng.  Do not use illegal drugs, such as cocaine and amphetamines.  Do not smoke.  Manage your high blood pressure. SEEK MEDICAL CARE IF:  You notice a change in the rate, rhythm, or strength of your heartbeat.  You are taking an anticoagulant and you notice increased bruising.  You tire more easily when you exercise or exert yourself. SEEK IMMEDIATE MEDICAL CARE IF:  You have chest pain, abdominal  pain, sweating, or weakness.  You feel nauseous.  You notice blood in your vomit, bowel movement, or urine.  You have shortness of breath.  You suddenly have swollen feet and ankles.  You feel dizzy.  You have sudden weakness or numbness of the face, arm, or leg, especially on one side of the body.  You have trouble speaking, trouble understanding, or both (aphasia).  Your face or your eyelid droops on one side. These symptoms may represent a serious problem that is an emergency. Do not wait to see if the symptoms will go away. Get medical help right away. Call your local emergency services (911 in the [place] Do not drive yourself to the hospital.   This information is not intended to replace advice given to you by your health care provider. Make sure you discuss any questions you have with your health care provider.    Call the [place] office at [phone] if you have any questions, problems or concerns.

## 2015-05-27 NOTE — Progress Notes (Signed)
CARDIOLOGY OFFICE NOTE  Date:  05/27/2015    Joseph [name] Date of Birth: 09/08/1935 Medical Record #[id number]  PCP:  Horatio Pel, MD  Cardiologist:  Mare Ferrari    Chief Complaint  Patient presents with  . Atrial Fibrillation    Follow up visit - seen for Dr. Mare Ferrari    History of Present Illness: Joseph [name] is a 79 y.o. male who presents today for a work in visit. Seen for Dr. Mare Ferrari. He has a history of PAF with prior cardioversion in 2012. Other issues include prostate cancer.   Last seen in June and felt to be doing ok.  Comes back today. Here alone. Here because he had his physical last week -  was back in atrial fib. Had prostate removed back in June and apparently no issue. He has been started back on Pradaxa. Now off aspirin. He notes that his heart rate was elevated last week but he has no idea of the duration. He was totally asymptomatic. Remains pretty active. Likes to play tennis. Recent labs stable. BP typically not an issue. CHADSVASC is 2 (age).   Past Medical History  Diagnosis Date  . Atrial fibrillation ([place])     resolved with cardioversion  . Sigmoid diverticulitis April of 2012    with small perforation  . Complication of anesthesia   . PONV (postoperative nausea and vomiting)   . Arthritis   . History of skin cancer   . Squamous cell carcinoma ([place])   . Prostate cancer ([place])   . Headache     occasional    Past Surgical History  Procedure Laterality Date  . Shoulder surgery      x 2  . Nasal septum surgery    . Lasik    . Cardioversion  01/2009  . Robot assisted laparoscopic radical prostatectomy N/A 01/17/2015    Procedure: ROBOTIC ASSISTED LAPAROSCOPIC RADICAL PROSTATECTOMY WITH INDOCYANINE GREEN DYE INJECTION;  Surgeon: Alexis Frock, MD;  Location: [facility];  Service: Urology;  Laterality: N/A;  . Lymphadenectomy Bilateral 01/17/2015    Procedure: PELVIC LYMPHADENECTOMY;  Surgeon: Alexis Frock, MD;  Location: [facility];   Service: Urology;  Laterality: Bilateral;     Medications: Current Outpatient Prescriptions  Medication Sig Dispense Refill  . acetaminophen (TYLENOL) 500 MG tablet Take 500 mg by mouth every 4 (four) hours as needed for moderate pain or headache.     . dabigatran (PRADAXA) 150 MG CAPS capsule Take 150 mg by mouth 2 (two) times daily.    . metoprolol tartrate (LOPRESSOR) 25 MG tablet Take 1 tablet (25 mg total) by mouth 2 (two) times daily. 180 tablet 3   No current facility-administered medications for this visit.    Allergies: No Known Allergies  Social History: The patient  reports that he has never smoked. He has never used smokeless tobacco. He reports that he drinks alcohol. He reports that he does not use illicit drugs.   Family History: The patient's family history includes Other in his father and mother.   Review of Systems: Please see the history of present illness.   Otherwise, the review of systems is positive for none.   All other systems are reviewed and negative.   Physical Exam: VS:  BP 142/74 mmHg  Pulse 69  Ht 5' 6.5" (1.689 m)  Wt 159 lb (72.122 kg)  BMI 25.28 kg/m2 .  BMI Body mass index is 25.28 kg/(m^2).  Wt Readings from Last 3 Encounters:  05/27/15 159  lb (72.122 kg)  01/17/15 159 lb (72.122 kg)  01/15/15 159 lb 1.9 oz (72.176 kg)    General: Pleasant. Well developed, well nourished and in no acute distress.  HEENT: Normal. Neck: Supple, no JVD, carotid bruits, or masses noted.  Cardiac: Regular rate and rhythm. No murmurs, rubs, or gallops. No edema.  Respiratory:  Lungs are clear to auscultation bilaterally with normal work of breathing.  GI: Soft and nontender.  MS: No deformity or atrophy. Gait and ROM intact. Skin: Warm and dry. Color is normal.  Neuro:  Strength and sensation are intact and no gross focal deficits noted.  Psych: Alert, appropriate and with normal affect.   LABORATORY DATA:  EKG:  EKG is ordered today. This  demonstrates NSR today.  Lab Results  Component Value Date   WBC 6.4 01/10/2015   HGB 12.5* 01/18/2015   HCT 36.4* 01/18/2015   PLT 233 01/10/2015   GLUCOSE 136* 01/18/2015   ALT 15 11/25/2010   AST 18 11/25/2010   NA 138 01/18/2015   K 4.2 01/18/2015   CL 106 01/18/2015   CREATININE 0.90 01/18/2015   BUN 17 01/18/2015   CO2 26 01/18/2015   TSH 1.71 12/16/2010   INR 1.1* 01/12/2011    BNP (last 3 results) No results for input(s): BNP in the last 8760 hours.  ProBNP (last 3 results) No results for input(s): PROBNP in the last 8760 hours.   Other Studies Reviewed Today:  Echo Study Conclusions from 2012  - Left ventricle: The cavity size was normal. Wall thickness was normal. Systolic function was normal. The estimated ejection fraction was in the range of 55% to 60%. Wall motion was normal; there were no regional wall motion abnormalities. Left ventricular diastolic function parameters were normal. - Mitral valve: Mild regurgitation. - Left atrium: The atrium was mildly dilated. - Right atrium: The atrium was mildly dilated.  Assessment/Plan: 1. PAF - would manage with rate control and anticoagulation. He is back on Pradaxa. I am adding Lopressor 25 mg BID. He is pretty asymptomatic - do not feel AAD therapy or ablation warranted. Will get his echo updated.   2. Prostate cancer - has had recent prostatectomy  3. Elevated BP - would follow for now.   Current medicines are reviewed with the patient today.  The patient does not have concerns regarding medicines other than what has been noted above.  The following changes have been made:  See above.  Labs/ tests ordered today include:    Orders Placed This Encounter  Procedures  . EKG 12-Lead  . ECHOCARDIOGRAM COMPLETE     Disposition:   FU with Dr. Mare Ferrari in 6 weeks.   Patient is agreeable to this plan and will call if any problems develop in the interim.   Signed: Burtis Junes, RN,  ANP-C 05/27/2015 2:28 PM  [facility] [address] [place], [place]  [address] Phone: [phone] Fax: [phone]

## 2015-05-28 DIAGNOSIS — R278 Other lack of coordination: Secondary | ICD-10-CM | POA: Diagnosis not present

## 2015-05-28 DIAGNOSIS — N3946 Mixed incontinence: Secondary | ICD-10-CM | POA: Diagnosis not present

## 2015-05-28 DIAGNOSIS — M6281 Muscle weakness (generalized): Secondary | ICD-10-CM | POA: Diagnosis not present

## 2015-05-28 DIAGNOSIS — M62838 Other muscle spasm: Secondary | ICD-10-CM | POA: Diagnosis not present

## 2015-05-28 DIAGNOSIS — M6200 Separation of muscle (nontraumatic), unspecified site: Secondary | ICD-10-CM | POA: Diagnosis not present

## 2015-05-30 ENCOUNTER — Other Ambulatory Visit: Payer: Self-pay

## 2015-05-30 ENCOUNTER — Ambulatory Visit (HOSPITAL_COMMUNITY): Payer: Medicare Other | Attending: Cardiovascular Disease

## 2015-05-30 DIAGNOSIS — I5189 Other ill-defined heart diseases: Secondary | ICD-10-CM | POA: Diagnosis not present

## 2015-05-30 DIAGNOSIS — I340 Nonrheumatic mitral (valve) insufficiency: Secondary | ICD-10-CM | POA: Insufficient documentation

## 2015-05-30 DIAGNOSIS — I480 Paroxysmal atrial fibrillation: Secondary | ICD-10-CM

## 2015-05-30 DIAGNOSIS — I4891 Unspecified atrial fibrillation: Secondary | ICD-10-CM | POA: Diagnosis not present

## 2015-05-30 DIAGNOSIS — I358 Other nonrheumatic aortic valve disorders: Secondary | ICD-10-CM | POA: Insufficient documentation

## 2015-05-30 DIAGNOSIS — I10 Essential (primary) hypertension: Secondary | ICD-10-CM | POA: Insufficient documentation

## 2015-06-04 DIAGNOSIS — R278 Other lack of coordination: Secondary | ICD-10-CM | POA: Diagnosis not present

## 2015-06-04 DIAGNOSIS — M62838 Other muscle spasm: Secondary | ICD-10-CM | POA: Diagnosis not present

## 2015-06-04 DIAGNOSIS — M6281 Muscle weakness (generalized): Secondary | ICD-10-CM | POA: Diagnosis not present

## 2015-06-04 DIAGNOSIS — N3946 Mixed incontinence: Secondary | ICD-10-CM | POA: Diagnosis not present

## 2015-06-04 DIAGNOSIS — C61 Malignant neoplasm of prostate: Secondary | ICD-10-CM | POA: Diagnosis not present

## 2015-06-19 DIAGNOSIS — N393 Stress incontinence (female) (male): Secondary | ICD-10-CM | POA: Diagnosis not present

## 2015-06-19 DIAGNOSIS — N5201 Erectile dysfunction due to arterial insufficiency: Secondary | ICD-10-CM | POA: Diagnosis not present

## 2015-06-19 DIAGNOSIS — C61 Malignant neoplasm of prostate: Secondary | ICD-10-CM | POA: Diagnosis not present

## 2015-07-10 ENCOUNTER — Ambulatory Visit: Payer: Medicare Other | Admitting: Cardiology

## 2015-07-14 DIAGNOSIS — H11041 Peripheral pterygium, stationary, right eye: Secondary | ICD-10-CM | POA: Diagnosis not present

## 2015-07-14 DIAGNOSIS — H2513 Age-related nuclear cataract, bilateral: Secondary | ICD-10-CM | POA: Diagnosis not present

## 2015-07-15 ENCOUNTER — Other Ambulatory Visit: Payer: Self-pay | Admitting: Physician Assistant

## 2015-07-15 DIAGNOSIS — L570 Actinic keratosis: Secondary | ICD-10-CM | POA: Diagnosis not present

## 2015-07-15 DIAGNOSIS — D485 Neoplasm of uncertain behavior of skin: Secondary | ICD-10-CM | POA: Diagnosis not present

## 2015-08-28 ENCOUNTER — Ambulatory Visit (INDEPENDENT_AMBULATORY_CARE_PROVIDER_SITE_OTHER): Payer: Medicare Other | Admitting: Cardiology

## 2015-08-28 ENCOUNTER — Encounter: Payer: Self-pay | Admitting: Cardiology

## 2015-08-28 VITALS — BP 130/66 | HR 57 | Ht 67.0 in | Wt 162.8 lb

## 2015-08-28 DIAGNOSIS — I480 Paroxysmal atrial fibrillation: Secondary | ICD-10-CM

## 2015-08-28 MED ORDER — DABIGATRAN ETEXILATE MESYLATE 150 MG PO CAPS: 150.0000 mg | ORAL_CAPSULE | Freq: Two times a day (BID) | ORAL | Status: DC

## 2015-08-28 NOTE — Patient Instructions (Signed)
Medication Instructions:  Your physician recommends that you continue on your current medications as directed. Please refer to the Current Medication list given to you today.  Labwork: NONE  Testing/Procedures: NONE  Follow-Up: Your physician recommends that you schedule a follow-up appointment in: [place]  If you need a refill on your cardiac medications before your next appointment, please call your pharmacy.

## 2015-08-28 NOTE — Progress Notes (Signed)
Cardiology Office Note   Date:  08/28/2015   ID:  Joseph [name], DOB 1935-12-30, MRN [id number]  PCP:  Horatio Pel, MD  Cardiologist: Darlin Coco MD  Chief Complaint  Patient presents with  . routine office visit    Patient denies chest pain, shortness of breath, le edema, or claudication      History of Present Illness: Joseph [name] is a 80 y.o. male who presents for scheduled follow-up visit.  He has a past history of paroxysmal atrial fibrillation which was discovered during a postoperative checkup in 2012. On 01/14/11 he underwent successful direct-current cardioversion. The patient has had no recurrent atrial fibrillation. He is physically active and plays tennis 2 or 3 times a week. the patient has a past history of prostate cancer and has had surgery in 2016. The patient had a physical with Dr. Shelia Media in October 2016 and was found to be back in atrial fibrillation.the patient was totally asymptomatic and unaware of his arrhythmia.  Dr. Shelia Media started him on Pradaxa. he was subsequently seen by Truitt Merle.the strategy would be long-term anticoagulation for a chadsvasc score of 2 for age. He remains asymptomatic from his heart.  He is tolerating Lopressor 25 mg twice a day.  He has had some occasional gross hematuria following exercise related to his previous prostate cancer surgery.  He is having some problem with incontinence and is having to wear pads.  He has stopped playing tennis for a while because of the stress-induced hematuria.   Past Medical History  Diagnosis Date  . Atrial fibrillation ([place])     resolved with cardioversion  . Sigmoid diverticulitis April of 2012    with small perforation  . Complication of anesthesia   . PONV (postoperative nausea and vomiting)   . Arthritis   . History of skin cancer   . Squamous cell carcinoma ([place])   . Prostate cancer ([place])   . Headache     occasional    Past Surgical History  Procedure Laterality Date    . Shoulder surgery      x 2  . Nasal septum surgery    . Lasik    . Cardioversion  01/2009  . Robot assisted laparoscopic radical prostatectomy N/A 01/17/2015    Procedure: ROBOTIC ASSISTED LAPAROSCOPIC RADICAL PROSTATECTOMY WITH INDOCYANINE GREEN DYE INJECTION;  Surgeon: Alexis Frock, MD;  Location: [facility];  Service: Urology;  Laterality: N/A;  . Lymphadenectomy Bilateral 01/17/2015    Procedure: PELVIC LYMPHADENECTOMY;  Surgeon: Alexis Frock, MD;  [facility];  Service: Urology;  Laterality: Bilateral;     Current Outpatient Prescriptions  Medication Sig Dispense Refill  . acetaminophen (TYLENOL) 500 MG tablet Take 500 mg by mouth every 4 (four) hours as needed for moderate pain or headache.     . dabigatran (PRADAXA) 150 MG CAPS capsule Take 1 capsule (150 mg total) by mouth 2 (two) times daily. 60 capsule 5  . metoprolol tartrate (LOPRESSOR) 25 MG tablet Take 1 tablet (25 mg total) by mouth 2 (two) times daily. 180 tablet 3   No current facility-administered medications for this visit.    Allergies:   Review of patient's allergies indicates no known allergies.    Social History:  The patient  reports that he has never smoked. He has never used smokeless tobacco. He reports that he drinks alcohol. He reports that he does not use illicit drugs.   Family History:  The patient's family history includes Other in his father  and mother.    ROS:  Please see the history of present illness.   Otherwise, review of systems are positive for none.   All other systems are reviewed and negative.    PHYSICAL EXAM: VS:  BP 130/66 mmHg  Pulse 57  Ht 5\' 7"  (1.702 m)  Wt 162 lb 12.8 oz (73.846 kg)  BMI 25.49 kg/m2 , BMI Body mass index is 25.49 kg/(m^2). GEN: Well nourished, well developed, in no acute distress HEENT: normal Neck: no JVD, carotid bruits, or masses Cardiac: RRR; no murmurs, rubs, or gallops,no edema  Respiratory:  clear to auscultation bilaterally, normal work of  breathing GI: soft, nontender, nondistended, + BS MS: no deformity or atrophy Skin: warm and dry, no rash Neuro:  Strength and sensation are intact Psych: euthymic mood, full affect   EKG:  EKG is ordered today. The ekg ordered today demonstrates sinus bradycardia at 57 bpm.  Otherwise normal EKG.   Recent Labs: 01/10/2015: Platelets 233 01/18/2015: BUN 17; Creatinine, Ser 0.90; Hemoglobin 12.5*; Potassium 4.2; Sodium 138    Lipid Panel No results found for: CHOL, TRIG, HDL, CHOLHDL, VLDL, LDLCALC, LDLDIRECT    Wt Readings from Last 3 Encounters:  08/28/15 162 lb 12.8 oz (73.846 kg)  05/27/15 159 lb (72.122 kg)  01/17/15 159 lb (72.122 kg)        ASSESSMENT AND PLAN:  1. Paroxysmal atrial fibrillation.  This patients CHA2DS2-VASc Score is 2 for age. 2.past history of prostate cancer status post prostatectomy  Above score calculated as 1 point each if present [CHF, HTN, DM, Vascular=MI/PAD/Aortic Plaque, Age if 65-74, or Male] Above score calculated as 2 points each if present [Age > 75, or Stroke/TIA/TE]    Current medicines are reviewed at length with the patient today.  The patient does not have concerns regarding medicines.  The following changes have been made:  no change  Labs/ tests ordered today include:  Orders Placed This Encounter  Procedures  . EKG 12-Lead    Disposition: Continue current medication.  We gave him a refill prescription for Pradaxa.  He will follow-up with Dr. Percival Spanish in 3 months   Signed, Darlin Coco MD 08/28/2015 5:10 PM    [address], [place], [place]  [address] Phone: [phone]; Fax: [phone]

## 2015-10-23 DIAGNOSIS — R310 Gross hematuria: Secondary | ICD-10-CM | POA: Diagnosis not present

## 2015-10-23 DIAGNOSIS — C61 Malignant neoplasm of prostate: Secondary | ICD-10-CM | POA: Diagnosis not present

## 2015-10-23 DIAGNOSIS — Z0000 Encounter for general adult medical examination without abnormal findings: Secondary | ICD-10-CM | POA: Diagnosis not present

## 2015-10-27 DIAGNOSIS — R310 Gross hematuria: Secondary | ICD-10-CM | POA: Diagnosis not present

## 2015-10-27 DIAGNOSIS — R3129 Other microscopic hematuria: Secondary | ICD-10-CM | POA: Diagnosis not present

## 2015-11-09 NOTE — Progress Notes (Signed)
Cardiology Office Note   Date:  11/10/2015   ID:  Joseph [name], DOB 24-Aug-1935, MRN [id number]  PCP:  Horatio Pel, MD  Cardiologist:   [name], MD   Chief Complaint  Patient presents with  . Follow-up    sometimes at night hand numbness. some tiredness. no chest pains. no swelling of the hands, legs, ankles, or feet.      History of Present Illness: Joseph [name] is a 80 y.o. male who presents for follow up of atrial fib.  He had cardioversion in 2012 when he developed atrial fibrillation at the time of ruptured diverticulum. However, he had another recurrence of this last year that he says was associated with antibiotics. He converted on his own eventually as an outpatient. He doesn't really notice when he is in the fibrillation. He otherwise feels well. He is active and plays tennis. The patient denies any new symptoms such as chest discomfort, neck or arm discomfort. There has been no new shortness of breath, PND or orthopnea. There have been no reported palpitations, presyncope or syncope.   Past Medical History  Diagnosis Date  . Atrial fibrillation ([place])     resolved with cardioversion  . Sigmoid diverticulitis April of 2012    with small perforation  . Complication of anesthesia   . PONV (postoperative nausea and vomiting)   . Arthritis   . History of skin cancer   . Squamous cell carcinoma ([place])   . Prostate cancer ([place])   . Headache     occasional    Past Surgical History  Procedure Laterality Date  . Shoulder surgery      x 2  . Nasal septum surgery    . Lasik    . Cardioversion  01/2009  . Robot assisted laparoscopic radical prostatectomy N/A 01/17/2015    Procedure: ROBOTIC ASSISTED LAPAROSCOPIC RADICAL PROSTATECTOMY WITH INDOCYANINE GREEN DYE INJECTION;  Surgeon: Alexis Frock, MD;  [facility];  Service: Urology;  Laterality: N/A;  . Lymphadenectomy Bilateral 01/17/2015    Procedure: PELVIC LYMPHADENECTOMY;  Surgeon: Alexis Frock, MD;   [facility];  Service: Urology;  Laterality: Bilateral;     Current Outpatient Prescriptions  Medication Sig Dispense Refill  . acetaminophen (TYLENOL) 500 MG tablet Take 500 mg by mouth every 4 (four) hours as needed for moderate pain or headache.     . dabigatran (PRADAXA) 150 MG CAPS capsule Take 1 capsule (150 mg total) by mouth 2 (two) times daily. 60 capsule 5  . metoprolol tartrate (LOPRESSOR) 25 MG tablet Take 1 tablet (25 mg total) by mouth 2 (two) times daily. 180 tablet 3   No current facility-administered medications for this visit.    Allergies:   Review of patient's allergies indicates no known allergies.    ROS:  Please see the history of present illness.   Otherwise, review of systems are positive for Urinary incontinence, occasional hematuria.   All other systems are reviewed and negative.    PHYSICAL EXAM: VS:  BP 132/80 mmHg  Pulse 64  Ht 5\' 7"  (1.702 m)  Wt 164 lb (74.39 kg)  BMI 25.68 kg/m2 , BMI Body mass index is 25.68 kg/(m^2). GENERAL:  Well appearing HEENT:  Pupils equal round and reactive, fundi not visualized, oral mucosa unremarkable NECK:  No jugular venous distention, waveform within normal limits, carotid upstroke brisk and symmetric, no bruits, no thyromegaly LYMPHATICS:  No cervical, inguinal adenopathy LUNGS:  Clear to auscultation bilaterally BACK:  No CVA  tenderness CHEST:  Unremarkable HEART:  PMI not displaced or sustained,S1 and S2 within normal limits, no S3, no S4, no clicks, no rubs, no murmurs ABD:  Flat, positive bowel sounds normal in frequency in pitch, no bruits, no rebound, no guarding, no midline pulsatile mass, no hepatomegaly, no splenomegaly EXT:  2 plus pulses throughout, no edema, no cyanosis no clubbing SKIN:  No rashes no nodules NEURO:  Cranial nerves II through XII grossly intact, motor grossly intact throughout PSYCH:  Cognitively intact, oriented to person place and time    EKG:  EKG is not ordered  today.    Recent Labs: 01/10/2015: Platelets 233 01/18/2015: BUN 17; Creatinine, Ser 0.90; Hemoglobin 12.5*; Potassium 4.2; Sodium 138    Lipid Panel No results found for: CHOL, TRIG, HDL, CHOLHDL, VLDL, LDLCALC, LDLDIRECT    Wt Readings from Last 3 Encounters:  11/10/15 164 lb (74.39 kg)  08/28/15 162 lb 12.8 oz (73.846 kg)  05/27/15 159 lb (72.122 kg)      Other studies Reviewed: Additional studies/ records that were reviewed today include: Office records. Review of the above records demonstrates:  Please see elsewhere in the note.     ASSESSMENT AND PLAN:   ATRIAL FIB:   The patient has no symptomatic recurrence of this. We discussed this at length. No change in therapy is indicated. Mr. Joseph [name] has a CHA2DS2 - VASc score of 2 with a risk of stroke of 2%.  Given this is reasonable that he stay on the Pradaxa.  Current medicines are reviewed at length with the patient today.  The patient does not have concerns regarding medicines.  The following changes have been made:  no change  Labs/ tests ordered today include: None No orders of the defined types were placed in this encounter.     Disposition:   FU with six months.     Signed, [name], MD  11/10/2015 9:01 AM    [name]

## 2015-11-10 ENCOUNTER — Encounter: Payer: Self-pay | Admitting: Cardiology

## 2015-11-10 ENCOUNTER — Ambulatory Visit (INDEPENDENT_AMBULATORY_CARE_PROVIDER_SITE_OTHER): Payer: Medicare Other | Admitting: Cardiology

## 2015-11-10 VITALS — BP 132/80 | HR 64 | Ht 67.0 in | Wt 164.0 lb

## 2015-11-10 DIAGNOSIS — I480 Paroxysmal atrial fibrillation: Secondary | ICD-10-CM

## 2015-11-10 MED ORDER — DABIGATRAN ETEXILATE MESYLATE 150 MG PO CAPS: 150.0000 mg | ORAL_CAPSULE | Freq: Two times a day (BID) | ORAL | Status: DC

## 2015-11-10 NOTE — Patient Instructions (Signed)
Your physician wants you to follow-up in: 6 Months You will receive a reminder letter in the mail two months in advance. If you don't receive a letter, please call our office to schedule the follow-up appointment.  

## 2015-12-15 DIAGNOSIS — C61 Malignant neoplasm of prostate: Secondary | ICD-10-CM | POA: Diagnosis not present

## 2015-12-22 DIAGNOSIS — Z0000 Encounter for general adult medical examination without abnormal findings: Secondary | ICD-10-CM | POA: Diagnosis not present

## 2015-12-22 DIAGNOSIS — R310 Gross hematuria: Secondary | ICD-10-CM | POA: Diagnosis not present

## 2015-12-22 DIAGNOSIS — C61 Malignant neoplasm of prostate: Secondary | ICD-10-CM | POA: Diagnosis not present

## 2016-03-23 DIAGNOSIS — H6122 Impacted cerumen, left ear: Secondary | ICD-10-CM | POA: Diagnosis not present

## 2016-03-23 DIAGNOSIS — H60502 Unspecified acute noninfective otitis externa, left ear: Secondary | ICD-10-CM | POA: Diagnosis not present

## 2016-04-26 NOTE — Progress Notes (Signed)
Cardiology Office Note   Date:  04/27/2016   ID:  Joseph [name], DOB 1936-02-21, MRN [id number]  PCP:  Horatio Pel, MD  Cardiologist:   [name], MD   Chief Complaint  Patient presents with  . Hematuria      History of Present Illness: Joseph [name] is a 80 y.o. male who presents for follow up of atrial fib.  He had cardioversion in 2012 when he developed atrial fibrillation at the time of ruptured diverticulum. However, he had another recurrence of this last year that he says was associated with antibiotics. He converted on his own eventually as an outpatient. He doesn't really notice when he is in the fibrillation. He otherwise he feels Okay. He is having hematuria which has been worked up and no etiology can be identified. His urologist states correctly the Pradaxa probably contributing to this. His hematuria seems to happen after he's been exercising. He denies any symptoms with this. He's had a thorough evaluation. He hasn't noticed any palpitations, presyncope or syncope. He has no chest pressure, neck or arm discomfort.  Past Medical History:  Diagnosis Date  . Arthritis   . Atrial fibrillation ([place])    resolved with cardioversion  . Complication of anesthesia   . Headache    occasional  . History of skin cancer   . PONV (postoperative nausea and vomiting)   . Prostate cancer ([place])   . Sigmoid diverticulitis April of 2012   with small perforation  . Squamous cell carcinoma [facility])     Past Surgical History:  Procedure Laterality Date  . CARDIOVERSION  01/2009  . LASIK    . LYMPHADENECTOMY Bilateral 01/17/2015   Procedure: PELVIC LYMPHADENECTOMY;  Surgeon: Alexis Frock, MD;  [facility];  Service: Urology;  Laterality: Bilateral;  . NASAL SEPTUM SURGERY    . ROBOT ASSISTED LAPAROSCOPIC RADICAL PROSTATECTOMY N/A 01/17/2015   Procedure: ROBOTIC ASSISTED LAPAROSCOPIC RADICAL PROSTATECTOMY WITH INDOCYANINE GREEN DYE INJECTION;  Surgeon: Alexis Frock, MD;   [facility];  Service: Urology;  Laterality: N/A;  . SHOULDER SURGERY     x 2     Current Outpatient Prescriptions  Medication Sig Dispense Refill  . acetaminophen (TYLENOL) 500 MG tablet Take 500 mg by mouth every 4 (four) hours as needed for moderate pain or headache.     . dabigatran (PRADAXA) 150 MG CAPS capsule Take 1 capsule (150 mg total) by mouth 2 (two) times daily. 180 capsule 3  . DULoxetine (CYMBALTA) 20 MG capsule Take 20 mg by mouth daily.    . metoprolol tartrate (LOPRESSOR) 25 MG tablet Take 12.5 mg by mouth 2 (two) times daily.     No current facility-administered medications for this visit.     Allergies:   Review of patient's allergies indicates no known allergies.    ROS:  Please see the history of present illness.   Otherwise, review of systems are positive for urinary incontinence, occasional hematuria .   All other systems are reviewed and negative.    PHYSICAL EXAM: VS:  BP 120/72   Pulse 84   Ht 5\' 7"  (1.702 m)   Wt 164 lb (74.4 kg)   BMI 25.69 kg/m  , BMI Body mass index is 25.69 kg/m. GENERAL:  Well appearing NECK:  No jugular venous distention, waveform within normal limits, carotid upstroke brisk and symmetric, no bruits, no thyromegaly LUNGS:  Clear to auscultation bilaterally BACK:  No CVA tenderness CHEST:  Unremarkable HEART:  PMI not  displaced or sustained,S1 and S2 within normal limits, no S3, no S4, no clicks, no rubs, no murmurs ABD:  Flat, positive bowel sounds normal in frequency in pitch, no bruits, no rebound, no guarding, no midline pulsatile mass, no hepatomegaly, no splenomegaly EXT:  2 plus pulses throughout, no edema, no cyanosis no clubbing   EKG:  EKG is not ordered today.   Recent Labs: No results found for requested labs within last 8760 hours.    Lipid Panel No results found for: CHOL, TRIG, HDL, CHOLHDL, VLDL, LDLCALC, LDLDIRECT    Wt Readings from Last 3 Encounters:  04/27/16 164 lb (74.4 kg)  11/10/15 164  lb (74.4 kg)  08/28/15 162 lb 12.8 oz (73.8 kg)      Other studies Reviewed: Additional studies/ records that were reviewed today include:None. Review of the above records demonstrates:  ASSESSMENT AND PLAN:   ATRIAL FIB:   The patient has no symptomatic recurrence of this. We discussed this at length. No change in therapy is indicated. Joseph. JSEAN [name] has a CHA2DS2 - VASc score of 2 with a risk of stroke of 2%.  Given this is reasonable that he stay on the Pradaxa.   We had a long discussion about this today and the fact that he is having hematuria in part related to his medication. We'll check a CBC and abuse anemic and likely would suggest stopping the Pradaxa. Otherwise he is more concerned about 2% possibility of a stroke in the is about his hematuria and would likely choose to continue it. He had a long discussion that included Patient Directed Decision [place]  Current medicines are reviewed at length with the patient today.  The patient does not have concerns regarding medicines.  The following changes have been made:    Labs/ tests ordered today include:   Orders Placed This Encounter  Procedures  . CBC     Disposition:   FU with 6 months.     Signed, [name], MD  04/27/2016 2:39 PM    [place]

## 2016-04-27 ENCOUNTER — Encounter: Payer: Self-pay | Admitting: Cardiology

## 2016-04-27 ENCOUNTER — Ambulatory Visit (INDEPENDENT_AMBULATORY_CARE_PROVIDER_SITE_OTHER): Payer: Medicare Other | Admitting: Cardiology

## 2016-04-27 VITALS — BP 120/72 | HR 84 | Ht 67.0 in | Wt 164.0 lb

## 2016-04-27 DIAGNOSIS — Z79899 Other long term (current) drug therapy: Secondary | ICD-10-CM | POA: Diagnosis not present

## 2016-04-27 DIAGNOSIS — I480 Paroxysmal atrial fibrillation: Secondary | ICD-10-CM | POA: Diagnosis not present

## 2016-04-27 LAB — CBC
HEMATOCRIT: 41.6 % (ref 38.5–50.0)
Hemoglobin: 14 g/dL (ref 13.2–17.1)
MCH: 33.2 pg — ABNORMAL HIGH (ref 27.0–33.0)
MCHC: 33.7 g/dL (ref 32.0–36.0)
MCV: 98.6 fL (ref 80.0–100.0)
MPV: 9.2 fL (ref 7.5–12.5)
Platelets: 248 10*3/uL (ref 140–400)
RBC: 4.22 MIL/uL (ref 4.20–5.80)
RDW: 13.8 % (ref 11.0–15.0)
WBC: 6.9 10*3/uL (ref 3.8–10.8)

## 2016-04-27 NOTE — Patient Instructions (Signed)
Medication Instructions:  Continue current medications  Labwork: CBC  Testing/Procedures: None Ordered  Follow-Up: Your physician wants you to follow-up in: 6 Months. You will receive a reminder letter in the mail two months in advance. If you don't receive a letter, please call our office to schedule the follow-up appointment.   Any Other Special Instructions Will Be Listed Below (If Applicable).   If you need a refill on your cardiac medications before your next appointment, please call your pharmacy.   

## 2016-06-04 DIAGNOSIS — Z79899 Other long term (current) drug therapy: Secondary | ICD-10-CM | POA: Diagnosis not present

## 2016-06-04 DIAGNOSIS — Z125 Encounter for screening for malignant neoplasm of prostate: Secondary | ICD-10-CM | POA: Diagnosis not present

## 2016-06-04 DIAGNOSIS — Z0000 Encounter for general adult medical examination without abnormal findings: Secondary | ICD-10-CM | POA: Diagnosis not present

## 2016-06-04 DIAGNOSIS — Z7982 Long term (current) use of aspirin: Secondary | ICD-10-CM | POA: Diagnosis not present

## 2016-06-04 DIAGNOSIS — N289 Disorder of kidney and ureter, unspecified: Secondary | ICD-10-CM | POA: Diagnosis not present

## 2016-06-16 DIAGNOSIS — Z0000 Encounter for general adult medical examination without abnormal findings: Secondary | ICD-10-CM | POA: Diagnosis not present

## 2016-06-23 DIAGNOSIS — L570 Actinic keratosis: Secondary | ICD-10-CM | POA: Diagnosis not present

## 2016-06-23 DIAGNOSIS — L821 Other seborrheic keratosis: Secondary | ICD-10-CM | POA: Diagnosis not present

## 2016-06-23 DIAGNOSIS — D239 Other benign neoplasm of skin, unspecified: Secondary | ICD-10-CM | POA: Diagnosis not present

## 2016-06-24 DIAGNOSIS — R310 Gross hematuria: Secondary | ICD-10-CM | POA: Diagnosis not present

## 2016-06-24 DIAGNOSIS — C61 Malignant neoplasm of prostate: Secondary | ICD-10-CM | POA: Diagnosis not present

## 2016-08-10 ENCOUNTER — Other Ambulatory Visit: Payer: Self-pay | Admitting: Nurse Practitioner

## 2016-08-18 DIAGNOSIS — L570 Actinic keratosis: Secondary | ICD-10-CM | POA: Diagnosis not present

## 2016-09-01 DIAGNOSIS — R310 Gross hematuria: Secondary | ICD-10-CM | POA: Diagnosis not present

## 2016-09-01 DIAGNOSIS — Z8679 Personal history of other diseases of the circulatory system: Secondary | ICD-10-CM | POA: Diagnosis not present

## 2016-09-15 ENCOUNTER — Other Ambulatory Visit: Payer: Self-pay | Admitting: Physician Assistant

## 2016-09-15 DIAGNOSIS — D229 Melanocytic nevi, unspecified: Secondary | ICD-10-CM | POA: Diagnosis not present

## 2016-09-15 DIAGNOSIS — L570 Actinic keratosis: Secondary | ICD-10-CM | POA: Diagnosis not present

## 2016-09-15 DIAGNOSIS — C44519 Basal cell carcinoma of skin of other part of trunk: Secondary | ICD-10-CM | POA: Diagnosis not present

## 2016-09-15 DIAGNOSIS — L821 Other seborrheic keratosis: Secondary | ICD-10-CM | POA: Diagnosis not present

## 2016-09-15 DIAGNOSIS — L720 Epidermal cyst: Secondary | ICD-10-CM | POA: Diagnosis not present

## 2016-09-15 DIAGNOSIS — D492 Neoplasm of unspecified behavior of bone, soft tissue, and skin: Secondary | ICD-10-CM | POA: Diagnosis not present

## 2016-09-17 DIAGNOSIS — R310 Gross hematuria: Secondary | ICD-10-CM | POA: Diagnosis not present

## 2016-09-28 DIAGNOSIS — M25511 Pain in right shoulder: Secondary | ICD-10-CM | POA: Diagnosis not present

## 2016-10-04 DIAGNOSIS — M25511 Pain in right shoulder: Secondary | ICD-10-CM | POA: Diagnosis not present

## 2016-10-07 DIAGNOSIS — C44519 Basal cell carcinoma of skin of other part of trunk: Secondary | ICD-10-CM | POA: Diagnosis not present

## 2016-10-07 DIAGNOSIS — L570 Actinic keratosis: Secondary | ICD-10-CM | POA: Diagnosis not present

## 2016-10-08 ENCOUNTER — Encounter: Payer: Self-pay | Admitting: Cardiology

## 2016-10-11 DIAGNOSIS — H1840 Unspecified corneal degeneration: Secondary | ICD-10-CM | POA: Diagnosis not present

## 2016-10-11 DIAGNOSIS — H353131 Nonexudative age-related macular degeneration, bilateral, early dry stage: Secondary | ICD-10-CM | POA: Diagnosis not present

## 2016-10-11 DIAGNOSIS — H2513 Age-related nuclear cataract, bilateral: Secondary | ICD-10-CM | POA: Diagnosis not present

## 2016-10-11 DIAGNOSIS — H11001 Unspecified pterygium of right eye: Secondary | ICD-10-CM | POA: Diagnosis not present

## 2016-10-17 NOTE — Progress Notes (Signed)
Cardiology Office Note   Date:  10/18/2016   ID:  Joseph [name], DOB Jul 05, 1936, MRN [id number]  PCP:  Horatio Pel, MD  Cardiologist:   [name], MD   No chief complaint on file.     History of Present Illness: Joseph [name] is a 81 y.o. male who presents for follow up of atrial fib.  He had cardioversion in 2012 when he developed atrial fibrillation at the time of ruptured diverticulum. However, he had another recurrence of this last year that he says was associated with antibiotics. He converted on his own eventually as an outpatient. He doesn't really notice when he is in the fibrillation. Previous hematuria that he had had stopped.  He has noticed that his heart rate has been increased to about 100 recently so he started taking an increased dose of his beta blocker.  He is in atrial fib today and does not notice this.  The patient denies any new symptoms such as chest discomfort, neck or arm discomfort. There has been no new shortness of breath, PND or orthopnea. There have been no reported palpitations, presyncope or syncope.  He is active and even plays tennis.   Past Medical History:  Diagnosis Date  . Arthritis   . Atrial fibrillation ([place])    resolved with cardioversion  . Complication of anesthesia   . Headache    occasional  . History of skin cancer   . PONV (postoperative nausea and vomiting)   . Prostate cancer ([name])   . Sigmoid diverticulitis April of 2012   with small perforation  . Squamous cell carcinoma     Past Surgical History:  Procedure Laterality Date  . CARDIOVERSION  01/2009  . LASIK    . LYMPHADENECTOMY Bilateral 01/17/2015   Procedure: PELVIC LYMPHADENECTOMY;  Surgeon: Alexis Frock, MD;  [facility];  Service: Urology;  Laterality: Bilateral;  . NASAL SEPTUM SURGERY    . ROBOT ASSISTED LAPAROSCOPIC RADICAL PROSTATECTOMY N/A 01/17/2015   Procedure: ROBOTIC ASSISTED LAPAROSCOPIC RADICAL PROSTATECTOMY WITH INDOCYANINE GREEN DYE  INJECTION;  Surgeon: Alexis Frock, MD;  [facility];  Service: Urology;  Laterality: N/A;  . SHOULDER SURGERY     x 2     Current Outpatient Prescriptions  Medication Sig Dispense Refill  . acetaminophen (TYLENOL) 650 MG CR tablet Take 650 mg by mouth every 8 (eight) hours as needed for pain.    . dabigatran (PRADAXA) 150 MG CAPS capsule Take 1 capsule (150 mg total) by mouth 2 (two) times daily. 180 capsule 3  . DULoxetine (CYMBALTA) 20 MG capsule Take 20 mg by mouth daily.    . metoprolol tartrate (LOPRESSOR) 25 MG tablet Take 1 tablet (25 mg total) by mouth 2 (two) times daily. 180 tablet 3  . sildenafil (REVATIO) 20 MG tablet Take 20 mg by mouth as needed.     No current facility-administered medications for this visit.     Allergies:   Patient has no known allergies.    ROS:  Please see the history of present illness.   Otherwise, review of systems are positive for urinary incontinence.   All other systems are reviewed and negative.    PHYSICAL EXAM: VS:  BP 110/80 (BP Location: Right Arm, Patient Position: Sitting, Cuff Size: Normal)   Pulse 76   Ht 5\' 6"  (1.676 m)   Wt 162 lb (73.5 kg)   BMI 26.15 kg/m  , BMI Body mass index is 26.15 kg/m. GENERAL:  Well appearing  NECK:  No jugular venous distention, waveform within normal limits, carotid upstroke brisk and symmetric, no bruits, no thyromegaly LUNGS:  Clear to auscultation bilaterally BACK:  No CVA tenderness CHEST:  Unremarkable HEART:  PMI not displaced or sustained,S1 and S2 within normal limits, no S3, no S4, no clicks, no rubs, no murmurs ABD:  Flat, positive bowel sounds normal in frequency in pitch, no bruits, no rebound, no guarding, no midline pulsatile mass, no hepatomegaly, no splenomegaly EXT:  2 plus pulses throughout, no edema, no cyanosis no clubbing   EKG:  EKG is ordered today. Atrial fibrillation, rate 76, axis within normal limits, intervals within normal limits, no acute ST-T wave  changes.  Recent Labs: 04/27/2016: Hemoglobin 14.0; Platelets 248    Lipid Panel No results found for: CHOL, TRIG, HDL, CHOLHDL, VLDL, LDLCALC, LDLDIRECT    Wt Readings from Last 3 Encounters:  10/18/16 162 lb (73.5 kg)  04/27/16 164 lb (74.4 kg)  11/10/15 164 lb (74.4 kg)      Other studies Reviewed: Additional studies/ records that were reviewed today include: None. Review of the above records demonstrates:    ASSESSMENT AND PLAN:   ATRIAL FIB:      The patient is now in atrial fib an asymptomatic. There is vague incision close sr. Mr. Joseph [name] has a CHA2DS2 - VASc score of 2 with a risk of stroke of 2%.  Given this is reasonable that he stay on the Pradaxa.   We had a long discussion about this again today.  He will remain on the higher dose of beta blocker.    Current medicines are reviewed at length with the patient today.  The patient does not have concerns regarding medicines.  The following changes have been made:  None  Labs/ tests ordered today include:  None  Orders Placed This Encounter  Procedures  . EKG 12-Lead     Disposition:   FU with 12 months.     Signed, [name], MD  10/18/2016 5:50 PM    [facility]

## 2016-10-18 ENCOUNTER — Ambulatory Visit (INDEPENDENT_AMBULATORY_CARE_PROVIDER_SITE_OTHER): Payer: Medicare Other | Admitting: Cardiology

## 2016-10-18 ENCOUNTER — Encounter: Payer: Self-pay | Admitting: Cardiology

## 2016-10-18 VITALS — BP 110/80 | HR 76 | Ht 66.0 in | Wt 162.0 lb

## 2016-10-18 DIAGNOSIS — I480 Paroxysmal atrial fibrillation: Secondary | ICD-10-CM | POA: Diagnosis not present

## 2016-10-18 MED ORDER — METOPROLOL TARTRATE 25 MG PO TABS: 25.0000 mg | ORAL_TABLET | Freq: Two times a day (BID) | ORAL | 3 refills | Status: DC

## 2016-10-18 NOTE — Patient Instructions (Signed)

## 2016-10-20 ENCOUNTER — Ambulatory Visit: Payer: Medicare Other | Admitting: Cardiology

## 2016-11-09 ENCOUNTER — Other Ambulatory Visit: Payer: Self-pay | Admitting: Cardiology

## 2016-11-10 ENCOUNTER — Other Ambulatory Visit: Payer: Self-pay | Admitting: *Deleted

## 2016-11-10 MED ORDER — DABIGATRAN ETEXILATE MESYLATE 150 MG PO CAPS: 150.0000 mg | ORAL_CAPSULE | Freq: Two times a day (BID) | ORAL | 3 refills | Status: DC

## 2016-11-11 DIAGNOSIS — M25512 Pain in left shoulder: Secondary | ICD-10-CM | POA: Diagnosis not present

## 2017-02-21 DIAGNOSIS — C61 Malignant neoplasm of prostate: Secondary | ICD-10-CM | POA: Diagnosis not present

## 2017-02-21 DIAGNOSIS — R310 Gross hematuria: Secondary | ICD-10-CM | POA: Diagnosis not present

## 2017-03-04 DIAGNOSIS — M25511 Pain in right shoulder: Secondary | ICD-10-CM | POA: Diagnosis not present

## 2017-03-10 DIAGNOSIS — R310 Gross hematuria: Secondary | ICD-10-CM | POA: Diagnosis not present

## 2017-05-24 ENCOUNTER — Other Ambulatory Visit: Payer: Self-pay | Admitting: Physician Assistant

## 2017-05-24 DIAGNOSIS — B079 Viral wart, unspecified: Secondary | ICD-10-CM | POA: Diagnosis not present

## 2017-05-24 DIAGNOSIS — D492 Neoplasm of unspecified behavior of bone, soft tissue, and skin: Secondary | ICD-10-CM | POA: Diagnosis not present

## 2017-05-24 DIAGNOSIS — D229 Melanocytic nevi, unspecified: Secondary | ICD-10-CM | POA: Diagnosis not present

## 2017-05-24 DIAGNOSIS — L570 Actinic keratosis: Secondary | ICD-10-CM | POA: Diagnosis not present

## 2017-06-16 DIAGNOSIS — I519 Heart disease, unspecified: Secondary | ICD-10-CM | POA: Diagnosis not present

## 2017-06-16 DIAGNOSIS — Z0001 Encounter for general adult medical examination with abnormal findings: Secondary | ICD-10-CM | POA: Diagnosis not present

## 2017-06-16 DIAGNOSIS — Z79899 Other long term (current) drug therapy: Secondary | ICD-10-CM | POA: Diagnosis not present

## 2017-06-16 DIAGNOSIS — Z125 Encounter for screening for malignant neoplasm of prostate: Secondary | ICD-10-CM | POA: Diagnosis not present

## 2017-06-16 DIAGNOSIS — N289 Disorder of kidney and ureter, unspecified: Secondary | ICD-10-CM | POA: Diagnosis not present

## 2017-06-16 DIAGNOSIS — I119 Hypertensive heart disease without heart failure: Secondary | ICD-10-CM | POA: Diagnosis not present

## 2017-06-16 DIAGNOSIS — Z7982 Long term (current) use of aspirin: Secondary | ICD-10-CM | POA: Diagnosis not present

## 2017-06-16 DIAGNOSIS — Z0000 Encounter for general adult medical examination without abnormal findings: Secondary | ICD-10-CM | POA: Diagnosis not present

## 2017-06-21 DIAGNOSIS — Z0001 Encounter for general adult medical examination with abnormal findings: Secondary | ICD-10-CM | POA: Diagnosis not present

## 2017-06-21 DIAGNOSIS — H04123 Dry eye syndrome of bilateral lacrimal glands: Secondary | ICD-10-CM | POA: Diagnosis not present

## 2017-06-21 DIAGNOSIS — H2513 Age-related nuclear cataract, bilateral: Secondary | ICD-10-CM | POA: Diagnosis not present

## 2017-06-21 DIAGNOSIS — H11001 Unspecified pterygium of right eye: Secondary | ICD-10-CM | POA: Diagnosis not present

## 2017-06-21 DIAGNOSIS — H353131 Nonexudative age-related macular degeneration, bilateral, early dry stage: Secondary | ICD-10-CM | POA: Diagnosis not present

## 2017-06-21 DIAGNOSIS — M539 Dorsopathy, unspecified: Secondary | ICD-10-CM | POA: Diagnosis not present

## 2017-07-04 DIAGNOSIS — R310 Gross hematuria: Secondary | ICD-10-CM | POA: Diagnosis not present

## 2017-07-04 DIAGNOSIS — C61 Malignant neoplasm of prostate: Secondary | ICD-10-CM | POA: Diagnosis not present

## 2017-09-21 DIAGNOSIS — H2513 Age-related nuclear cataract, bilateral: Secondary | ICD-10-CM | POA: Diagnosis not present

## 2017-09-21 DIAGNOSIS — H04123 Dry eye syndrome of bilateral lacrimal glands: Secondary | ICD-10-CM | POA: Diagnosis not present

## 2017-09-21 DIAGNOSIS — H11001 Unspecified pterygium of right eye: Secondary | ICD-10-CM | POA: Diagnosis not present

## 2017-09-21 DIAGNOSIS — H353131 Nonexudative age-related macular degeneration, bilateral, early dry stage: Secondary | ICD-10-CM | POA: Diagnosis not present

## 2017-10-11 DIAGNOSIS — M25512 Pain in left shoulder: Secondary | ICD-10-CM | POA: Diagnosis not present

## 2017-10-11 DIAGNOSIS — M25511 Pain in right shoulder: Secondary | ICD-10-CM | POA: Diagnosis not present

## 2017-10-26 ENCOUNTER — Other Ambulatory Visit: Payer: Self-pay | Admitting: Cardiology

## 2018-03-12 NOTE — Progress Notes (Signed)
Cardiology Office Note   Date:  03/14/2018   ID:  Joseph [name], DOB January 25, 1936, MRN [id number]  PCP:  [name], MD  Cardiologist:   [name], MD   Chief Complaint  Patient presents with  . Atrial Fibrillation      History of Present Illness: [name] is a 82 y.o. male who presents for follow up of atrial fib.  He had cardioversion in 2012 when he developed atrial fibrillation at the time of ruptured diverticulum. However, he had another recurrence of this in 2017.   He was in atrial fib with no symptoms last year when I saw him.  Since then he has done well.  The patient denies any new symptoms such as chest discomfort, neck or arm discomfort. There has been no new shortness of breath, PND or orthopnea. There have been no reported palpitations, presyncope or syncope.  He plays tennis routinely.    Past Medical History:  Diagnosis Date  . Arthritis   . Atrial fibrillation ([place])    resolved with cardioversion  . Complication of anesthesia   . Headache    occasional  . History of skin cancer   . PONV (postoperative nausea and vomiting)   . Prostate cancer ([place])   . Sigmoid diverticulitis April of 2012   with small perforation  . Squamous cell carcinoma     Past Surgical History:  Procedure Laterality Date  . CARDIOVERSION  01/2009  . LASIK    . LYMPHADENECTOMY Bilateral 01/17/2015   Procedure: PELVIC LYMPHADENECTOMY;  Surgeon: Alexis Frock, MD;  Location: [facility];  Service: Urology;  Laterality: Bilateral;  . NASAL SEPTUM SURGERY    . ROBOT ASSISTED LAPAROSCOPIC RADICAL PROSTATECTOMY N/A 01/17/2015   Procedure: ROBOTIC ASSISTED LAPAROSCOPIC RADICAL PROSTATECTOMY WITH INDOCYANINE GREEN DYE INJECTION;  Surgeon: Alexis Frock, MD;  Location: [facility];  Service: Urology;  Laterality: N/A;  . SHOULDER SURGERY     x 2     Current Outpatient Medications  Medication Sig Dispense Refill  . acetaminophen (TYLENOL) 650 MG CR tablet Take 650 mg by mouth every 8 (eight)  hours as needed for pain.    . metoprolol tartrate (LOPRESSOR) 25 MG tablet take 1 tablet by mouth 2 times daily 180 tablet 2  . PRADAXA 150 MG CAPS capsule TAKE ONE CAPSULE BY MOUTH TWICE DAILY  180 capsule 2   No current facility-administered medications for this visit.     Allergies:   Patient has no known allergies.    ROS:  Please see the history of present illness.   Otherwise, review of systems are positive for none.   All other systems are reviewed and negative.    PHYSICAL EXAM: VS:  BP 122/70   Pulse (!) 54   Ht 5\' 7"  (1.702 m)   Wt 162 lb 3.2 oz (73.6 kg)   BMI 25.40 kg/m  , BMI Body mass index is 25.4 kg/m.  GENERAL:  Well appearing NECK:  No jugular venous distention, waveform within normal limits, carotid upstroke brisk and symmetric, no bruits, no thyromegaly LUNGS:  Clear to auscultation bilaterally CHEST:  Unremarkable HEART:  PMI not displaced or sustained,S1 and S2 within normal limits, no S3, no clicks, no rubs, no murmurs, irregular  ABD:  Flat, positive bowel sounds normal in frequency in pitch, no bruits, no rebound, no guarding, no midline pulsatile mass, no hepatomegaly, no splenomegaly EXT:  2 plus pulses throughout, no edema, no cyanosis no clubbing    EKG:  EKG is  ordered today.  NSR, rate 54 , axis within normal limits, intervals within normal limits, no acute ST-T wave changes.  03/14/2018  Recent Labs: No results found for requested labs within last 8760 hours.    Lipid Panel No results found for: CHOL, TRIG, HDL, CHOLHDL, VLDL, LDLCALC, LDLDIRECT    Wt Readings from Last 3 Encounters:  03/14/18 162 lb 3.2 oz (73.6 kg)  10/18/16 162 lb (73.5 kg)  04/27/16 164 lb (74.4 kg)      Other studies Reviewed: Additional studies/ records that were reviewed today include: None. Review of the above records demonstrates:    ASSESSMENT AND PLAN:   ATRIAL FIB:     He has chronic atrial fib.  . Mr. Joseph [name] has a CHA2DS2 - VASc score of 2 with  a risk of stroke of 2%.  He will continue meds as listed.  Current medicines are reviewed at length with the patient today.  The patient does not have concerns regarding medicines.  The following changes have been made:  None  Labs/ tests ordered today include:  None  No orders of the defined types were placed in this encounter.    Disposition:   FU with 12 months.     Signed, [name], MD  03/14/2018 2:05 PM    [facility]

## 2018-03-14 ENCOUNTER — Encounter: Payer: Self-pay | Admitting: Cardiology

## 2018-03-14 ENCOUNTER — Ambulatory Visit: Payer: Medicare Other | Admitting: Cardiology

## 2018-03-14 VITALS — BP 122/70 | HR 54 | Ht 67.0 in | Wt 162.2 lb

## 2018-03-14 DIAGNOSIS — I480 Paroxysmal atrial fibrillation: Secondary | ICD-10-CM

## 2018-03-14 NOTE — Patient Instructions (Signed)
Medication Instructions:  Your physician recommends that you continue on your current medications as directed. Please refer to the Current Medication list given to you today.  Labwork: none  Testing/Procedures: none  Follow-Up: Your physician wants you to follow-up in: 1 year  You will receive a reminder letter in the mail two months in advance. If you don't receive a letter, please call our office to schedule the follow-up appointment.  If you need a refill on your cardiac medications before your next appointment, please call your pharmacy.  

## 2018-04-07 ENCOUNTER — Other Ambulatory Visit: Payer: Self-pay | Admitting: Physician Assistant

## 2018-04-07 DIAGNOSIS — L570 Actinic keratosis: Secondary | ICD-10-CM | POA: Diagnosis not present

## 2018-04-07 DIAGNOSIS — D0439 Carcinoma in situ of skin of other parts of face: Secondary | ICD-10-CM | POA: Diagnosis not present

## 2018-05-02 DIAGNOSIS — H353131 Nonexudative age-related macular degeneration, bilateral, early dry stage: Secondary | ICD-10-CM | POA: Diagnosis not present

## 2018-05-02 DIAGNOSIS — H04123 Dry eye syndrome of bilateral lacrimal glands: Secondary | ICD-10-CM | POA: Diagnosis not present

## 2018-05-02 DIAGNOSIS — H11001 Unspecified pterygium of right eye: Secondary | ICD-10-CM | POA: Diagnosis not present

## 2018-05-02 DIAGNOSIS — H2513 Age-related nuclear cataract, bilateral: Secondary | ICD-10-CM | POA: Diagnosis not present

## 2018-05-23 DIAGNOSIS — M542 Cervicalgia: Secondary | ICD-10-CM | POA: Diagnosis not present

## 2018-05-23 DIAGNOSIS — M25511 Pain in right shoulder: Secondary | ICD-10-CM | POA: Diagnosis not present

## 2018-05-30 DIAGNOSIS — M25511 Pain in right shoulder: Secondary | ICD-10-CM | POA: Diagnosis not present

## 2018-06-20 DIAGNOSIS — I482 Chronic atrial fibrillation, unspecified: Secondary | ICD-10-CM | POA: Diagnosis not present

## 2018-06-20 DIAGNOSIS — N289 Disorder of kidney and ureter, unspecified: Secondary | ICD-10-CM | POA: Diagnosis not present

## 2018-06-20 DIAGNOSIS — Z7982 Long term (current) use of aspirin: Secondary | ICD-10-CM | POA: Diagnosis not present

## 2018-06-20 DIAGNOSIS — Z0000 Encounter for general adult medical examination without abnormal findings: Secondary | ICD-10-CM | POA: Diagnosis not present

## 2018-06-20 DIAGNOSIS — Z7901 Long term (current) use of anticoagulants: Secondary | ICD-10-CM | POA: Diagnosis not present

## 2018-06-20 DIAGNOSIS — R7303 Prediabetes: Secondary | ICD-10-CM | POA: Diagnosis not present

## 2018-06-20 DIAGNOSIS — I4891 Unspecified atrial fibrillation: Secondary | ICD-10-CM | POA: Diagnosis not present

## 2018-07-04 DIAGNOSIS — I4891 Unspecified atrial fibrillation: Secondary | ICD-10-CM | POA: Diagnosis not present

## 2018-07-04 DIAGNOSIS — Z0000 Encounter for general adult medical examination without abnormal findings: Secondary | ICD-10-CM | POA: Diagnosis not present

## 2018-07-04 DIAGNOSIS — Z7901 Long term (current) use of anticoagulants: Secondary | ICD-10-CM | POA: Diagnosis not present

## 2018-07-10 DIAGNOSIS — M150 Primary generalized (osteo)arthritis: Secondary | ICD-10-CM | POA: Diagnosis not present

## 2018-07-10 DIAGNOSIS — M545 Low back pain: Secondary | ICD-10-CM | POA: Diagnosis not present

## 2018-07-14 DIAGNOSIS — M545 Low back pain: Secondary | ICD-10-CM | POA: Diagnosis not present

## 2018-07-14 DIAGNOSIS — M150 Primary generalized (osteo)arthritis: Secondary | ICD-10-CM | POA: Diagnosis not present

## 2018-07-17 DIAGNOSIS — M150 Primary generalized (osteo)arthritis: Secondary | ICD-10-CM | POA: Diagnosis not present

## 2018-07-17 DIAGNOSIS — M545 Low back pain: Secondary | ICD-10-CM | POA: Diagnosis not present

## 2018-07-21 ENCOUNTER — Other Ambulatory Visit: Payer: Self-pay | Admitting: Cardiology

## 2018-07-21 DIAGNOSIS — M150 Primary generalized (osteo)arthritis: Secondary | ICD-10-CM | POA: Diagnosis not present

## 2018-07-21 DIAGNOSIS — M545 Low back pain: Secondary | ICD-10-CM | POA: Diagnosis not present

## 2018-07-26 ENCOUNTER — Other Ambulatory Visit: Payer: Self-pay | Admitting: Physician Assistant

## 2018-07-26 DIAGNOSIS — D485 Neoplasm of uncertain behavior of skin: Secondary | ICD-10-CM | POA: Diagnosis not present

## 2018-07-26 DIAGNOSIS — L570 Actinic keratosis: Secondary | ICD-10-CM | POA: Diagnosis not present

## 2018-07-26 DIAGNOSIS — D225 Melanocytic nevi of trunk: Secondary | ICD-10-CM | POA: Diagnosis not present

## 2018-08-29 DIAGNOSIS — M62838 Other muscle spasm: Secondary | ICD-10-CM | POA: Diagnosis not present

## 2018-08-29 DIAGNOSIS — M6281 Muscle weakness (generalized): Secondary | ICD-10-CM | POA: Diagnosis not present

## 2018-08-29 DIAGNOSIS — M6200 Separation of muscle (nontraumatic), unspecified site: Secondary | ICD-10-CM | POA: Diagnosis not present

## 2018-09-07 DIAGNOSIS — M62838 Other muscle spasm: Secondary | ICD-10-CM | POA: Diagnosis not present

## 2018-09-07 DIAGNOSIS — M6281 Muscle weakness (generalized): Secondary | ICD-10-CM | POA: Diagnosis not present

## 2018-09-07 DIAGNOSIS — M6200 Separation of muscle (nontraumatic), unspecified site: Secondary | ICD-10-CM | POA: Diagnosis not present

## 2018-09-21 DIAGNOSIS — M62838 Other muscle spasm: Secondary | ICD-10-CM | POA: Diagnosis not present

## 2018-09-21 DIAGNOSIS — M6281 Muscle weakness (generalized): Secondary | ICD-10-CM | POA: Diagnosis not present

## 2018-10-04 DIAGNOSIS — M6281 Muscle weakness (generalized): Secondary | ICD-10-CM | POA: Diagnosis not present

## 2018-10-04 DIAGNOSIS — M62838 Other muscle spasm: Secondary | ICD-10-CM | POA: Diagnosis not present

## 2018-10-17 DIAGNOSIS — M62838 Other muscle spasm: Secondary | ICD-10-CM | POA: Diagnosis not present

## 2018-10-17 DIAGNOSIS — M6281 Muscle weakness (generalized): Secondary | ICD-10-CM | POA: Diagnosis not present

## 2018-10-17 DIAGNOSIS — M6200 Separation of muscle (nontraumatic), unspecified site: Secondary | ICD-10-CM | POA: Diagnosis not present

## 2019-02-01 DIAGNOSIS — H0015 Chalazion left lower eyelid: Secondary | ICD-10-CM | POA: Diagnosis not present

## 2019-02-01 DIAGNOSIS — H0288A Meibomian gland dysfunction right eye, upper and lower eyelids: Secondary | ICD-10-CM | POA: Diagnosis not present

## 2019-02-01 DIAGNOSIS — H2513 Age-related nuclear cataract, bilateral: Secondary | ICD-10-CM | POA: Diagnosis not present

## 2019-02-01 DIAGNOSIS — H0102A Squamous blepharitis right eye, upper and lower eyelids: Secondary | ICD-10-CM | POA: Diagnosis not present

## 2019-02-01 DIAGNOSIS — H0102B Squamous blepharitis left eye, upper and lower eyelids: Secondary | ICD-10-CM | POA: Diagnosis not present

## 2019-02-15 DIAGNOSIS — H0102A Squamous blepharitis right eye, upper and lower eyelids: Secondary | ICD-10-CM | POA: Diagnosis not present

## 2019-02-15 DIAGNOSIS — H0102B Squamous blepharitis left eye, upper and lower eyelids: Secondary | ICD-10-CM | POA: Diagnosis not present

## 2019-02-15 DIAGNOSIS — H04123 Dry eye syndrome of bilateral lacrimal glands: Secondary | ICD-10-CM | POA: Diagnosis not present

## 2019-02-15 DIAGNOSIS — H11001 Unspecified pterygium of right eye: Secondary | ICD-10-CM | POA: Diagnosis not present

## 2019-02-15 DIAGNOSIS — H0015 Chalazion left lower eyelid: Secondary | ICD-10-CM | POA: Diagnosis not present

## 2019-04-06 DIAGNOSIS — Z23 Encounter for immunization: Secondary | ICD-10-CM | POA: Diagnosis not present

## 2019-04-25 NOTE — Progress Notes (Signed)
Cardiology Office Note   Date:  04/26/2019   ID:  Joseph [name], DOB Nov 17, 1935, MRN [id number]  PCP:  [name], MD  Cardiologist:   [name], MD   Chief Complaint  Patient presents with  . Atrial Fibrillation      History of Present Illness: Joseph [name] is a 83 y.o. male who presents for follow up of atrial fib.  He had cardioversion in 2012 when he developed atrial fibrillation at the time of ruptured diverticulum. However, he had another recurrence of this in 2017.     He has been in atrial fib since then.  He does not notice this.  The patient denies any new symptoms such as chest discomfort, neck or arm discomfort. There has been no new shortness of breath, PND or orthopnea. There have been no reported palpitations, presyncope or syncope.   Past Medical History:  Diagnosis Date  . Arthritis   . Atrial fibrillation ([place])    resolved with cardioversion  . Complication of anesthesia   . Headache    occasional  . History of skin cancer   . PONV (postoperative nausea and vomiting)   . Prostate cancer ([place])   . Sigmoid diverticulitis April of 2012   with small perforation  . Squamous cell carcinoma     Past Surgical History:  Procedure Laterality Date  . CARDIOVERSION  01/2009  . LASIK    . LYMPHADENECTOMY Bilateral 01/17/2015   Procedure: PELVIC LYMPHADENECTOMY;  Surgeon: Alexis Frock, MD;  Location: [facility];  Service: Urology;  Laterality: Bilateral;  . NASAL SEPTUM SURGERY    . ROBOT ASSISTED LAPAROSCOPIC RADICAL PROSTATECTOMY N/A 01/17/2015   Procedure: ROBOTIC ASSISTED LAPAROSCOPIC RADICAL PROSTATECTOMY WITH INDOCYANINE GREEN DYE INJECTION;  Surgeon: Alexis Frock, MD;  Location: [facility];  Service: Urology;  Laterality: N/A;  . SHOULDER SURGERY     x 2     Current Outpatient Medications  Medication Sig Dispense Refill  . acetaminophen (TYLENOL) 650 MG CR tablet Take 650 mg by mouth every 8 (eight) hours as needed for pain.    . dabigatran (PRADAXA)  150 MG CAPS capsule Take 1 capsule (150 mg total) by mouth 2 (two) times daily. 180 capsule 3  . metoprolol tartrate (LOPRESSOR) 25 MG tablet TAKE ONE TABLET BY MOUTH TWICE DAILY  180 tablet 3   No current facility-administered medications for this visit.     Allergies:   Patient has no known allergies.    ROS:  Please see the history of present illness.   Otherwise, review of systems are positive for none.   All other systems are reviewed and negative.    PHYSICAL EXAM: VS:  BP 124/84   Pulse 70   Ht 5\' 7"  (1.702 m)   Wt 165 lb (74.8 kg)   BMI 25.84 kg/m  , BMI Body mass index is 25.84 kg/m.  GENERAL:  Well appearing NECK:  No jugular venous distention, waveform within normal limits, carotid upstroke brisk and symmetric, no bruits, no thyromegaly LUNGS:  Clear to auscultation bilaterally CHEST:  Unremarkable HEART:  PMI not displaced or sustained,S1 and S2 within normal limits, no S3, no clicks, no rubs, no murmurs, irregular ABD:  Flat, positive bowel sounds normal in frequency in pitch, no bruits, no rebound, no guarding, no midline pulsatile mass, no hepatomegaly, no splenomegaly EXT:  2 plus pulses throughout, no edema, no cyanosis no clubbing   EKG:  EKG is  ordered today.  NSR, rate 70, axis  within normal limits, intervals within normal limits, no acute ST-T wave changes.  04/26/2019  Recent Labs: No results found for requested labs within last 8760 hours.    Lipid Panel No results found for: CHOL, TRIG, HDL, CHOLHDL, VLDL, LDLCALC, LDLDIRECT    Wt Readings from Last 3 Encounters:  04/26/19 165 lb (74.8 kg)  03/14/18 162 lb 3.2 oz (73.6 kg)  10/18/16 162 lb (73.5 kg)      Other studies Reviewed: Additional studies/ records that were reviewed today include: None Review of the above records demonstrates:  None  ASSESSMENT AND PLAN:   ATRIAL FIB:     He has chronic atrial fib.  . Joseph [name] has a CHA2DS2 - VASc score of 2 with a risk of stroke of 2%.  He  never feels this.   He has follow up labs scheduled with his primary MD in Nov.   Current medicines are reviewed at length with the patient today.  The patient does not have concerns regarding medicines.  The following changes have been made:  None  Labs/ tests ordered today include:  None  Orders Placed This Encounter  Procedures  . EKG 12-Lead     Disposition:   FU with 12 months.     Signed, [name], MD  04/26/2019 1:59 PM    [facility]

## 2019-04-26 ENCOUNTER — Ambulatory Visit (INDEPENDENT_AMBULATORY_CARE_PROVIDER_SITE_OTHER): Payer: Medicare Other | Admitting: Cardiology

## 2019-04-26 ENCOUNTER — Other Ambulatory Visit: Payer: Self-pay

## 2019-04-26 ENCOUNTER — Encounter: Payer: Self-pay | Admitting: Cardiology

## 2019-04-26 VITALS — BP 124/84 | HR 70 | Ht 67.0 in | Wt 165.0 lb

## 2019-04-26 DIAGNOSIS — I4891 Unspecified atrial fibrillation: Secondary | ICD-10-CM | POA: Diagnosis not present

## 2019-04-26 NOTE — Patient Instructions (Signed)
Medication Instructions:  Your physician recommends that you continue on your current medications as directed. Please refer to the Current Medication list given to you today.  If you need a refill on your cardiac medications before your next appointment, please call your pharmacy.   Lab work: NONE  Testing/Procedures: NONE  Follow-Up: At [facility], you and your health needs are our priority.  As part of our continuing mission to provide you with exceptional heart care, we have created designated Provider Care Teams.  These Care Teams include your primary Cardiologist (physician) and Advanced Practice Providers (APPs -  Physician Assistants and Nurse Practitioners) who all work together to provide you with the care you need, when you need it. You will need a follow up appointment in 12 months.  Please call our office 2 months in advance to schedule this appointment.  You may see Dr. Hochrein or one of the following Advanced Practice Providers on your designated Care Team:   Rhonda Barrett, PA-C Kathryn Lawrence, DNP, ANP      

## 2019-05-07 DIAGNOSIS — H0102B Squamous blepharitis left eye, upper and lower eyelids: Secondary | ICD-10-CM | POA: Diagnosis not present

## 2019-05-07 DIAGNOSIS — H0102A Squamous blepharitis right eye, upper and lower eyelids: Secondary | ICD-10-CM | POA: Diagnosis not present

## 2019-05-07 DIAGNOSIS — H2513 Age-related nuclear cataract, bilateral: Secondary | ICD-10-CM | POA: Diagnosis not present

## 2019-05-07 DIAGNOSIS — H0015 Chalazion left lower eyelid: Secondary | ICD-10-CM | POA: Diagnosis not present

## 2019-05-07 DIAGNOSIS — H11041 Peripheral pterygium, stationary, right eye: Secondary | ICD-10-CM | POA: Diagnosis not present

## 2019-05-10 DIAGNOSIS — M542 Cervicalgia: Secondary | ICD-10-CM | POA: Diagnosis not present

## 2019-05-10 DIAGNOSIS — M19011 Primary osteoarthritis, right shoulder: Secondary | ICD-10-CM | POA: Diagnosis not present

## 2019-06-13 DIAGNOSIS — L570 Actinic keratosis: Secondary | ICD-10-CM | POA: Diagnosis not present

## 2019-06-13 DIAGNOSIS — L304 Erythema intertrigo: Secondary | ICD-10-CM | POA: Diagnosis not present

## 2019-06-13 DIAGNOSIS — D229 Melanocytic nevi, unspecified: Secondary | ICD-10-CM | POA: Diagnosis not present

## 2019-07-02 DIAGNOSIS — Z7982 Long term (current) use of aspirin: Secondary | ICD-10-CM | POA: Diagnosis not present

## 2019-07-02 DIAGNOSIS — I10 Essential (primary) hypertension: Secondary | ICD-10-CM | POA: Diagnosis not present

## 2019-07-09 DIAGNOSIS — I482 Chronic atrial fibrillation, unspecified: Secondary | ICD-10-CM | POA: Diagnosis not present

## 2019-07-09 DIAGNOSIS — I10 Essential (primary) hypertension: Secondary | ICD-10-CM | POA: Diagnosis not present

## 2019-07-09 DIAGNOSIS — Z0000 Encounter for general adult medical examination without abnormal findings: Secondary | ICD-10-CM | POA: Diagnosis not present

## 2019-07-09 DIAGNOSIS — I4891 Unspecified atrial fibrillation: Secondary | ICD-10-CM | POA: Diagnosis not present

## 2019-07-11 ENCOUNTER — Other Ambulatory Visit: Payer: Self-pay | Admitting: Cardiology

## 2019-07-18 ENCOUNTER — Other Ambulatory Visit: Payer: Self-pay | Admitting: Cardiology

## 2019-08-26 ENCOUNTER — Ambulatory Visit: Payer: Medicare Other | Attending: Internal Medicine

## 2019-08-26 DIAGNOSIS — Z23 Encounter for immunization: Secondary | ICD-10-CM | POA: Insufficient documentation

## 2019-08-26 NOTE — Progress Notes (Unsigned)
   Covid-19 Vaccination Clinic  Name:  Joseph [name]    MRN: [id number] DOB: May 26, 1936  08/26/2019  [name] was observed post Covid-19 immunization for {COVID Vaccine Observation Times:23551} without incidence. He was provided with Vaccine Information Sheet and instruction to access the V-Safe system.   [name] was instructed to call 911 with any severe reactions post vaccine: Marland Kitchen Difficulty breathing  . Swelling of your face and throat  . A fast heartbeat  . A bad rash all over your body  . Dizziness and weakness

## 2019-09-15 ENCOUNTER — Ambulatory Visit: Payer: Medicare Other | Attending: Internal Medicine

## 2019-09-15 DIAGNOSIS — Z23 Encounter for immunization: Secondary | ICD-10-CM | POA: Insufficient documentation

## 2019-09-15 NOTE — Progress Notes (Signed)
   Covid-19 Vaccination Clinic  Name:  Joseph [name]    MRN: [id number] DOB: [birth year]  09/15/2019  Mr. Mcrobie was observed post Covid-19 immunization for 15 minutes without incidence. He was provided with Vaccine Information Sheet and instruction to access the V-Safe system.   Mr. Serratore was instructed to call 911 with any severe reactions post vaccine: Marland Kitchen Difficulty breathing  . Swelling of your face and throat  . A fast heartbeat  . A bad rash all over your body  . Dizziness and weakness    Immunizations Administered    Name Date Dose VIS Date Route   Pfizer COVID-19 Vaccine 09/15/2019 11:25 AM 0.3 mL 07/20/2019 Intramuscular   Manufacturer: [place]   Lot: [id number]   [place]: [id number]

## 2019-10-23 ENCOUNTER — Other Ambulatory Visit: Payer: Self-pay | Admitting: Cardiology

## 2020-03-14 ENCOUNTER — Telehealth: Payer: Self-pay | Admitting: Cardiology

## 2020-03-14 NOTE — Telephone Encounter (Signed)
LVM for patient to return call to get follow up scheduled with Hochrein from recall list 

## 2020-05-08 DIAGNOSIS — I482 Chronic atrial fibrillation, unspecified: Secondary | ICD-10-CM | POA: Insufficient documentation

## 2020-05-08 DIAGNOSIS — Z7189 Other specified counseling: Secondary | ICD-10-CM | POA: Insufficient documentation

## 2020-05-08 NOTE — Progress Notes (Signed)
Cardiology Office Note   Date:  05/09/2020   ID:  Joseph [name], DOB 1936/04/30, MRN [id number]  PCP:  [name], MD  Cardiologist:   [name], MD   Chief Complaint  Patient presents with  . Atrial Fibrillation      History of Present Illness: Joseph [name] is a 84 y.o. male who presents for follow up of atrial fib.  He had cardioversion in 2012 when he developed atrial fibrillation at the time of ruptured diverticulum. However, he had another recurrence of this in 2017.    He has been in chronic atrial fib.    Since I last saw him he is not having any new symptoms.  He does not know he is in fibrillation.  He does chair aerobics and exercises at [employer].  The patient denies any new symptoms such as chest discomfort, neck or arm discomfort. There has been no new shortness of breath, PND or orthopnea. There have been no reported palpitations, presyncope or syncope.    Past Medical History:  Diagnosis Date  . Arthritis   . Atrial fibrillation ([place])    resolved with cardioversion  . Complication of anesthesia   . Headache    occasional  . History of skin cancer   . PONV (postoperative nausea and vomiting)   . Prostate cancer ([place])   . Sigmoid diverticulitis April of 2012   with small perforation  . Squamous cell carcinoma     Past Surgical History:  Procedure Laterality Date  . CARDIOVERSION  01/2009  . LASIK    . LYMPHADENECTOMY Bilateral 01/17/2015   Procedure: PELVIC LYMPHADENECTOMY;  Surgeon: Alexis Frock, MD;  Location: [facility];  Service: Urology;  Laterality: Bilateral;  . NASAL SEPTUM SURGERY    . ROBOT ASSISTED LAPAROSCOPIC RADICAL PROSTATECTOMY N/A 01/17/2015   Procedure: ROBOTIC ASSISTED LAPAROSCOPIC RADICAL PROSTATECTOMY WITH INDOCYANINE GREEN DYE INJECTION;  Surgeon: Alexis Frock, MD;  Location: [facility];  Service: Urology;  Laterality: N/A;  . SHOULDER SURGERY     x 2     Current Outpatient Medications  Medication Sig Dispense Refill  .  acetaminophen (TYLENOL) 650 MG CR tablet Take 650 mg by mouth every 8 (eight) hours as needed for pain.    . dabigatran (PRADAXA) 150 MG CAPS capsule Take 1 capsule (150 mg total) by mouth 2 (two) times daily. 180 capsule 3  . doxycycline (ADOXA) 50 MG tablet Take 50 mg by mouth 2 (two) times daily.    . metoprolol tartrate (LOPRESSOR) 25 MG tablet Take 1 tablet (25 mg total) by mouth 2 (two) times daily. (BETA BLOCKER) 180 tablet 3  . omeprazole (PRILOSEC) 20 MG capsule Take 20 mg by mouth as directed.     No current facility-administered medications for this visit.    Allergies:   Patient has no known allergies.    ROS:  Please see the history of present illness.   Otherwise, review of systems are positive for none.   All other systems are reviewed and negative.    PHYSICAL EXAM: VS:  BP 128/62   Pulse 77   Ht 5\' 6"  (1.676 m)   Wt 164 lb 6.4 oz (74.6 kg)   BMI 26.53 kg/m  , BMI Body mass index is 26.53 kg/m.  GENERAL:  Well appearing NECK:  No jugular venous distention, waveform within normal limits, carotid upstroke brisk and symmetric, no bruits, no thyromegaly LUNGS:  Clear to auscultation bilaterally CHEST:  Unremarkable HEART:  PMI not displaced  or sustained,S1 and S2 within normal limits, no S3, no no clicks, no rubs, no murmurs, irregular ABD:  Flat, positive bowel sounds normal in frequency in pitch, no bruits, no rebound, no guarding, no midline pulsatile mass, no hepatomegaly, no splenomegaly EXT:  2 plus pulses throughout, no edema, no cyanosis no clubbing  EKG:  EKG is  ordered today.  NSR, rate 77, axis within normal limits, intervals within normal limits, no acute ST-T wave changes.  05/09/2020  Recent Labs: No results found for requested labs within last 8760 hours.    Lipid Panel No results found for: CHOL, TRIG, HDL, CHOLHDL, VLDL, LDLCALC, LDLDIRECT    Wt Readings from Last 3 Encounters:  05/09/20 164 lb 6.4 oz (74.6 kg)  04/26/19 165 lb (74.8 kg)    03/14/18 162 lb 3.2 oz (73.6 kg)      Other studies Reviewed: Additional studies/ records that were reviewed today include: None Review of the above records demonstrates:  None  ASSESSMENT AND PLAN:   ATRIAL FIB:     He tolerates anticoagulation. . Joseph [name] has a CHA2DS2 - VASc score of 2 with a risk of stroke of 2%.   We again had a long discussion about the lack of significant benefit of trying to convert him again to sinus rhythm as he has absolutely no symptoms associated with this and tolerates the blood thinners.    COVID EDUCATION: He has been vaccinated.  Current medicines are reviewed at length with the patient today.  The patient does not have concerns regarding medicines.  The following changes have been made:  None  Labs/ tests ordered today include:  None  Orders Placed This Encounter  Procedures  . EKG 12-Lead     Disposition:   FU with 12  months.     Signed, [name], MD  05/09/2020 1:29 PM    [facility]

## 2020-05-09 ENCOUNTER — Other Ambulatory Visit: Payer: Self-pay

## 2020-05-09 ENCOUNTER — Encounter: Payer: Self-pay | Admitting: Cardiology

## 2020-05-09 ENCOUNTER — Ambulatory Visit (INDEPENDENT_AMBULATORY_CARE_PROVIDER_SITE_OTHER): Payer: Medicare Other | Admitting: Cardiology

## 2020-05-09 VITALS — BP 128/62 | HR 77 | Ht 66.0 in | Wt 164.4 lb

## 2020-05-09 DIAGNOSIS — I482 Chronic atrial fibrillation, unspecified: Secondary | ICD-10-CM | POA: Diagnosis not present

## 2020-05-09 DIAGNOSIS — Z7189 Other specified counseling: Secondary | ICD-10-CM | POA: Diagnosis not present

## 2020-05-09 NOTE — Patient Instructions (Signed)
Medication Instructions:   No changes  *If you need a refill on your cardiac medications before your next appointment, please call your pharmacy*   Lab Work: Not needed   Testing/Procedures:  Not needed  Follow-Up: At [facility], you and your health needs are our priority.  As part of our continuing mission to provide you with exceptional heart care, we have created designated Provider Care Teams.  These Care Teams include your primary Cardiologist (physician) and Advanced Practice Providers (APPs -  Physician Assistants and Nurse Practitioners) who all work together to provide you with the care you need, when you need it.     Your next appointment:   12 month(s)  The format for your next appointment:   In Person  Provider:   James Hochrein, MD   Other Instructions  

## 2020-09-30 ENCOUNTER — Encounter: Payer: Self-pay | Admitting: Physician Assistant

## 2020-09-30 ENCOUNTER — Other Ambulatory Visit: Payer: Self-pay

## 2020-09-30 ENCOUNTER — Ambulatory Visit: Payer: Medicare Other | Admitting: Physician Assistant

## 2020-09-30 DIAGNOSIS — L570 Actinic keratosis: Secondary | ICD-10-CM

## 2020-09-30 DIAGNOSIS — Z1283 Encounter for screening for malignant neoplasm of skin: Secondary | ICD-10-CM

## 2020-09-30 DIAGNOSIS — L239 Allergic contact dermatitis, unspecified cause: Secondary | ICD-10-CM

## 2020-09-30 DIAGNOSIS — L817 Pigmented purpuric dermatosis: Secondary | ICD-10-CM | POA: Diagnosis not present

## 2020-09-30 MED ORDER — TRIAMCINOLONE ACETONIDE 0.1 % EX CREA: TOPICAL_CREAM | CUTANEOUS | 3 refills | Status: DC

## 2020-09-30 MED ORDER — TOLAK 4 % EX CREA: TOPICAL_CREAM | CUTANEOUS | 1 refills | Status: DC

## 2020-10-27 ENCOUNTER — Encounter: Payer: Self-pay | Admitting: Physician Assistant

## 2020-10-27 NOTE — Progress Notes (Signed)
   Follow-Up Visit   Subjective  Joseph [name] is a 85 y.o. male who presents for the following: Annual Exam (Patient here today for yearly skin check. Per patient he has a few crusty spots on his face no bleeding. Per patient he now has a ).   The following portions of the chart were reviewed this encounter and updated as appropriate:  Tobacco  Allergies  Meds  Problems  Med Hx  Surg Hx  Fam Hx      Objective  Well appearing patient in no apparent distress; mood and affect are within normal limits.  A full examination was performed including scalp, head, eyes, ears, nose, lips, neck, chest, axillae, abdomen, back, buttocks, bilateral upper extremities, bilateral lower extremities, hands, feet, fingers, toes, fingernails, and toenails. All findings within normal limits unless otherwise noted below.  Objective  head to toe: Full body skin examination-  Objective  Right Upper Back: Thin scaly erythematous papules coalescing to plaques.   Objective  Dorsum of Nose, Left Buccal Cheek, Left Dorsal Hand (3), Left Forearm - Anterior, Left Frontal Scalp (2), Left Malar Cheek, Left Nasal Sidewall, Left Parotid Area (2), Left Shoulder - Anterior, Left Temporal Scalp, Left Upper Arm - Anterior, Left Zygomatic Area (2), Neck - Anterior, Right Breast, Right Buccal Cheek, Right Forearm - Anterior (2), Right Forehead (2), Right Parotid Area, Right Proximal Thumb, Right Temple, Right Temporal Scalp: Erythematous patches with gritty scale.   Assessment & Plan  Encounter for screening for malignant neoplasm of skin head to toe  Yearly skin check  Allergic contact dermatitis, unspecified trigger Right Upper Back  Ordered Medications: triamcinolone (KENALOG) 0.1 %  Schamberg's capillaritis Right Ankle - Anterior  observe  AK (actinic keratosis) (28) Left Shoulder - Anterior; Neck - Anterior; Left Upper Arm - Anterior; Left Forearm - Anterior; Right Forearm - Anterior (2); Right Breast;  Left Dorsal Hand (3); Right Proximal Thumb; Left Frontal Scalp (2); Right Temple; Left Nasal Sidewall; Dorsum of Nose; Left Zygomatic Area (2); Left Parotid Area (2); Right Parotid Area; Left Malar Cheek; Left Buccal Cheek; Right Buccal Cheek; Right Forehead (2); Left Temporal Scalp; Right Temporal Scalp  Destruction of lesion - Dorsum of Nose, Left Buccal Cheek, Left Dorsal Hand (3), Left Forearm - Anterior, Left Frontal Scalp (2), Left Malar Cheek, Left Nasal Sidewall, Left Parotid Area (2), Left Shoulder - Anterior, Left Temporal Scalp, Left Upper Arm - Anterior, Left Zygomatic Area (2), Neck - Anterior, Right Breast, Right Buccal Cheek, Right Forearm - Anterior, Right Forehead (2), Right Parotid Area, Right Proximal Thumb, Right Temple, Right Temporal Scalp Complexity: simple   Destruction method: cryotherapy   Informed consent: discussed and consent obtained   Timeout:  patient name, date of birth, surgical site, and procedure verified Lesion destroyed using liquid nitrogen: Yes   Cryotherapy cycles:  3 Outcome: patient tolerated procedure well with no complications    Ordered Medications: Fluorouracil (TOLAK) 4 % CREA    I, Canda Podgorski, PA-C, have reviewed all documentation's for this visit.  The documentation on 10/27/20 for the exam, diagnosis, procedures and orders are all accurate and complete.

## 2020-10-28 ENCOUNTER — Other Ambulatory Visit: Payer: Self-pay | Admitting: Cardiology

## 2020-10-28 NOTE — Telephone Encounter (Signed)
33m, 74.6kg, Creatinine, Serum 1.210 MG/ 07/07/2020, ccr  49,lovw/hochrein 05/09/20

## 2020-10-29 ENCOUNTER — Other Ambulatory Visit: Payer: Self-pay | Admitting: Cardiology

## 2021-01-01 ENCOUNTER — Encounter: Payer: Self-pay | Admitting: Physician Assistant

## 2021-01-01 ENCOUNTER — Ambulatory Visit: Payer: Medicare Other | Admitting: Physician Assistant

## 2021-01-01 ENCOUNTER — Other Ambulatory Visit: Payer: Self-pay

## 2021-01-01 DIAGNOSIS — L570 Actinic keratosis: Secondary | ICD-10-CM | POA: Diagnosis not present

## 2021-01-01 NOTE — Progress Notes (Signed)
   Follow-Up Visit   Subjective  Joseph [name] is a 85 y.o. male who presents for the following: Follow-up (Here for follow up on [place], but patient did not do the treatment. /Patient also had some irritation on his arms and he used the triamcinolone cream and it helped him. ).   The following portions of the chart were reviewed this encounter and updated as appropriate:      Objective  Well appearing patient in no apparent distress; mood and affect are within normal limits.  A focused examination was performed including face, scalp and neck.. Relevant physical exam findings are noted in the Assessment and Plan.  Objective  Left Forehead (6), Right Posterior Neck: Erythematous patches with gritty scale.  Assessment & Plan  AK (actinic keratosis) (7) Right Posterior Neck; Left Forehead (6)  Destruction of lesion - Left Forehead, Right Posterior Neck Complexity: simple   Destruction method: cryotherapy   Informed consent: discussed and consent obtained   Timeout:  patient name, date of birth, surgical site, and procedure verified Lesion destroyed using liquid nitrogen: Yes   Cryotherapy cycles:  3 Outcome: patient tolerated procedure well with no complications    Other Related Medications Fluorouracil (TOLAK) 4 % CREA  Discussed the field treatment of his diffuse actinic damage. The directions and side effects were also discussed. He will call if he has any questions.  I, Stetson Pelaez, PA-C, have reviewed all documentation's for this visit.  The documentation on 01/01/21 for the exam, diagnosis, procedures and orders are all accurate and complete.

## 2021-01-23 ENCOUNTER — Other Ambulatory Visit: Payer: Self-pay | Admitting: Cardiology

## 2021-01-23 NOTE — Telephone Encounter (Signed)
Prescription refill request for Pradaxa received. Indication:atrial fib Last office visit:10/21 Scr:1.2 Age: 85 Weight:74.6 kg  Prescription refilled

## 2021-04-07 ENCOUNTER — Ambulatory Visit: Payer: Self-pay | Admitting: Orthopedic Surgery

## 2021-04-07 ENCOUNTER — Telehealth: Payer: Self-pay

## 2021-04-07 DIAGNOSIS — I482 Chronic atrial fibrillation, unspecified: Secondary | ICD-10-CM

## 2021-04-07 DIAGNOSIS — Z01818 Encounter for other preprocedural examination: Secondary | ICD-10-CM

## 2021-04-07 NOTE — Telephone Encounter (Signed)
Pre-op covering staff, please see note from Pharmacy below. Can you please notify patient that he will need to have labs drawn (CBC and BMET) and help set this up?  Thank you!

## 2021-04-07 NOTE — Telephone Encounter (Signed)
   [place] Pre-operative Risk Assessment    Patient Name: Joseph [name]  DOB: [birth year] MRN: [id number]  HEARTCARE STAFF:  - IMPORTANT!!!!!! Under Visit Info/Reason for Call, type in Other and utilize the format Clearance MM/DD/YY or Clearance TBD. Do not use dashes or single digits. - Please review there is not already an duplicate clearance open for this procedure. - If request is for dental extraction, please clarify the # of teeth to be extracted. - If the patient is currently at the dentist's office, call Pre-Op Callback Staff (MA/nurse) to input urgent request.  - If the patient is not currently in the dentist office, please route to the Pre-Op pool.  Request for surgical clearance:  What type of surgery is being performed? Lumbar Decompression and Insitu Fusion L4-5   When is this surgery scheduled? 05/06/21  What type of clearance is required (medical clearance vs. Pharmacy clearance to hold med vs. Both)? Medical   Are there any medications that need to be held prior to surgery and how long? None   Practice name and name of physician performing surgery? [facility] Dr. Melina Schools   What is the office phone number? [phone]   7.   What is the office fax number? [phone]  8.   Anesthesia type (None, local, MAC, general) ? General   Zebedee Iba 04/07/2021, 9:21 AM  _________________________________________________________________   (provider comments below)

## 2021-04-07 NOTE — Telephone Encounter (Signed)
Pharmacy, patient has upcoming lumbar decompression/fusion planned. Although not listed on clearance form, he is on Pradraxa for atrial fibrillation. Can you please comment on how long this can be held for upcoming procedure?  Thank you!

## 2021-04-07 NOTE — Telephone Encounter (Signed)
Patient has not had CBC in greater than 1 year, BMET in 9 months.  Please have him get both labs drawn for clearance.

## 2021-04-08 ENCOUNTER — Other Ambulatory Visit: Payer: Self-pay

## 2021-04-08 DIAGNOSIS — Z01818 Encounter for other preprocedural examination: Secondary | ICD-10-CM

## 2021-04-08 DIAGNOSIS — I482 Chronic atrial fibrillation, unspecified: Secondary | ICD-10-CM

## 2021-04-08 LAB — CBC
Hematocrit: 46.8 % (ref 37.5–51.0)
Hemoglobin: 16 g/dL (ref 13.0–17.7)
MCH: 33.8 pg — ABNORMAL HIGH (ref 26.6–33.0)
MCHC: 34.2 g/dL (ref 31.5–35.7)
MCV: 99 fL — ABNORMAL HIGH (ref 79–97)
Platelets: 222 10*3/uL (ref 150–450)
RBC: 4.74 x10E6/uL (ref 4.14–5.80)
RDW: 12.2 % (ref 11.6–15.4)
WBC: 7.4 10*3/uL (ref 3.4–10.8)

## 2021-04-08 LAB — BASIC METABOLIC PANEL
BUN/Creatinine Ratio: 13 (ref 10–24)
BUN: 16 mg/dL (ref 8–27)
CO2: 23 mmol/L (ref 20–29)
Calcium: 9.6 mg/dL (ref 8.6–10.2)
Chloride: 104 mmol/L (ref 96–106)
Creatinine, Ser: 1.26 mg/dL (ref 0.76–1.27)
Glucose: 89 mg/dL (ref 65–99)
Potassium: 5.3 mmol/L — ABNORMAL HIGH (ref 3.5–5.2)
Sodium: 143 mmol/L (ref 134–144)
eGFR: 57 mL/min/{1.73_m2} — ABNORMAL LOW (ref >59)

## 2021-04-08 NOTE — Telephone Encounter (Signed)
I am not sure if CBC can get added onto the lab from today. Let me find out.

## 2021-04-08 NOTE — Telephone Encounter (Signed)
Left message for the pt that he is going to need lab work for pre op clearance, BMET& CBC. I will place the orders for the lab work to be done at the [place] office where he see's Dr. Percival Spanish. I did ask for the pt to call the office to let us know when he is going to come in for the lab work so that we may keep a watch for results.

## 2021-04-08 NOTE — Telephone Encounter (Signed)
S/w Bernardo Heater, RN Clinic Supervisor who states she s/w lab tech [place]. Per Lilia Pro both labs were drawn today but stated that the lab orders came in different agency. Will keep an eye out for the CBC results to come in. I thanked Maudie Mercury for her help in this matter.

## 2021-04-08 NOTE — Telephone Encounter (Signed)
Message sent to [facility] lab and Clinic Supervisor to see if they can help to see if CBC can be added onto lab draw from BMET today.

## 2021-04-09 NOTE — Telephone Encounter (Signed)
    Patient Name: Joseph [name]  DOB: [birth year] MRN: [id number]  Primary Cardiologist: None  Chart reviewed as part of pre-operative protocol coverage. Patient was last seen by Dr. Percival Spanish in 05/2020 at which time he was doing well from a cardiac standpoint. Patient was contacted today for further pre-op evaluation and reported doing well since last visit. No chest pain, shortness of breath, orthopnea/PND, palpitations, lightheadedness/dizziness, syncope. Able to complete >4.0 METS without any anginal symptoms. Given past medical history and time since last visit, based on ACC/AHA guidelines, KRIST BUCHOLZ would be at acceptable risk for the planned procedure without further cardiovascular testing.   Per Pharmacy and office protocol, patient can hold Pradaxa for 4 days prior to procedure. Please restart this as soon as safely possible following procedure. Would recommend continuing Metoprolol perioperatively given history of atrial fibrillation.  The patient was advised that if he develops new symptoms prior to surgery to contact our office to arrange for a follow-up visit, and he verbalized understanding.  I will route this recommendation to the requesting party via Epic fax function and remove from pre-op pool.  Please call with questions.  Darreld Mclean, PA-C 04/09/2021, 2:13 PM

## 2021-04-09 NOTE — Telephone Encounter (Signed)
Patient with diagnosis of afib on Pradaxa for anticoagulation.    Procedure: lumbar decompression and insitu fusion L4-5 Date of procedure: 05/06/21  CHA2DS2-VASc Score = 2  This indicates a 2.2% annual risk of stroke. The patient's score is based upon: CHF History: No HTN History: No Diabetes History: No Stroke History: No Vascular Disease History: No Age Score: 2 Gender Score: 0   CrCl 95m/min Platelet count 222K  Per office protocol, patient can hold Pradaxa for 4 days prior to procedure.

## 2021-04-21 ENCOUNTER — Other Ambulatory Visit: Payer: Self-pay | Admitting: Cardiology

## 2021-04-21 NOTE — Telephone Encounter (Signed)
Prescription refill request for pradaxa received.  Indication:afib Last office visit:hochrein 05/09/20 Weight:74.6kg Age:78mScr:1.26 04/08/21 CrCl:46

## 2021-04-27 NOTE — Progress Notes (Signed)
Surgical Instructions    Your procedure is scheduled on Wednesday, September 28th, 2022.   Report to [facility] Main Entrance "A" at 09:00 A.M., then check in with the Admitting office.  Call this number if you have problems the morning of surgery:  [phone]   If you have any questions prior to your surgery date call [phone]: Open Monday-Friday 8am-4pm    Remember:  Do not eat after midnight the night before your surgery  You may drink clear liquids until 08:00 the morning of your surgery.   Clear liquids allowed are: Water, Non-Citrus Juices (without pulp), Carbonated Beverages, Clear Tea, Black Coffee ONLY (NO MILK, CREAM OR POWDERED CREAMER of any kind), and Gatorade    Take these medicines the morning of surgery with A SIP OF WATER:  metoprolol tartrate (LOPRESSOR) omeprazole (PRILOSEC)  acetaminophen (TYLENOL) - if needed ketotifen (ZADITOR) - if needed  Stop taking Pradaxa four days before surgery, per MD order. Last dose will be on 05/01/2021.  As of today, STOP taking any Aspirin (unless otherwise instructed by your surgeon) Aleve, Naproxen, Ibuprofen, Motrin, Advil, Goody's, BC's, all herbal medications, fish oil, and all vitamins.           Do not wear jewelry  Do not wear lotions, powders, colognes, or deodorant. Men may shave face and neck. Do not bring valuables to the hospital.              [facility] is not responsible for any belongings or valuables.  Do NOT Smoke (Tobacco/Vaping)  24 hours prior to your procedure If you use a CPAP at night, you may bring your mask for your overnight stay.   Contacts, glasses, dentures or bridgework may not be worn into surgery, please bring cases for these belongings   For patients admitted to the hospital, discharge time will be determined by your treatment team.   Patients discharged the day of surgery will not be allowed to drive home, and someone needs to stay with them for 24 hours.  NO VISITORS WILL BE  ALLOWED IN PRE-OP WHERE PATIENTS GET READY FOR SURGERY.  ONLY 1 SUPPORT PERSON MAY BE PRESENT WHILE YOU ARE IN SURGERY.  IF YOU ARE TO BE ADMITTED, ONCE YOU ARE IN YOUR ROOM YOU WILL BE ALLOWED TWO (2) VISITORS.  Minor children may have two parents present. Special consideration for safety and communication needs will be reviewed on a case by case basis.  Special instructions:    Oral Hygiene is also important to reduce your risk of infection.  Remember - BRUSH YOUR TEETH THE MORNING OF SURGERY WITH YOUR REGULAR TOOTHPASTE   [place]- Preparing For Surgery  Before surgery, you can play an important role. Because skin is not sterile, your skin needs to be as free of germs as possible. You can reduce the number of germs on your skin by washing with CHG (chlorahexidine gluconate) Soap before surgery.  CHG is an antiseptic cleaner which kills germs and bonds with the skin to continue killing germs even after washing.     Please do not use if you have an allergy to CHG or antibacterial soaps. If your skin becomes reddened/irritated stop using the CHG.  Do not shave (including legs and underarms) for at least 48 hours prior to first CHG shower. It is OK to shave your face.  Please follow these instructions carefully.     Shower the NIGHT BEFORE SURGERY and the MORNING OF SURGERY with CHG Soap.   If  you chose to wash your hair, wash your hair first as usual with your normal shampoo. After you shampoo, rinse your hair and body thoroughly to remove the shampoo.  Then [employer] and genitals (private parts) with your normal soap and rinse thoroughly to remove soap.  After that Use CHG Soap as you would any other liquid soap. You can apply CHG directly to the skin and wash gently with a scrungie or a clean washcloth.   Apply the CHG Soap to your body ONLY FROM THE NECK DOWN.  Do not use on open wounds or open sores. Avoid contact with your eyes, ears, mouth and genitals (private parts). Wash Face and  genitals (private parts)  with your normal soap.   Wash thoroughly, paying special attention to the area where your surgery will be performed.  Thoroughly rinse your body with warm water from the neck down.  DO NOT shower/wash with your normal soap after using and rinsing off the CHG Soap.  Pat yourself dry with a CLEAN TOWEL.  Wear CLEAN PAJAMAS to bed the night before surgery  Place CLEAN SHEETS on your bed the night before your surgery  DO NOT SLEEP WITH PETS.   Day of Surgery:  Take a shower with CHG soap. Wear Clean/Comfortable clothing the morning of surgery Do not apply any deodorants/lotions.   Remember to brush your teeth WITH YOUR REGULAR TOOTHPASTE.   Please read over the following fact sheets that you were given.

## 2021-04-28 ENCOUNTER — Other Ambulatory Visit: Payer: Self-pay

## 2021-04-28 ENCOUNTER — Ambulatory Visit: Payer: Self-pay | Admitting: Orthopedic Surgery

## 2021-04-28 ENCOUNTER — Encounter (HOSPITAL_COMMUNITY): Payer: Self-pay

## 2021-04-28 ENCOUNTER — Encounter (HOSPITAL_COMMUNITY)
Admission: RE | Admit: 2021-04-28 | Discharge: 2021-04-28 | Disposition: A | Discharge status: Home / Self Care | Payer: Medicare Other | Source: Ambulatory Visit | Attending: Orthopedic Surgery | Admitting: Orthopedic Surgery

## 2021-04-28 DIAGNOSIS — Z7901 Long term (current) use of anticoagulants: Secondary | ICD-10-CM | POA: Diagnosis not present

## 2021-04-28 DIAGNOSIS — I482 Chronic atrial fibrillation, unspecified: Secondary | ICD-10-CM | POA: Diagnosis not present

## 2021-04-28 DIAGNOSIS — Z01812 Encounter for preprocedural laboratory examination: Secondary | ICD-10-CM | POA: Insufficient documentation

## 2021-04-28 DIAGNOSIS — Z79899 Other long term (current) drug therapy: Secondary | ICD-10-CM | POA: Insufficient documentation

## 2021-04-28 HISTORY — DX: Essential (primary) hypertension: I10

## 2021-04-28 HISTORY — DX: Gastro-esophageal reflux disease without esophagitis: K21.9

## 2021-04-28 HISTORY — DX: Pneumonia, unspecified organism: J18.9

## 2021-04-28 HISTORY — DX: Cardiac arrhythmia, unspecified: I49.9

## 2021-04-28 LAB — APTT: aPTT: 40 seconds — ABNORMAL HIGH (ref 24–36)

## 2021-04-28 LAB — BASIC METABOLIC PANEL WITH GFR
Anion gap: 10 (ref 5–15)
BUN: 12 mg/dL (ref 8–23)
CO2: 24 mmol/L (ref 22–32)
Calcium: 9.2 mg/dL (ref 8.9–10.3)
Chloride: 105 mmol/L (ref 98–111)
Creatinine, Ser: 1.36 mg/dL — ABNORMAL HIGH (ref 0.61–1.24)
GFR, Estimated: 52 mL/min — ABNORMAL LOW
Glucose, Bld: 73 mg/dL (ref 70–99)
Potassium: 4.1 mmol/L (ref 3.5–5.1)
Sodium: 139 mmol/L (ref 135–145)

## 2021-04-28 LAB — URINALYSIS, ROUTINE W REFLEX MICROSCOPIC
Bilirubin Urine: NEGATIVE
Glucose, UA: NEGATIVE mg/dL
Ketones, ur: NEGATIVE mg/dL
Leukocytes,Ua: NEGATIVE
Nitrite: NEGATIVE
Protein, ur: NEGATIVE mg/dL
Specific Gravity, Urine: 1.02 (ref 1.005–1.030)
pH: 6 (ref 5.0–8.0)

## 2021-04-28 LAB — PROTIME-INR
INR: 1.3 — ABNORMAL HIGH (ref 0.8–1.2)
Prothrombin Time: 15.9 seconds — ABNORMAL HIGH (ref 11.4–15.2)

## 2021-04-28 LAB — URINALYSIS, MICROSCOPIC (REFLEX): Bacteria, UA: NONE SEEN

## 2021-04-28 LAB — CBC
HCT: 47.6 % (ref 39.0–52.0)
Hemoglobin: 15.8 g/dL (ref 13.0–17.0)
MCH: 33.8 pg (ref 26.0–34.0)
MCHC: 33.2 g/dL (ref 30.0–36.0)
MCV: 101.9 fL — ABNORMAL HIGH (ref 80.0–100.0)
Platelets: 219 10*3/uL (ref 150–400)
RBC: 4.67 MIL/uL (ref 4.22–5.81)
RDW: 12.9 % (ref 11.5–15.5)
WBC: 7.4 10*3/uL (ref 4.0–10.5)
nRBC: 0 % (ref 0.0–0.2)

## 2021-04-28 LAB — TYPE AND SCREEN
ABO/RH(D): A POS
Antibody Screen: NEGATIVE

## 2021-04-28 LAB — SURGICAL PCR SCREEN
MRSA, PCR: NEGATIVE
Staphylococcus aureus: NEGATIVE

## 2021-04-28 NOTE — H&P (Signed)
Subjective:   Joseph [name] is a pleasant 85 year old male who is here today for preop H&P. His chief complaint is low back pain and bilateral lower extremity dysesthesias worse when he stands or walks for any period of time, consistent with spinal stenosis. Imaging studies demonstrate severe spinal stenosis that is unlikely to respond to the conservative care including injections and/or formalized physical therapy. After discussing treatment options and surgical intervention as well as the risks of surgical intervention the patient has elected to move forward with surgery. He is scheduled for Lumbar decompression insti fusion L4-5 At [facility] On 05/06/21 With Dr. Rolena Infante.  Patient Active Problem List   Diagnosis Date Noted   Educated about COVID-19 virus infection 05/08/2020   Chronic atrial fibrillation ([place]) 05/08/2020   Prostate cancer ([place]) 01/17/2015   TMJ arthralgia 06/21/2012   PAF (paroxysmal atrial fibrillation) ([place]) 12/22/2011   Atrial fibrillation with rapid ventricular response ([place]) 12/11/2010   Diverticulitis of colon with perforation 12/11/2010   Past Medical History:  Diagnosis Date   Arthritis    Atrial fibrillation ([place])    resolved with cardioversion   Complication of anesthesia    Dysrhythmia    Atrial Fibrilation   GERD (gastroesophageal reflux disease)    Headache    occasional   History of skin cancer    Hypertension    Pneumonia    PONV (postoperative nausea and vomiting)    Prostate cancer ([place])    Sigmoid diverticulitis April of 2012   with small perforation   Squamous cell carcinoma     Past Surgical History:  Procedure Laterality Date   CARDIOVERSION  01/07/2009   LASIK     LYMPHADENECTOMY Bilateral 01/17/2015   Procedure: PELVIC LYMPHADENECTOMY;  Surgeon: Alexis Frock, MD;  Location: [facility];  Service: Urology;  Laterality: Bilateral;   NASAL SEPTUM SURGERY     ROBOT ASSISTED LAPAROSCOPIC RADICAL PROSTATECTOMY N/A 01/17/2015   Procedure: ROBOTIC ASSISTED  LAPAROSCOPIC RADICAL PROSTATECTOMY WITH INDOCYANINE GREEN DYE INJECTION;  Surgeon: Alexis Frock, MD;  Location: [facility];  Service: Urology;  Laterality: N/A;   SHOULDER SURGERY     x 2   TONSILLECTOMY     as a child    Current Outpatient Medications  Medication Sig Dispense Refill Last Dose   acetaminophen (TYLENOL) 650 MG CR tablet Take 1,300 mg by mouth 2 (two) times daily as needed for pain.      Camphor-Menthol-Methyl Sal (SALONPAS) 3.08-14-08 % PTCH Place 1 patch onto the skin daily as needed (back/shoulder pain).      dabigatran (PRADAXA) 150 MG CAPS capsule TAKE ONE CAPSULE BY MOUTH TWICE DAILY 180 capsule 1    diclofenac Sodium (VOLTAREN) 1 % GEL Apply 2 g topically daily as needed (pain).      Fluorouracil (TOLAK) 4 % CREA Apply to affected area qhs x 2 weeks (Patient not taking: No sig reported) 40 g 1    ketotifen (ZADITOR) 0.025 % ophthalmic solution Place 1 drop into both eyes daily as needed (itchy eyes).      metoprolol tartrate (LOPRESSOR) 25 MG tablet TAKE ONE TABLET BY MOUTH TWICE DAILY 180 tablet 2    omeprazole (PRILOSEC) 20 MG capsule Take 20 mg by mouth in the morning.      triamcinolone (KENALOG) 0.1 % Apply to affected area- NOT for face, folds or groin (Patient not taking: No sig reported) 454 g 3    TURMERIC CURCUMIN PO Take 1,000 mg by mouth in the morning.      No  current facility-administered medications for this visit.   Allergies  Allergen Reactions   Tetracycline Hcl Rash    Social History   Tobacco Use   Smoking status: Never   Smokeless tobacco: Never  Substance Use Topics   Alcohol use: Yes    Comment: 5 drinks per week    Family History  Problem Relation Age of Onset   Other Mother        no neg hx   Other Father        no neg hx    Review of Systems Pertinent items are noted in HPI.  Objective:   Vitals: Ht: 5 ft 6 in 04/28/2021 01:26 pm Wt: 165.5 lbs 04/28/2021 01:31 pm BMI: 26.7 04/28/2021 01:31 pm BP: 124/78 04/28/2021 01:34  pm O2Sat: 96% 04/28/2021 01:34 pm  General: Alert and oriented 3, no apparent distress  Ambulation: Abnormal given spinal stenosis, uses cane assistive device  Heart: Regular rate and rhythm, no rubs, murmurs, or gallops  Lungs: Clear to auscultation bilaterally  Abdomen: Bowel sounds 4, nondistended, nontender, no rebound tenderness. Baseline urinary leakage status post prostate surgery. No new issues controlling bladder or bowel  Mani continues to have significant buttock and proximal thigh pain. Positive relief of neuropathic pain with forward flexion intensified with extension of the lumbar spine. Clinical symptoms consistent with lumbar spinal stenosis with neurogenic claudication.  Lumbar MRI: completed on 03/22/21 was reviewed with the patient. It was completed at [facility]; I have independently reviewed the images as well as the radiology report. Severe L4-5 spinal canal stenosis with redundancy of the nerve roots. Severe right lateral recess stenosis. Mild to moderate degenerative changes L2-4. No significant canal stenosis. Mild to moderate foraminal narrowing at L5-S1. Minimal anterior listhesis L4-5.  Assessment:   [name] returns today for follow-up. At this point time he states that at one time he is very athletic and active but this back buttock and neurogenic claudication pain has significantly affected his quality-of-life. Imaging studies clearly demonstrate severe spinal stenosis at L4-5. At this point we briefly discussed injection therapy versus surgical intervention. Given the severity of his spinal stenosis and neurogenic claudication I think it is reasonable to move forward with a lumbar decompression at L4-5 followed by an in situ fusion to prevent worsening of the spondylolisthesis. I have gone over the surgical procedure in great detail with the patient and his wife and all their questions were addressed. Should be noted the patient does have urinary incontinence following  his prostate cancer surgery.  Risks and benefits of decompression/discectomy: Infection, bleeding, death, stroke, paralysis, ongoing or worse pain, need for additional surgery, leak of spinal fluid, adjacent segment degeneration requiring additional surgery, post-operative hematoma formation that can result in neurological compromise and the need for urgent/emergent re-operation. Loss in bowel and bladder control. Injury to major vessels that could result in the need for urgent abdominal surgery to stop bleeding. Risk of deep venous thrombosis (DVT) and the need for additional treatment. Recurrent disc herniation resulting in the need for revision surgery, which could include fusion surgery (utilizing instrumentation such as pedicle screws and intervertebral cages).   Plan:   L4-5 decompression and in situ fusion  We have obtained preoperative medical clearance from the patient's primary care provider and cardiologist. He will hold his Pradaxa 4 days prior to surgery we will plan to restart at 48 hours after surgery. He is not using any aspirin products or over-the-counter or prescription anti-inflammatories. He will also stop his over-the-counter vitamins and  supplements.  He is scheduled for LSO brace fitting tomorrow. He had his preop testing at [facility] this morning.  We have also discussed the post-operative recovery period to include: bathing/showering restrictions, wound healing, activity (and driving) restrictions, medications/pain mangement.  We have also discussed post-operative redflags to include: signs and symptoms of postoperative infection, DVT/PE.  Discharge instructions given to the patient today. They were reviewed with him and his wife. All questions were invited and answered.  Follow-up: 2 weeks postop

## 2021-04-28 NOTE — H&P (Deleted)
  The note originally documented on this encounter has been moved the the encounter in which it belongs.  

## 2021-04-28 NOTE — Progress Notes (Signed)
Abnormal labs in PAT - APTT 40; PT 15.9; Hgb urine dipstick - small. Dr. Rolena Infante office was called with these labs results.

## 2021-04-28 NOTE — Progress Notes (Signed)
PCP - [name], MD Cardiologist - [name], MD  PPM/ICD - denies Device Orders - n/a Rep Notified - n/a  Chest x-ray - n/a EKG - 05/09/2020 Stress Test - 25 years ago (negative per patient) ECHO - 05/30/2015 Cardiac Cath - denies  Sleep Study - denies CPAP - n/a  Fasting Blood Sugar - n/a  Blood Thinner Instructions: Pradaxa - last dose - 05/01/2021  Aspirin Instructions: Patient was instructed: As of today, STOP taking any Aspirin (unless otherwise instructed by your surgeon) Aleve, Naproxen, Ibuprofen, Motrin, Advil, Goody's, BC's, all herbal medications, fish oil, and all vitamins.  ERAS Protcol - no  COVID TEST- the test was scheduled for 05/04/2021 @ 09:15 o'clock. Patient verbalized understanding.   Anesthesia review: yes, per MD order  Patient denies shortness of breath, fever, cough and chest pain at PAT appointment   All instructions explained to the patient, with a verbal understanding of the material. Patient agrees to go over the instructions while at home for a better understanding. Patient also instructed to self quarantine after being tested for COVID-19. The opportunity to ask questions was provided.

## 2021-04-29 NOTE — Progress Notes (Signed)
Anesthesia Chart Review:  Follows cardiology for history of chronic A. fib maintained on Pradaxa.  Cardiac clearance per telephone encounter 04/09/2021, "Chart reviewed as part of pre-operative protocol coverage. Patient was last seen by Dr. Percival Spanish in 05/2020 at which time he was doing well from a cardiac standpoint. Patient was contacted today for further pre-op evaluation and reported doing well since last visit. No chest pain, shortness of breath, orthopnea/PND, palpitations, lightheadedness/dizziness, syncope. Able to complete >4.0 METS without any anginal symptoms. Given past medical history and time since last visit, based on ACC/AHA guidelines, Joseph [name] would be at acceptable risk for the planned procedure without further cardiovascular testing. Per Pharmacy and office protocol, patient can hold Pradaxa for 4 days prior to procedure. Please restart this as soon as safely possible following procedure. Would recommend continuing Metoprolol perioperatively given history of atrial fibrillation."  Patient reported last dose of Pradaxa 05/01/2021.  Preop labs reviewed, unremarkable.  EKG 05/09/2020: Atrial fibrillation.  Rate 77.  TTE 06/03/2015: - Left ventricle: The cavity size was normal. Wall thickness was    increased in a pattern of mild LVH. Systolic function was normal.    The estimated ejection fraction was in the range of 60% to 65%.    Wall motion was normal; there were no regional wall motion    abnormalities. Doppler parameters are consistent with abnormal    left ventricular relaxation (grade 1 diastolic dysfunction). The    E/e&' ratio is between 8-15, suggesting indeterminate LV filling    pressure.  - Aortic valve: Trileaflet; moderately calcified non-coronary cusp.    There was no stenosis. There was no regurgitation.  - Mitral valve: Calcified annulus. There was trivial regurgitation.  - Left atrium: The atrium was normal in size.  - Tricuspid valve: There was no  significant regurgitation.  - Inferior vena cava: The vessel was normal in size. The    respirophasic diameter changes were in the normal range (= 50%),    consistent with normal central venous pressure.   Impressions:   - Compared to a prior echo in 2012, the EF is higher at 60-65%,    there is now diastolic dysfunction, mild LVH and a calcified    non-coronary cusp of the aortic valve without stenosis.   Wynonia Musty [facility] Short Stay Center/Anesthesiology Phone [phone] 04/29/2021 4:17 PM

## 2021-04-29 NOTE — Anesthesia Preprocedure Evaluation (Addendum)
Anesthesia Evaluation  Patient identified by MRN, date of birth, ID band Patient awake    Reviewed: Allergy & Precautions, NPO status , Patient's Chart, lab work & pertinent test results  History of Anesthesia Complications (+) PONV  Airway Mallampati: II  TM Distance: <3 FB Neck ROM: Limited    Dental no notable dental hx.    Pulmonary neg pulmonary ROS,    Pulmonary exam normal breath sounds clear to auscultation       Cardiovascular hypertension, + dysrhythmias Atrial Fibrillation  Rhythm:Irregular Rate:Normal     Neuro/Psych negative neurological ROS  negative psych ROS   GI/Hepatic negative GI ROS, Neg liver ROS,   Endo/Other  negative endocrine ROS  Renal/GU negative Renal ROS  negative genitourinary   Musculoskeletal negative musculoskeletal ROS (+)   Abdominal   Peds negative pediatric ROS (+)  Hematology negative hematology ROS (+)   Anesthesia Other Findings   Reproductive/Obstetrics negative OB ROS                           Anesthesia Physical Anesthesia Plan  ASA: 3  Anesthesia Plan: General   Post-op Pain Management:    Induction: Intravenous  PONV Risk Score and Plan: 3 and Ondansetron, Dexamethasone and Treatment may vary due to age or medical condition  Airway Management Planned: Oral ETT  Additional Equipment:   Intra-op Plan:   Post-operative Plan: Extubation in OR  Informed Consent: I have reviewed the patients History and Physical, chart, labs and discussed the procedure including the risks, benefits and alternatives for the proposed anesthesia with the patient or authorized representative who has indicated his/her understanding and acceptance.     Dental advisory given  Plan Discussed with: CRNA and Surgeon  Anesthesia Plan Comments: (PAT note by Karoline Caldwell, PA-C: [place] [facility] for history of chronic A. fib maintained on Pradaxa.  Cardiac  clearance per telephone encounter 04/09/2021, "Chart reviewed as part of pre-operative protocol coverage.Patient was last seen by Dr. Percival Spanish in 05/2020 at which time he was doing well from a cardiac standpoint. Patient was contacted today for further pre-op evaluation and reported doing well since last visit. No chest pain, shortness of breath, orthopnea/PND, palpitations, lightheadedness/dizziness, syncope. Able to complete >4.0 METS without any anginal symptoms.Given past medical history and time since last visit, based on ACC/AHA guidelines,Joseph E Daviswould be at acceptable risk for the planned procedure without further cardiovascular testing. Per Pharmacy and office protocol, patient can hold Pradaxa for 4 days prior to procedure. Please restart this as soon as safely possible following procedure. Would recommend continuing Metoprolol perioperatively given history of atrial fibrillation."  Patient reported last dose of Pradaxa 05/01/2021.  Preop labs reviewed, unremarkable.  EKG 05/09/2020: Atrial fibrillation.  Rate 77.  TTE 06/03/2015: - Left ventricle: The cavity size was normal. Wall thickness was  increased in a pattern of mild LVH. Systolic function was normal.  The estimated ejection fraction was in the range of 60% to 65%.  Wall motion was normal; there were no regional wall motion  abnormalities. Doppler parameters are consistent with abnormal  left ventricular relaxation (grade 1 diastolic dysfunction). The  E/e&' ratio is between 8-15, suggesting indeterminate LV filling  pressure.  - Aortic valve: Trileaflet; moderately calcified non-coronary cusp.  There was no stenosis. There was no regurgitation.  - Mitral valve: Calcified annulus. There was trivial regurgitation.  - Left atrium: The atrium was normal in size.  - Tricuspid valve: There was no significant  regurgitation.  - Inferior vena cava: The vessel was normal in size. The  respirophasic diameter  changes were in the normal range (= 50%),  consistent with normal central venous pressure.   Impressions:   - Compared to a prior echo in 2012, the EF is higher at 60-65%,  there is now diastolic dysfunction, mild LVH and a calcified  non-coronary cusp of the aortic valve without stenosis.  )       Anesthesia Quick Evaluation

## 2021-05-04 ENCOUNTER — Other Ambulatory Visit: Payer: Self-pay

## 2021-05-04 ENCOUNTER — Other Ambulatory Visit (HOSPITAL_COMMUNITY)
Admission: RE | Admit: 2021-05-04 | Discharge: 2021-05-04 | Disposition: A | Discharge status: Home / Self Care | Payer: Medicare Other | Source: Ambulatory Visit | Attending: Orthopedic Surgery | Admitting: Orthopedic Surgery

## 2021-05-04 DIAGNOSIS — Z20822 Contact with and (suspected) exposure to covid-19: Secondary | ICD-10-CM | POA: Insufficient documentation

## 2021-05-04 DIAGNOSIS — Z01812 Encounter for preprocedural laboratory examination: Secondary | ICD-10-CM | POA: Diagnosis present

## 2021-05-04 LAB — SARS CORONAVIRUS 2 (TAT 6-24 HRS): SARS Coronavirus 2: NEGATIVE

## 2021-05-06 ENCOUNTER — Observation Stay (HOSPITAL_COMMUNITY)
Admission: RE | Admit: 2021-05-06 | Discharge: 2021-05-07 | Disposition: A | Discharge status: Home / Self Care | Payer: Medicare Other | Attending: Orthopedic Surgery | Admitting: Orthopedic Surgery

## 2021-05-06 ENCOUNTER — Ambulatory Visit (HOSPITAL_COMMUNITY): Payer: Medicare Other

## 2021-05-06 ENCOUNTER — Encounter (HOSPITAL_COMMUNITY): Payer: Self-pay | Admitting: Orthopedic Surgery

## 2021-05-06 ENCOUNTER — Encounter (HOSPITAL_COMMUNITY)
Admission: RE | Disposition: A | Discharge status: Home / Self Care | Payer: Self-pay | Source: Home / Self Care | Attending: Orthopedic Surgery

## 2021-05-06 ENCOUNTER — Other Ambulatory Visit: Payer: Self-pay

## 2021-05-06 ENCOUNTER — Ambulatory Visit (HOSPITAL_COMMUNITY): Payer: Medicare Other | Admitting: Physician Assistant

## 2021-05-06 ENCOUNTER — Ambulatory Visit (HOSPITAL_COMMUNITY): Payer: Medicare Other | Admitting: Anesthesiology

## 2021-05-06 DIAGNOSIS — Z8616 Personal history of COVID-19: Secondary | ICD-10-CM | POA: Diagnosis not present

## 2021-05-06 DIAGNOSIS — Z8546 Personal history of malignant neoplasm of prostate: Secondary | ICD-10-CM | POA: Insufficient documentation

## 2021-05-06 DIAGNOSIS — Z419 Encounter for procedure for purposes other than remedying health state, unspecified: Secondary | ICD-10-CM

## 2021-05-06 DIAGNOSIS — I10 Essential (primary) hypertension: Secondary | ICD-10-CM | POA: Insufficient documentation

## 2021-05-06 DIAGNOSIS — M5136 Other intervertebral disc degeneration, lumbar region: Secondary | ICD-10-CM | POA: Diagnosis not present

## 2021-05-06 DIAGNOSIS — M48062 Spinal stenosis, lumbar region with neurogenic claudication: Secondary | ICD-10-CM | POA: Diagnosis present

## 2021-05-06 DIAGNOSIS — I482 Chronic atrial fibrillation, unspecified: Secondary | ICD-10-CM | POA: Diagnosis not present

## 2021-05-06 DIAGNOSIS — Z79899 Other long term (current) drug therapy: Secondary | ICD-10-CM | POA: Diagnosis not present

## 2021-05-06 DIAGNOSIS — M4316 Spondylolisthesis, lumbar region: Secondary | ICD-10-CM | POA: Insufficient documentation

## 2021-05-06 DIAGNOSIS — Z85828 Personal history of other malignant neoplasm of skin: Secondary | ICD-10-CM | POA: Diagnosis not present

## 2021-05-06 DIAGNOSIS — M4800 Spinal stenosis, site unspecified: Secondary | ICD-10-CM | POA: Diagnosis present

## 2021-05-06 HISTORY — PX: LUMBAR LAMINECTOMY/DECOMPRESSION MICRODISCECTOMY: SHX5026

## 2021-05-06 SURGERY — LUMBAR LAMINECTOMY/DECOMPRESSION MICRODISCECTOMY 1 LEVEL
Anesthesia: General

## 2021-05-06 MED ORDER — LIDOCAINE HCL (PF) 2 % IJ SOLN
INTRAMUSCULAR | Status: AC
  Filled 2021-05-06: qty 5

## 2021-05-06 MED ORDER — CEFAZOLIN SODIUM-DEXTROSE 2-4 GM/100ML-% IV SOLN
INTRAVENOUS | Status: AC
  Filled 2021-05-06: qty 100

## 2021-05-06 MED ORDER — METHOCARBAMOL 1000 MG/10ML IJ SOLN
500.0000 mg | Freq: Four times a day (QID) | INTRAVENOUS | Status: DC | PRN
  Filled 2021-05-06: qty 5

## 2021-05-06 MED ORDER — BUPIVACAINE-EPINEPHRINE 0.25% -1:200000 IJ SOLN
INTRAMUSCULAR | Status: DC | PRN
  Administered 2021-05-06: 10 mL

## 2021-05-06 MED ORDER — ONDANSETRON HCL 4 MG/2ML IJ SOLN: 4.0000 mg | Freq: Once | INTRAMUSCULAR | Status: DC | PRN

## 2021-05-06 MED ORDER — ROCURONIUM BROMIDE 10 MG/ML (PF) SYRINGE
PREFILLED_SYRINGE | INTRAVENOUS | Status: AC
  Filled 2021-05-06: qty 10

## 2021-05-06 MED ORDER — ORAL CARE MOUTH RINSE: 15.0000 mL | Freq: Once | OROMUCOSAL | Status: AC

## 2021-05-06 MED ORDER — SODIUM CHLORIDE 0.9 % IV SOLN: 250.0000 mL | INTRAVENOUS | Status: DC

## 2021-05-06 MED ORDER — CEFAZOLIN SODIUM-DEXTROSE 1-4 GM/50ML-% IV SOLN
1.0000 g | Freq: Three times a day (TID) | INTRAVENOUS | Status: AC
  Administered 2021-05-06 – 2021-05-07 (×2): 1 g via INTRAVENOUS
  Filled 2021-05-06 (×2): qty 50

## 2021-05-06 MED ORDER — PHENOL 1.4 % MT LIQD: 1.0000 | OROMUCOSAL | Status: DC | PRN

## 2021-05-06 MED ORDER — HEMOSTATIC AGENTS (NO CHARGE) OPTIME
TOPICAL | Status: DC | PRN
  Administered 2021-05-06: 1 via TOPICAL

## 2021-05-06 MED ORDER — ONDANSETRON HCL 4 MG/2ML IJ SOLN
INTRAMUSCULAR | Status: AC
  Filled 2021-05-06: qty 2

## 2021-05-06 MED ORDER — ONDANSETRON HCL 4 MG PO TABS: 4.0000 mg | ORAL_TABLET | Freq: Four times a day (QID) | ORAL | Status: DC | PRN

## 2021-05-06 MED ORDER — LIDOCAINE HCL (CARDIAC) PF 100 MG/5ML IV SOSY
PREFILLED_SYRINGE | INTRAVENOUS | Status: DC | PRN
  Administered 2021-05-06: 100 mg via INTRAVENOUS

## 2021-05-06 MED ORDER — LACTATED RINGERS IV SOLN: INTRAVENOUS | Status: DC

## 2021-05-06 MED ORDER — MIDAZOLAM HCL 2 MG/2ML IJ SOLN
INTRAMUSCULAR | Status: AC
  Filled 2021-05-06: qty 2

## 2021-05-06 MED ORDER — BUPIVACAINE-EPINEPHRINE (PF) 0.25% -1:200000 IJ SOLN
INTRAMUSCULAR | Status: AC
  Filled 2021-05-06: qty 30

## 2021-05-06 MED ORDER — DEXAMETHASONE SODIUM PHOSPHATE 10 MG/ML IJ SOLN
INTRAMUSCULAR | Status: AC
  Filled 2021-05-06: qty 1

## 2021-05-06 MED ORDER — METHOCARBAMOL 500 MG PO TABS: 500.0000 mg | ORAL_TABLET | Freq: Three times a day (TID) | ORAL | 0 refills | Status: AC | PRN

## 2021-05-06 MED ORDER — FLEET ENEMA 7-19 GM/118ML RE ENEM: 1.0000 | ENEMA | Freq: Once | RECTAL | Status: DC | PRN

## 2021-05-06 MED ORDER — ROCURONIUM BROMIDE 100 MG/10ML IV SOLN
INTRAVENOUS | Status: DC | PRN
  Administered 2021-05-06: 10 mg via INTRAVENOUS
  Administered 2021-05-06: 70 mg via INTRAVENOUS
  Administered 2021-05-06: 10 mg via INTRAVENOUS

## 2021-05-06 MED ORDER — ONDANSETRON HCL 4 MG PO TABS: 4.0000 mg | ORAL_TABLET | Freq: Three times a day (TID) | ORAL | 0 refills | Status: DC | PRN

## 2021-05-06 MED ORDER — HYDROMORPHONE HCL 1 MG/ML IJ SOLN
0.5000 mg | INTRAMUSCULAR | Status: AC | PRN
Start: 2021-05-06 — End: 2021-05-07
  Administered 2021-05-06: 0.5 mg via INTRAVENOUS
  Filled 2021-05-06: qty 0.5

## 2021-05-06 MED ORDER — PHENYLEPHRINE HCL-NACL 20-0.9 MG/250ML-% IV SOLN
INTRAVENOUS | Status: DC | PRN
  Administered 2021-05-06: 50 ug/min via INTRAVENOUS

## 2021-05-06 MED ORDER — OXYCODONE HCL 5 MG/5ML PO SOLN
5.0000 mg | Freq: Once | ORAL | Status: DC | PRN
Start: 2021-05-06 — End: 2021-05-06

## 2021-05-06 MED ORDER — OXYCODONE HCL 5 MG PO TABS
5.0000 mg | ORAL_TABLET | ORAL | Status: DC | PRN
  Administered 2021-05-07 (×3): 5 mg via ORAL
  Filled 2021-05-06 (×3): qty 1

## 2021-05-06 MED ORDER — ONDANSETRON HCL 4 MG/2ML IJ SOLN
INTRAMUSCULAR | Status: DC | PRN
  Administered 2021-05-06: 4 mg via INTRAVENOUS

## 2021-05-06 MED ORDER — FENTANYL CITRATE (PF) 100 MCG/2ML IJ SOLN
INTRAMUSCULAR | Status: DC | PRN
  Administered 2021-05-06: 100 ug via INTRAVENOUS
  Administered 2021-05-06: 50 ug via INTRAVENOUS

## 2021-05-06 MED ORDER — OXYCODONE HCL 5 MG PO TABS
10.0000 mg | ORAL_TABLET | ORAL | Status: DC | PRN
  Administered 2021-05-06 (×2): 10 mg via ORAL
  Filled 2021-05-06 (×2): qty 2

## 2021-05-06 MED ORDER — ACETAMINOPHEN 325 MG PO TABS
650.0000 mg | ORAL_TABLET | ORAL | Status: DC | PRN
  Administered 2021-05-07: 650 mg via ORAL
  Filled 2021-05-06: qty 2

## 2021-05-06 MED ORDER — 0.9 % SODIUM CHLORIDE (POUR BTL) OPTIME
TOPICAL | Status: DC | PRN
  Administered 2021-05-06: 1000 mL

## 2021-05-06 MED ORDER — ACETAMINOPHEN 650 MG RE SUPP: 650.0000 mg | RECTAL | Status: DC | PRN

## 2021-05-06 MED ORDER — SODIUM CHLORIDE 0.9% FLUSH
3.0000 mL | Freq: Two times a day (BID) | INTRAVENOUS | Status: DC
  Administered 2021-05-06: 3 mL via INTRAVENOUS

## 2021-05-06 MED ORDER — OXYCODONE HCL 5 MG PO TABS: 5.0000 mg | ORAL_TABLET | Freq: Once | ORAL | Status: DC | PRN

## 2021-05-06 MED ORDER — FENTANYL CITRATE (PF) 250 MCG/5ML IJ SOLN
INTRAMUSCULAR | Status: AC
  Filled 2021-05-06: qty 5

## 2021-05-06 MED ORDER — GABAPENTIN 300 MG PO CAPS
300.0000 mg | ORAL_CAPSULE | Freq: Three times a day (TID) | ORAL | Status: DC
  Administered 2021-05-06 – 2021-05-07 (×3): 300 mg via ORAL
  Filled 2021-05-06 (×3): qty 1

## 2021-05-06 MED ORDER — CHLORHEXIDINE GLUCONATE 0.12 % MT SOLN
15.0000 mL | Freq: Once | OROMUCOSAL | Status: AC
  Administered 2021-05-06: 15 mL via OROMUCOSAL
  Filled 2021-05-06: qty 15

## 2021-05-06 MED ORDER — SUGAMMADEX SODIUM 500 MG/5ML IV SOLN
INTRAVENOUS | Status: DC | PRN
  Administered 2021-05-06: 400 mg via INTRAVENOUS

## 2021-05-06 MED ORDER — MENTHOL 3 MG MT LOZG: 1.0000 | LOZENGE | OROMUCOSAL | Status: DC | PRN

## 2021-05-06 MED ORDER — DEXAMETHASONE SODIUM PHOSPHATE 10 MG/ML IJ SOLN
INTRAMUSCULAR | Status: DC | PRN
  Administered 2021-05-06: 10 mg via INTRAVENOUS

## 2021-05-06 MED ORDER — MIDAZOLAM HCL 5 MG/5ML IJ SOLN
INTRAMUSCULAR | Status: DC | PRN
Start: 2021-05-06 — End: 2021-05-06
  Administered 2021-05-06: 1 mg via INTRAVENOUS

## 2021-05-06 MED ORDER — METHYLPREDNISOLONE ACETATE 40 MG/ML IJ SUSP
INTRAMUSCULAR | Status: AC
  Filled 2021-05-06: qty 1

## 2021-05-06 MED ORDER — PROPOFOL 10 MG/ML IV BOLUS
INTRAVENOUS | Status: AC
  Filled 2021-05-06: qty 20

## 2021-05-06 MED ORDER — THROMBIN 20000 UNITS EX SOLR
CUTANEOUS | Status: AC
  Filled 2021-05-06: qty 20000

## 2021-05-06 MED ORDER — CEFAZOLIN SODIUM-DEXTROSE 2-4 GM/100ML-% IV SOLN
2.0000 g | INTRAVENOUS | Status: AC
  Administered 2021-05-06: 2 g via INTRAVENOUS

## 2021-05-06 MED ORDER — OXYCODONE-ACETAMINOPHEN 10-325 MG PO TABS: 1.0000 | ORAL_TABLET | Freq: Four times a day (QID) | ORAL | 0 refills | Status: AC | PRN

## 2021-05-06 MED ORDER — POLYETHYLENE GLYCOL 3350 17 G PO PACK: 17.0000 g | PACK | Freq: Every day | ORAL | Status: DC | PRN

## 2021-05-06 MED ORDER — SODIUM CHLORIDE 0.9% FLUSH: 3.0000 mL | INTRAVENOUS | Status: DC | PRN

## 2021-05-06 MED ORDER — METOPROLOL TARTRATE 25 MG PO TABS
25.0000 mg | ORAL_TABLET | Freq: Two times a day (BID) | ORAL | Status: DC
  Administered 2021-05-06: 25 mg via ORAL
  Filled 2021-05-06: qty 1

## 2021-05-06 MED ORDER — METHOCARBAMOL 500 MG PO TABS
500.0000 mg | ORAL_TABLET | Freq: Four times a day (QID) | ORAL | Status: DC | PRN
  Administered 2021-05-06 – 2021-05-07 (×3): 500 mg via ORAL
  Filled 2021-05-06 (×3): qty 1

## 2021-05-06 MED ORDER — THROMBIN 20000 UNITS EX SOLR
CUTANEOUS | Status: DC | PRN
  Administered 2021-05-06: 20 mL via TOPICAL

## 2021-05-06 MED ORDER — ACETAMINOPHEN 10 MG/ML IV SOLN: 1000.0000 mg | Freq: Once | INTRAVENOUS | Status: DC | PRN

## 2021-05-06 MED ORDER — HYDROMORPHONE HCL 1 MG/ML IJ SOLN: 0.2500 mg | INTRAMUSCULAR | Status: DC | PRN

## 2021-05-06 MED ORDER — ONDANSETRON HCL 4 MG/2ML IJ SOLN
4.0000 mg | Freq: Four times a day (QID) | INTRAMUSCULAR | Status: DC | PRN
  Administered 2021-05-06: 4 mg via INTRAVENOUS
  Filled 2021-05-06: qty 2

## 2021-05-06 MED ORDER — PROPOFOL 10 MG/ML IV BOLUS
INTRAVENOUS | Status: DC | PRN
  Administered 2021-05-06: 100 mg via INTRAVENOUS
  Administered 2021-05-06: 50 mg via INTRAVENOUS

## 2021-05-06 SURGICAL SUPPLY — 57 items
BAG COUNTER SPONGE SURGICOUNT (BAG) ×2 IMPLANT
BNDG GAUZE ELAST 4 BULKY (GAUZE/BANDAGES/DRESSINGS) IMPLANT
BUR EGG ELITE 4.0 (BURR) ×2 IMPLANT
BUR MATCHSTICK NEURO 3.0 LAGG (BURR) IMPLANT
CANISTER SUCT 3000ML PPV (MISCELLANEOUS) ×2 IMPLANT
CLSR STERI-STRIP ANTIMIC 1/2X4 (GAUZE/BANDAGES/DRESSINGS) ×2 IMPLANT
CORD BIPOLAR FORCEPS 12FT (ELECTRODE) ×2 IMPLANT
COVER SURGICAL LIGHT HANDLE (MISCELLANEOUS) ×2 IMPLANT
DRAIN CHANNEL 15F RND FF W/TCR (WOUND CARE) IMPLANT
DRAPE POUCH INSTRU U-SHP 10X18 (DRAPES) ×2 IMPLANT
DRAPE SURG 17X23 STRL (DRAPES) ×2 IMPLANT
DRAPE U-SHAPE 47X51 STRL (DRAPES) ×2 IMPLANT
DRSG OPSITE POSTOP 4X6 (GAUZE/BANDAGES/DRESSINGS) ×2 IMPLANT
DURAPREP 26ML APPLICATOR (WOUND CARE) ×2 IMPLANT
ELECT BLADE 4.0 EZ CLEAN MEGAD (MISCELLANEOUS)
ELECT CAUTERY BLADE 6.4 (BLADE) ×2 IMPLANT
ELECT PENCIL ROCKER SW 15FT (MISCELLANEOUS) ×2 IMPLANT
ELECT REM PT RETURN 9FT ADLT (ELECTROSURGICAL) ×2
ELECTRODE BLDE 4.0 EZ CLN MEGD (MISCELLANEOUS) IMPLANT
ELECTRODE REM PT RTRN 9FT ADLT (ELECTROSURGICAL) ×1 IMPLANT
EVACUATOR SILICONE 100CC (DRAIN) IMPLANT
GLOVE SURG ENC MOIS LTX SZ6.5 (GLOVE) ×2 IMPLANT
GLOVE SURG MICRO LTX SZ8.5 (GLOVE) ×2 IMPLANT
GLOVE SURG UNDER POLY LF SZ6.5 (GLOVE) ×2 IMPLANT
GLOVE SURG UNDER POLY LF SZ8.5 (GLOVE) ×2 IMPLANT
GOWN STRL REUS W/ TWL LRG LVL3 (GOWN DISPOSABLE) ×2 IMPLANT
GOWN STRL REUS W/TWL 2XL LVL3 (GOWN DISPOSABLE) ×2 IMPLANT
GOWN STRL REUS W/TWL LRG LVL3 (GOWN DISPOSABLE) ×4
KIT BASIN OR (CUSTOM PROCEDURE TRAY) ×2 IMPLANT
KIT TURNOVER KIT B (KITS) ×2 IMPLANT
MILL MEDIUM DISP (BLADE) ×2 IMPLANT
NEEDLE 22X1 1/2 (OR ONLY) (NEEDLE) ×2 IMPLANT
NEEDLE SPNL 18GX3.5 QUINCKE PK (NEEDLE) ×6 IMPLANT
NS IRRIG 1000ML POUR BTL (IV SOLUTION) ×2 IMPLANT
PACK LAMINECTOMY ORTHO (CUSTOM PROCEDURE TRAY) ×2 IMPLANT
PACK UNIVERSAL I (CUSTOM PROCEDURE TRAY) ×2 IMPLANT
PAD ARMBOARD 7.5X6 YLW CONV (MISCELLANEOUS) ×4 IMPLANT
PATTIES SURGICAL .5 X.5 (GAUZE/BANDAGES/DRESSINGS) ×4 IMPLANT
PATTIES SURGICAL .5 X1 (DISPOSABLE) ×2 IMPLANT
SPONGE SURGIFOAM ABS GEL 100 (HEMOSTASIS) ×2 IMPLANT
SPONGE T-LAP 18X18 ~~LOC~~+RFID (SPONGE) ×2 IMPLANT
SPONGE T-LAP 4X18 ~~LOC~~+RFID (SPONGE) ×8 IMPLANT
SURGIFLO W/THROMBIN 8M KIT (HEMOSTASIS) ×2 IMPLANT
SUT BONE WAX W31G (SUTURE) ×2 IMPLANT
SUT MNCRL AB 3-0 PS2 27 (SUTURE) ×2 IMPLANT
SUT VIC AB 0 CT1 27 (SUTURE)
SUT VIC AB 0 CT1 27XBRD ANBCTR (SUTURE) IMPLANT
SUT VIC AB 1 CT1 18XCR BRD 8 (SUTURE) ×1 IMPLANT
SUT VIC AB 1 CT1 8-18 (SUTURE) ×2
SUT VIC AB 2-0 CT1 18 (SUTURE) ×2 IMPLANT
SYR 20ML LL LF (SYRINGE) ×2 IMPLANT
SYR BULB IRRIG 60ML STRL (SYRINGE) IMPLANT
SYR CONTROL 10ML LL (SYRINGE) ×2 IMPLANT
TOWEL GREEN STERILE (TOWEL DISPOSABLE) ×2 IMPLANT
TOWEL GREEN STERILE FF (TOWEL DISPOSABLE) ×2 IMPLANT
WATER STERILE IRR 1000ML POUR (IV SOLUTION) ×2 IMPLANT
YANKAUER SUCT BULB TIP NO VENT (SUCTIONS) ×2 IMPLANT

## 2021-05-06 NOTE — OR Nursing (Signed)
Per protocol Dr. Burt Ek called on 05-06-2021 at 1138 and confirmed the location of L4 and L5 for surgery. MV, RN.

## 2021-05-06 NOTE — Anesthesia Procedure Notes (Signed)
Procedure Name: Intubation Date/Time: 05/06/2021 10:54 AM Performed by: Jonna Munro, CRNA Pre-anesthesia Checklist: Patient identified, Emergency Drugs available, Suction available, Patient being monitored and Timeout performed Patient Re-evaluated:Patient Re-evaluated prior to induction Oxygen Delivery Method: Circle system utilized Preoxygenation: Pre-oxygenation with 100% oxygen Induction Type: IV induction Ventilation: Mask ventilation without difficulty Laryngoscope Size: Mac and 3 Grade View: Grade I Tube type: Oral Tube size: 7.5 mm Number of attempts: 1 Airway Equipment and Method: Stylet Placement Confirmation: ETT inserted through vocal cords under direct vision, positive ETCO2 and breath sounds checked- equal and bilateral Secured at: 22 cm Tube secured with: Tape Dental Injury: Teeth and Oropharynx as per pre-operative assessment

## 2021-05-06 NOTE — Discharge Instructions (Addendum)
Laminectomy, Care After  This sheet gives you information about how to care for yourself after your procedure. Your health care provider may also give you more specific instructions. If you have problems or questions, contact your health care provider. What can I expect after the procedure? After the procedure, it is common to have: Some pain around your incision area. Muscle tightening (spasms) across the back.   Follow these instructions at home: Incision care Follow instructions from your health care provider about how to take care of your incision area. Make sure you: Wash your hands with soap and water before and after you apply medicine to the area or change your bandage (dressing). If soap and water are not available, use hand sanitizer. Change your dressing as told by your health care provider. Leave stitches (sutures), skin glue, or adhesive strips in place. These skin closures may need to stay in place for 2 weeks or longer. If adhesive strip edges start to loosen and curl up, you may trim the loose edges. Do not remove adhesive strips completely unless your health care provider tells you to do that.  Check your incision area every day for signs of infection. Check for: More redness, swelling, or pain. More fluid or blood. Warmth. Pus or a bad smell. Medicines Take over-the-counter and prescription medicines only as told by your health care provider. If you were prescribed an antibiotic medicine, use it as told by your health care provider. Do not stop using the antibiotic even if you start to feel better. If needed, call office in 3 days to request refill of pain medications. Bathing Do not take baths, swim, or use a hot tub for 6 weeks, or until your incision has healed completely. If your health care provider approves, you may take showers after your dressing has been removed. Ok to shower in 5 days Activity Return to your normal activities as told by your health care  provider. Ask your health care provider what activities are safe for you. Avoid bending or twisting at your waist. Always bend at your knees. Do not sit for more than 20-30 minutes at a time. Lie down or walk between periods of sitting. Do not lift anything that is heavier than 10 lb (4.5 kg) or the limit that your health care provider tells you, until he or she says that it is safe. Do not drive for 2 weeks after your procedure or for as long as your health care provider tells you.  Do not drive or use heavy machinery while taking prescription pain medicine. General instructions To prevent or treat constipation while you are taking prescription pain medicine, your health care provider may recommend that you: Drink enough fluid to keep your urine clear or pale yellow. Take over-the-counter or prescription medicines. Eat foods that are high in fiber, such as fresh fruits and vegetables, whole grains, and beans. Limit foods that are high in fat and processed sugars, such as fried and sweet foods. Do breathing exercises as told. Keep all follow-up visits as told by your health care provider. This is important. Contact a health care provider if: You have more redness, swelling, or pain around your incision area. Your incision feels warm to the touch. You are not able to return to activities or do exercises as told by your health care provider. Get help right away if: You have: More fluid or blood coming from your incision area. Pus or a bad smell coming from your incision area. Chills or a  fever. Episodes of dizziness or fainting while standing. You develop a rash. You develop shortness of breath or you have difficulty breathing. You cannot control when you urinate or have a bowel movement. You become weak. You are not able to use your legs. Summary After the procedure, it is common to have some pain around your incision area. You may also have muscle tightening (spasms) across the  back. Follow instructions from your health care provider about how to care for your incision. Do not lift anything that is heavier than 10 lb (4.5 kg) or the limit that your health care provider tells you, until he or she says that it is safe. Contact your health care provider if you have more redness, swelling, or pain around your incision area or if your incision feels warm to the touch. These can be signs of infection. This information is not intended to replace advice given to you by your health care provider. Make sure you discuss any questions you have with your health care provider. Refer to this sheet in the next few weeks. These instructions provide you with information about caring for yourself after your procedure. Your health care provider may also give you more specific instructions. Your treatment has been planned according to current medical practices, but problems sometimes occur. Call your health care provider if you have any problems or questions after your procedure. What can I expect after the procedure? It is common to have pain for the first few days after the procedure. Some people continue to have mild pain even after making a full recovery. Follow these instructions at home: Medicine Take medicines only as directed by your health care provider. Avoid taking over-the-counter pain medicines unless your health care provider tells you otherwise. These medicines interfere with the development and growth of new bone cells. If you were prescribed a narcotic pain medicine, take it exactly as told by your health care provider. Do not drink alcohol while on the medicine. Do not drive while on the medicine. Injury care Care for your back brace as told by your health care provider. If directed, apply ice to the injured area: Put ice in a plastic bag. Place a towel between your skin and the bag. Leave the ice on for 20 minutes, 2-3 times a day. Activity Perform physical therapy  exercises as told by your health care provider. Exercise regularly. Start by taking short walks. Slowly increase your activity level over time. Gentle exercise helps to ease pain. Sit, stand, walk, turn in bed, and reposition yourself as told by your health care provider. This will help to keep your spine in proper alignment. Avoid bending and twisting your body. Avoid doing strenuous household chores, such as vacuuming. Do not lift anything that is heavier than 10 lb (4.5 kg). Other Instructions Keep all follow-up visits as directed by your health care provider. This is important. Do not use any tobacco products, including cigarettes, chewing tobacco, or electronic cigarettes. If you need help quitting, ask your health care provider. Nicotine affects the way bones heal. Contact a health care provider if: Your pain gets worse. You have a fever. You have redness, swelling, or pain at the site of your incision. You have fluid, blood, or pus coming from your incision. You have numbness, tingling, or weakness in any part of your body. Get help right away if: Your incision feels swollen and tender, and the surrounding area looks like a lump. The lump may be red or bluish in color.  You cannot move any part of your body (paralysis). You cannot control your bladder or bowels.   RESTART PRADAXA ON 05/08/21 (Friday: Call MD if you experience hematoma formation/bleeding, loss and bowel or bladder function, or weakness in the lower extremity).

## 2021-05-06 NOTE — Progress Notes (Signed)
Orthopedic Tech Progress Note Patient Details:  Joseph [name] 1936/01/09 [id number]  Ortho Devices Type of Ortho Device: Lumbar corsett Ortho Device/Splint Location: BACK Ortho Device/Splint Interventions: Ordered   Post Interventions Patient Tolerated: Well Instructions Provided: Care of device  Janit Pagan 05/06/2021, 3:22 PM

## 2021-05-06 NOTE — H&P (Signed)
Addendum H&P: There has been no change in the patient's clinical exam.  He continues to have back buttock and neuropathic leg pain.  Plan on moving forward with a lumbar decompression and in situ fusion.  I have reviewed the risks, benefits, and alternatives to surgery with him and all of his questions were addressed.

## 2021-05-06 NOTE — Brief Op Note (Signed)
05/06/2021  1:13 PM  PATIENT:  Joseph [name]  85 y.o. male  PRE-OPERATIVE DIAGNOSIS:  Lumbar spinal stenosis with spondylothersis  POST-OPERATIVE DIAGNOSIS:  Lumbar spinal stenosis with spondylothersis  PROCEDURE:  Procedure(s) with comments: Lumbar decompression and insitu fusion Lumbar four - Lumbar five (N/A) - 3 hrs 3 C-Bed  SURGEON:  Surgeon(s) and Role:    Melina Schools, MD - Primary  PHYSICIAN ASSISTANT:   ASSISTANTS: Nelson Chimes, PA  ANESTHESIA:   general  EBL:  100 mL   BLOOD ADMINISTERED:none  DRAINS: none   LOCAL MEDICATIONS USED:  MARCAINE     SPECIMEN:  No Specimen  DISPOSITION OF SPECIMEN:  N/A  COUNTS:  YES  TOURNIQUET:  * No tourniquets in log *  DICTATION: .Dragon Dictation  PLAN OF CARE: Admit to inpatient   PATIENT DISPOSITION:  PACU - hemodynamically stable.

## 2021-05-06 NOTE — Op Note (Signed)
OPERATIVE REPORT  DATE OF SURGERY: 05/06/2021  PATIENT NAME:  Joseph [name] MRN: [id number] DOB: 28-Jul-1936  PCP: [name], MD  PRE-OPERATIVE DIAGNOSIS: Lumbar spinal stenosis with neurogenic claudication  POST-OPERATIVE DIAGNOSIS: Same  PROCEDURE:   L4-5 lumbar decompression with in situ fusion  SURGEON:  Melina Schools, MD  PHYSICIAN ASSISTANT: Nelson Chimes, PA100  ANESTHESIA:   General  EBL: 570 ml   Complications: None  Graft: Local autograft from decompression  BRIEF HISTORY: Joseph [name] is a 85 y.o. male who has significant back and neuropathic leg pain.  Attempts at conservative management have failed to alleviate his symptoms.  As result we elected to move forward with a lumbar decompression in situ fusion.  Appropriate risks, benefits, and alternatives to surgery were discussed and consent was obtained.  PROCEDURE DETAILS: Patient was brought into the operating room and was properly positioned on the operating room table.  After induction with general anesthesia the patient was endotracheally intubated.  A timeout was taken to confirm all important data: including patient, procedure, and the level. Teds, SCD's were applied.   Patient was turned from onto the Wilson frame and all bony prominences were well-padded.  The back was then prepped and draped in a standard fashion.  2 needles were placed in the back and an x-ray was taken to confirm the level for our incision.  I marked out the incision site and infiltrated with half percent Marcaine with epinephrine.  Midline incision was made and sharp dissection was carried out down to the deep fascia the deep fascia was sharply incised and I stripped the paraspinal muscles off the periosteum to expose the L4 and L5 spinous process as well as the L4-5 facet complex.  Penfield 4 was placed underneath the L4 lamina and another x-ray was taken.  I confirmed I was at the L4-5 level and proceeded with the  decompression.  Double-action Leksell rongeur was used to remove the bulk of the L4 spinous process and the superior portion of the L5 spinous process.  There was significant thickening of the ligamentum flavum consistent with what was seen on the preoperative MRI.  Using a Kerrison rongeur I performed a generous laminotomy bilaterally of L4.  With the bulk of the laminotomy performed I proceeded with removing the ligamentum flavum.  Penfield 4 was used to dissect through the central raphae until I was able to develop a plane between the ligamentum flavum and the thecal sac.  As expected this was significantly redundant and thickened ligamentum flavum.  A neuro patty was placed to define the plane between the ligamentum flavum and the thecal sac.  Kerrison rongeurs were then used to remove the ligamentum flavum and expose the dorsal surface of the thecal sac.  I then continued working into the lateral recess removing the ligamentum flavum and performing a medial facetectomy.  I took my decompression out laterally until I could visualize the medial aspect of the L5 pedicle.  I then traced the L5 nerve root into the foramen and decompress the with a Kerrison rongeur.  The epidural vein and posterior lateral corner at the level of the disc space was identified and coagulated with bipolar electrocautery.  I then continued my dissection superiorly until I was above the disc space.  I could palpate the L4 pedicle and pass my nerve hook at the L4 foramen.  I then went to the contralateral side and using the same technique performed a lateral recess decompression as well as foraminotomies of  L4 and L5.  I made sure that my decompression spanned the maximum area of compression seen on the preoperative MRI.  A final x-ray was taken confirming that I decompressions went from the L4 pedicle down to the L5 foramen.  This encompasses the maximum area of stenosis seen on the preoperative MRI.  At this point I irrigated the  wound copiously normal saline and obtain hemostasis using bipolar electrocautery.  The facet joint was identified and the capsule was removed and identify the L4 transverse process.  Using a high-speed bur I decorticated the lateral wall of the facet complex and the L4 transverse process.  The bone graft that had been harvested from the decompression was then placed in the posterior lateral gutter bilaterally.  The wound was then copiously irrigated again and I checked 1 final time to ensure we had hemostasis as well as an adequate decompression spanning the area of stenosis.  Once this was confirmed I then removed my retractors and proceeded to close the wound.  The deep fascia was closed with interrupted #1 Vicryl suture, then a layer 2-0 Vicryl suture, and finally 3-0 Monocryl for the skin.  Steri-Strips and a dry dressing were applied and the patient was ultimately extubated transfer the PACU without incident.  The end of the case all needle and sponge counts were correct.  There were no adverse intraoperative events.  There is no evidence of CSF leak at the conclusion of the case.  Melina Schools, MD 05/06/2021 1:05 PM

## 2021-05-06 NOTE — Anesthesia Postprocedure Evaluation (Signed)
Anesthesia Post Note  Patient: Joseph [name]  Procedure(s) Performed: Lumbar decompression and insitu fusion Lumbar four - Lumbar five     Patient location during evaluation: PACU Anesthesia Type: General Level of consciousness: awake and alert Pain management: pain level controlled Vital Signs Assessment: post-procedure vital signs reviewed and stable Respiratory status: spontaneous breathing, nonlabored ventilation, respiratory function stable and patient connected to nasal cannula oxygen Cardiovascular status: blood pressure returned to baseline and stable Postop Assessment: no apparent nausea or vomiting Anesthetic complications: no   No notable events documented.  Last Vitals:  Vitals:   05/06/21 1415 05/06/21 1448  BP: (!) 117/95 116/70  Pulse: 87 87  Resp: 20 16  Temp: (!) 36.4 C 36.6 C  SpO2: 93% 98%    Last Pain:  Vitals:   05/06/21 1400  TempSrc:   PainSc: 0-No pain                 Grove Defina S

## 2021-05-06 NOTE — Transfer of Care (Signed)
Immediate Anesthesia Transfer of Care Note  Patient: Joseph [name]  Procedure(s) Performed: Lumbar decompression and insitu fusion Lumbar four - Lumbar five  Patient Location: PACU  Anesthesia Type:General  Level of Consciousness: awake, alert  and patient cooperative  Airway & Oxygen Therapy: Patient Spontanous Breathing and Patient connected to face mask oxygen  Post-op Assessment: Report given to RN, Post -op Vital signs reviewed and stable and Patient moving all extremities X 4  Post vital signs: Reviewed and stable  Last Vitals:  Vitals Value Taken Time  BP 125/70 05/06/21 1330  Temp 36.3 C 05/06/21 1330  Pulse 85 05/06/21 1337  Resp 13 05/06/21 1337  SpO2 89 % 05/06/21 1337  Vitals shown include unvalidated device data.  Last Pain:  Vitals:   05/06/21 0939  TempSrc:   PainSc: 5          Complications: No notable events documented.

## 2021-05-07 ENCOUNTER — Encounter (HOSPITAL_COMMUNITY): Payer: Self-pay | Admitting: Orthopedic Surgery

## 2021-05-07 DIAGNOSIS — M48062 Spinal stenosis, lumbar region with neurogenic claudication: Secondary | ICD-10-CM | POA: Diagnosis not present

## 2021-05-07 NOTE — Evaluation (Addendum)
Physical Therapy Evaluation  Patient Details Name: Joseph [name] MRN: [id number] DOB: [birth year] Today's Date: 05/07/2021  History of Present Illness  Pt is a 85 y.o. male s/p L4-5 fusion on 9/28. PMH significant for Arthritis, A fib , HTN, ca, and shoudler surgery.  Clinical Impression  Pt admitted with above diagnosis. At the time of PT eval, pt was able to demonstrate transfers and ambulation with gross min guard assist to min assist and RW for support. Pt was educated on precautions, brace application/wearing schedule, appropriate activity progression, and car transfer. Pt currently with functional limitations due to the deficits listed below (see PT Problem List). Pt will benefit from skilled PT to increase their independence and safety with mobility to allow discharge to the venue listed below.         Recommendations for follow up therapy are one component of a multi-disciplinary discharge planning process, led by the attending physician.  Recommendations may be updated based on patient status, additional functional criteria and insurance authorization.  Follow Up Recommendations Home health PT;Supervision for mobility/OOB    Equipment Recommendations  None recommended by PT    Recommendations for Other Services       Precautions / Restrictions Precautions Precautions: Back;Fall Precaution Booklet Issued: Yes (comment) Precaution Comments: Reviewed handout and pt was cued for precautions during functional mobility. Required Braces or Orthoses: Spinal Brace Spinal Brace: Lumbar corset;Applied in sitting position Restrictions Weight Bearing Restrictions: No      Mobility  Bed Mobility Overal bed mobility: Needs Assistance Bed Mobility: Rolling;Sidelying to Sit;Sit to Sidelying Rolling: Min guard Sidelying to sit: Min guard     Sit to sidelying: Min guard General bed mobility comments: Practiced log roll several times with HOB flat and rails lowered to simulate home  environment. Pt was able to complete without assistance, however required hands on guarding for optimal positioning.    Transfers Overall transfer level: Needs assistance Equipment used: Rolling walker (2 wheeled) Transfers: Sit to/from Stand Sit to Stand: Min assist;Min guard         General transfer comment: VC's for hand placement on seated surface for safety, wide BOS, and scooting out to the edge of the seat prior to powering up to full stand.  Ambulation/Gait Ambulation/Gait assistance: Min guard Gait Distance (Feet): 400 Feet Assistive device: Rolling walker (2 wheeled) Gait Pattern/deviations: Step-through pattern;Decreased stride length;Trunk flexed Gait velocity: Decreased Gait velocity interpretation: <1.8 ft/sec, indicate of risk for recurrent falls General Gait Details: VC's for improved posture, closer walker proximity, and forward gaze. Pt initially with short step/stride length however able to make corrective changes with cues.  Stairs            Wheelchair Mobility    Modified Rankin (Stroke Patients Only)       Balance Overall balance assessment: Needs assistance Sitting-balance support: Feet supported;Bilateral upper extremity supported Sitting balance-Leahy Scale: Poor Sitting balance - Comments: Continual posterior lean. Postural control: Posterior lean Standing balance support: Bilateral upper extremity supported Standing balance-Leahy Scale: Poor Standing balance comment: Reliant on UE support                             Pertinent Vitals/Pain Pain Assessment: Faces Faces Pain Scale: Hurts little more Pain Location: Incision site/back Pain Descriptors / Indicators: Operative site guarding;Grimacing Pain Intervention(s): Limited activity within patient's tolerance;Monitored during session;Repositioned    Home Living Family/patient expects to be discharged to:: Other (Comment) Living Arrangements: Spouse/significant  other Available Help at Discharge: Family;Available 24 hours/day Type of Home: House Home Access: Level entry (from garage)     Home Layout: One level Home Equipment: Walker - 2 wheels;Shower seat;Bedside commode;Cane - quad;Grab bars - tub/shower Additional Comments: [place] living facility    Prior Function Level of Independence: Independent with assistive device(s)         Comments: Pt reports Independent living performs cooking and cleaning. Pt was still driving and used quad cane for functional mobility.     Hand Dominance        Extremity/Trunk Assessment   Upper Extremity Assessment Upper Extremity Assessment: Defer to OT evaluation    Lower Extremity Assessment Lower Extremity Assessment: Generalized weakness    Cervical / Trunk Assessment Cervical / Trunk Assessment: Other exceptions Cervical / Trunk Exceptions: s/p Lumbar fusion  Communication   Communication: No difficulties  Cognition Arousal/Alertness: Awake/alert Behavior During Therapy: WFL for tasks assessed/performed Overall Cognitive Status: Within Functional Limits for tasks assessed                                 General Comments: Pt needing increased time to follow commands and complete ADLs.      General Comments General comments (skin integrity, edema, etc.): Son and wife present througout.    Exercises     Assessment/Plan    PT Assessment Patient needs continued PT services  PT Problem List Decreased strength;Decreased activity tolerance;Decreased balance;Decreased mobility;Decreased knowledge of use of DME;Decreased safety awareness;Decreased knowledge of precautions;Pain       PT Treatment Interventions DME instruction;Gait training;Functional mobility training;Therapeutic activities;Therapeutic exercise;Neuromuscular re-education;Patient/family education    PT Goals (Current goals can be found in the Care Plan section)  Acute Rehab PT Goals Patient  Stated Goal: To get back home PT Goal Formulation: With patient Time For Goal Achievement: 05/14/21 Potential to Achieve Goals: Good    Frequency Min 5X/week   Barriers to discharge        Co-evaluation               AM-PAC PT "6 Clicks" Mobility  Outcome Measure Help needed turning from your back to your side while in a flat bed without using bedrails?: A Little Help needed moving from lying on your back to sitting on the side of a flat bed without using bedrails?: A Little Help needed moving to and from a bed to a chair (including a wheelchair)?: A Little Help needed standing up from a chair using your arms (e.g., wheelchair or bedside chair)?: A Little Help needed to walk in hospital room?: A Little Help needed climbing 3-5 steps with a railing? : A Little 6 Click Score: 18    End of Session Equipment Utilized During Treatment: Gait belt;Back brace Activity Tolerance: Patient tolerated treatment well Patient left: in chair;with call bell/phone within reach;with family/visitor present Nurse Communication: Mobility status PT Visit Diagnosis: Unsteadiness on feet (R26.81);Pain Pain - part of body:  (back)    Time: 0258-5277 PT Time Calculation (min) (ACUTE ONLY): 47 min   Charges:   PT Evaluation $PT Eval Low Complexity: 1 Low PT Treatments $Gait Training: 23-37 mins        Rolinda Roan, PT, DPT Acute Rehabilitation Services Pager: [phone] Office: [phone]   Thelma Comp 05/07/2021, 1:04 PM

## 2021-05-07 NOTE — Progress Notes (Signed)
    Subjective: Procedure(s) (LRB): Lumbar decompression and insitu fusion Lumbar four - Lumbar five (N/A) 1 Day Post-Op  Patient reports pain as 2 on 0-10 scale.  Reports decreased leg pain reports incisional back pain   Positive void Negative bowel movement Negative flatus Negative chest pain or shortness of breath  Objective: Vital signs in last 24 hours: Temp:  [97.3 F (36.3 C)-98.8 F (37.1 C)] 98.8 F (37.1 C) (09/29 0841) Pulse Rate:  [66-96] 92 (09/29 0841) Resp:  [13-20] 18 (09/29 0841) BP: (94-125)/(58-95) 105/78 (09/29 0841) SpO2:  [91 %-98 %] 95 % (09/29 0841)  Intake/Output from previous day: 09/28 0701 - 09/29 0700 In: 1300 [I.V.:1200; IV Piggyback:100] Out: 100 [Blood:100]  Labs: No results for input(s): WBC, RBC, HCT, PLT in the last 72 hours. No results for input(s): NA, K, CL, CO2, BUN, CREATININE, GLUCOSE, CALCIUM in the last 72 hours. No results for input(s): LABPT, INR in the last 72 hours.  Physical Exam: Neurologically intact ABD soft Intact pulses distally Incision: dressing C/D/I and no drainage Compartment soft Body mass index is 25.7 kg/m.   Assessment/Plan: Patient stable  Patient is doing exceptionally well Continue mobilization with physical therapy Continue care  Plan on discharge to home today or in the morning.  Prior to discharge I do want a positive bowel movement or flatus to ensure that his GI function has returned.  Overall though he is doing quite well and I expect him to continue to make progress.  He will follow-up with me in 2 weeks.  Melina Schools, MD [facility] [phone]

## 2021-05-07 NOTE — Progress Notes (Signed)
CSW contacted by RN to arrange PT services for pt in ALF at [facility].  CSW spoke with Angela Nevin at [facility] [phone]) and she requested [facility] orders for PT be faxed to [phone], which was done.  Angela Nevin has epic access and will get the DC summary herself. Lurline Idol, MSW, LCSW 9/29/202212:51 PM

## 2021-05-07 NOTE — Progress Notes (Signed)
Patient was transported via wheelchair by volunteer for discharge to ALF [facility]); with complaints of moderate pain on his back incision and was medicated before leaving the unit;  honeycomb dressing on his back was clean, dry and intact; patient states he was passing flatus as witnessed by family members; room was checked and accounted for all his belongings; discharge instructions was discussed with patient and family members by RN regarding his medications, follow up appointment, wound care and when to call the doctor as needed and they all verbalized understanding on the instructions given.

## 2021-05-07 NOTE — Evaluation (Signed)
Occupational Therapy Evaluation Patient Details Name: Joseph [name] MRN: [id number] DOB: 1936/07/09 Today's Date: 05/07/2021   History of Present Illness Pt is a 85 y.o. male s/p L4-5 fusion on 9/28. PMH significant for Arthritis, A fib , HTN, ca, and shoudler surgery.   Clinical Impression   PTA, pt was living at a retirement community with his wife, independent in ADLs with use of a quad cane, and was driving. Pt currently requiring Min A for UB ADLs, LB ADLs, and functional mobility. Pt educated on compensatory strategies for ADL and functional mobility to adhere to precautions, given handout. Pt requiring mod verbal cues for safety and adhering to precautions initially, progressing to min verbal cues by the end of the session. Due to pt demonstration of precautions and caregiver support, recommending home with 24 hr supervision post d/c. All education completed in preparation for d/c today.      Recommendations for follow up therapy are one component of a multi-disciplinary discharge planning process, led by the attending physician.  Recommendations may be updated based on patient status, additional functional criteria and insurance authorization.   Follow Up Recommendations  No OT follow up;Supervision/Assistance - 24 hour    Equipment Recommendations  None recommended by OT    Recommendations for Other Services       Precautions / Restrictions Precautions Precautions: Back;Fall Precaution Booklet Issued: Yes (comment) Precaution Comments: Verbally reviewed and given handout Required Braces or Orthoses: Spinal Brace Spinal Brace: Lumbar corset;Applied in standing position Restrictions Weight Bearing Restrictions: No      Mobility Bed Mobility Overal bed mobility: Needs Assistance Bed Mobility: Rolling;Sidelying to Sit           General bed mobility comments: Pt educated on logrolling technique, requiring min A to push up into sitting and maintain balance in sitting  initially.    Transfers Overall transfer level: Needs assistance Equipment used: Rolling walker (2 wheeled) Transfers: Sit to/from Stand Sit to Stand: Min assist         General transfer comment: For balancing once in standing, pt with posterior lean when standing initially    Balance Overall balance assessment: Needs assistance Sitting-balance support: Feet supported;Bilateral upper extremity supported Sitting balance-Leahy Scale: Poor Sitting balance - Comments: Static and dynamic sitting poor progressing to fair by end of session. Required therapist support due to posterior lean. Postural control: Posterior lean Standing balance support: Bilateral upper extremity supported Standing balance-Leahy Scale: Poor Standing balance comment: Pt with requiring therapist assist to maintain balance upon functional mobility to and in bathroom with RW.                           ADL either performed or assessed with clinical judgement   ADL Overall ADL's : Needs assistance/impaired Eating/Feeding: Modified independent   Grooming: Standing;Wash/dry hands;Cueing for safety;Min guard Grooming Details (indicate cue type and reason): For safety and min verbal cues for safety Upper Body Bathing: Set up;Sitting;Minimal assistance   Lower Body Bathing: Sit to/from stand;Minimal assistance   Upper Body Dressing : Set up;Minimal assistance;Standing;Sitting Upper Body Dressing Details (indicate cue type and reason): Pt requiring Min A to adjust initial straps on lumbar brace correctly and to maintain balance in standing. Wife, son, and pt educated on correct brace donning and wear. Lower Body Dressing: Sit to/from stand;Minimal assistance Lower Body Dressing Details (indicate cue type and reason): Pt required min A to maintain dynamic sitting balance while using figure four position to  donn pants and shoes. Toilet Transfer: Minimal assistance;BSC;Cueing for safety;Cueing for  sequencing;RW Toilet Transfer Details (indicate cue type and reason): Pt requiring mod verbal cues for correct hand placement with RW and Min A to power up and maintain balance once in standing Toileting- Clothing Manipulation and Hygiene: Minimal assistance Toileting - Clothing Manipulation Details (indicate cue type and reason): Min A to maintain balance while pulling pants over hips, min verbal cues to hold onto RW with one hand and manipulate pants with other.     Functional mobility during ADLs: Minimal assistance;Cueing for safety;Rolling walker General ADL Comments: Pt with decreased functional strength, balance, and ROM. Pt educated on compensatory strategies to adhere to precuations during ADLs and functional mobility.     Vision Baseline Vision/History: 1 Wears glasses Ability to See in Adequate Light: 0 Adequate Patient Visual Report: No change from baseline       Perception     Praxis      Pertinent Vitals/Pain Pain Assessment: Faces Faces Pain Scale: No hurt Pain Intervention(s): Monitored during session     Hand Dominance     Extremity/Trunk Assessment Upper Extremity Assessment Upper Extremity Assessment: Generalized weakness   Lower Extremity Assessment Lower Extremity Assessment: Defer to PT evaluation   Cervical / Trunk Assessment Cervical / Trunk Assessment: Other exceptions Cervical / Trunk Exceptions: s/p Lumbar fusion   Communication Communication Communication: No difficulties   Cognition Arousal/Alertness: Awake/alert Behavior During Therapy: WFL for tasks assessed/performed Overall Cognitive Status: Within Functional Limits for tasks assessed                                 General Comments: Pt needing increased time to follow commands and complete ADLs.   General Comments  Son and wife present througout.    Exercises     Shoulder Instructions      Home Living Family/patient expects to be discharged to:: Other  (Comment) Living Arrangements: Spouse/significant other Available Help at Discharge: Family Type of Home: House Home Access: Level entry     Home Layout: Able to live on main level with bedroom/bathroom     Bathroom Shower/Tub: Walk-in shower         Home Equipment: Environmental consultant - 2 wheels;Shower seat;Bedside commode;Cane - quad;Grab bars - tub/shower   Additional Comments: Independent living facility      Prior Functioning/Environment Level of Independence: Independent with assistive device(s)        Comments: Pt reports Independent living/ALF performs cooking and cleaning. Pt was still driving and used quad cane for functional mobility.        OT Problem List: Decreased strength;Decreased range of motion;Impaired balance (sitting and/or standing);Decreased safety awareness;Decreased knowledge of use of DME or AE;Decreased knowledge of precautions      OT Treatment/Interventions:      OT Goals(Current goals can be found in the care plan section) Acute Rehab OT Goals Patient Stated Goal: To get back home OT Goal Formulation: With patient  OT Frequency:     Barriers to D/C:            Co-evaluation              AM-PAC OT "6 Clicks" Daily Activity     Outcome Measure Help from another person eating meals?: None Help from another person taking care of personal grooming?: A Little Help from another person toileting, which includes using toliet, bedpan, or urinal?: A Little Help from another  person bathing (including washing, rinsing, drying)?: A Little Help from another person to put on and taking off regular upper body clothing?: A Little Help from another person to put on and taking off regular lower body clothing?: A Little 6 Click Score: 19   End of Session Equipment Utilized During Treatment: Gait belt;Rolling walker;Back brace Nurse Communication: Mobility status  Activity Tolerance: Patient tolerated treatment well Patient left: in chair;with  family/visitor present  OT Visit Diagnosis: Muscle weakness (generalized) (M62.81);Unsteadiness on feet (R26.81);Other abnormalities of gait and mobility (R26.89)                Time: 3736-6815 OT Time Calculation (min): 46 min Charges:  OT General Charges $OT Visit: 1 Visit OT Evaluation $OT Eval Low Complexity: 1 Low OT Treatments $Self Care/Home Management : 23-37 mins  Jackquline Denmark, OTS Acute Rehab Office: [phone]   Joseph [name] 05/07/2021, 11:15 AM

## 2021-05-07 NOTE — Plan of Care (Signed)
  Problem: Education: Goal: Ability to verbalize activity precautions or restrictions will improve Outcome: Completed/Met Goal: Knowledge of the prescribed therapeutic regimen will improve Outcome: Completed/Met Goal: Understanding of discharge needs will improve Outcome: Completed/Met   Problem: Activity: Goal: Will remain free from falls Outcome: Completed/Met   Problem: Bowel/Gastric: Goal: Gastrointestinal status for postoperative course will improve Outcome: Completed/Met   Problem: Clinical Measurements: Goal: Ability to maintain clinical measurements within normal limits will improve Outcome: Completed/Met Goal: Postoperative complications will be avoided or minimized Outcome: Completed/Met Goal: Diagnostic test results will improve Outcome: Completed/Met   Problem: Health Behavior/Discharge Planning: Goal: Identification of resources available to assist in meeting health care needs will improve Outcome: Completed/Met

## 2021-05-08 NOTE — Discharge Summary (Signed)
Patient ID: Joseph [name] MRN: [id number] DOB/AGE: 1935/08/30 85 y.o.  Admit date: 05/06/2021 Discharge date: 05/07/2021  Admission Diagnoses:  Active Problems:   Spinal stenosis   Discharge Diagnoses:  Active Problems:   Spinal stenosis  status post Procedure(s): Lumbar decompression and insitu fusion Lumbar four - Lumbar five  Past Medical History:  Diagnosis Date   Arthritis    Atrial fibrillation ([place])    resolved with cardioversion   Complication of anesthesia    Dysrhythmia    Atrial Fibrilation   GERD (gastroesophageal reflux disease)    Headache    occasional   History of skin cancer    Hypertension    Pneumonia    PONV (postoperative nausea and vomiting)    Prostate cancer ([place])    Sigmoid diverticulitis April of 2012   with small perforation   Squamous cell carcinoma     Surgeries: Procedure(s): Lumbar decompression and insitu fusion Lumbar four - Lumbar five on 05/06/2021   Consultants:   Discharged Condition: Improved  Hospital Course: Joseph [name] is an 85 y.o. male who was admitted 05/06/2021 for operative treatment of Lumbar spinal stenosis with spondylothersis. Patient failed conservative treatments (please see the history and physical for the specifics) and had severe unremitting pain that affects sleep, daily activities and work/hobbies. After pre-op clearance, the patient was taken to the operating room on 05/06/2021 and underwent  Procedure(s): Lumbar decompression and insitu fusion Lumbar four - Lumbar five.    Patient was given perioperative antibiotics:  Anti-infectives (From admission, onward)    Start     Dose/Rate Route Frequency Ordered Stop   05/06/21 2000  ceFAZolin (ANCEF) IVPB 1 g/50 mL premix        1 g 100 mL/hr over 30 Minutes Intravenous Every 8 hours 05/06/21 1441 05/07/21 0336   05/06/21 0926  ceFAZolin (ANCEF) 2-4 GM/100ML-% IVPB       Note to Pharmacy: Mendel Corning   : cabinet override      05/06/21 0926 05/06/21 1114    05/06/21 0919  ceFAZolin (ANCEF) IVPB 2g/100 mL premix        2 g 200 mL/hr over 30 Minutes Intravenous 30 min pre-op 05/06/21 0919 05/06/21 1113        Patient was given sequential compression devices and early ambulation to prevent DVT.   Patient benefited maximally from hospital stay and there were no complications. At the time of discharge, the patient was urinating/moving their bowels without difficulty, tolerating a regular diet, pain is controlled with oral pain medications and they have been cleared by PT/OT.   Recent vital signs: No data found.   Recent laboratory studies: No results for input(s): WBC, HGB, HCT, PLT, NA, K, CL, CO2, BUN, CREATININE, GLUCOSE, INR, CALCIUM in the last 72 hours.  Invalid input(s): PT, 2   Discharge Medications:   Allergies as of 05/07/2021       Reactions   Tetracycline Hcl Rash        Medication List     STOP taking these medications    acetaminophen 650 MG CR tablet Commonly known as: TYLENOL   diclofenac Sodium 1 % Gel Commonly known as: VOLTAREN   Salonpas 3.08-14-08 % Ptch Generic drug: Camphor-Menthol-Methyl Sal   [place] 4 % Crea Generic drug: Fluorouracil   triamcinolone cream 0.1 % Commonly known as: KENALOG   TURMERIC CURCUMIN PO       TAKE these medications    ketotifen 0.025 % ophthalmic solution Commonly known as:  ZADITOR Place 1 drop into both eyes daily as needed (itchy eyes).   methocarbamol 500 MG tablet Commonly known as: Robaxin Take 1 tablet (500 mg total) by mouth every 8 (eight) hours as needed for up to 5 days for muscle spasms.   metoprolol tartrate 25 MG tablet Commonly known as: LOPRESSOR TAKE ONE TABLET BY MOUTH TWICE DAILY   omeprazole 20 MG capsule Commonly known as: PRILOSEC Take 20 mg by mouth in the morning.   ondansetron 4 MG tablet Commonly known as: Zofran Take 1 tablet (4 mg total) by mouth every 8 (eight) hours as needed for nausea or vomiting.   oxyCODONE-acetaminophen  10-325 MG tablet Commonly known as: Percocet Take 1 tablet by mouth every 6 (six) hours as needed for up to 5 days for pain.   Pradaxa 150 MG Caps capsule Generic drug: dabigatran TAKE ONE CAPSULE BY MOUTH TWICE DAILY        Diagnostic Studies: DG Lumbar Spine 2-3 Views  Result Date: 05/06/2021 CLINICAL DATA:  Localization films EXAM: LUMBAR SPINE - 1 VIEW COMPARISON:  None. FINDINGS: Two images are submitted. On one of the images two instruments are seen in the posterior elements at the level of the L4 and L5 vertebral bodies. Image acquired at a later time demonstrates a single instrument in the posterior elements at the level of the L4-L5 intervertebral disc space. No acute osseous abnormality.  Soft tissues are unremarkable. IMPRESSION: Instruments are seen at L4-L5. Electronically Signed   By: Yetta Glassman M.D.   On: 05/06/2021 11:44   DG Lumbar Spine 1 View  Result Date: 05/06/2021 CLINICAL DATA:  Mid back surgery, localization EXAM: LUMBAR SPINE - 1 VIEW COMPARISON:  None. FINDINGS: Single cross-table lateral x-ray of the lumbar spine. Two metallic instruments projecting along the posterior margin of the L4-5 vertebral bodies. Degenerative disease with disc height loss at L3-4, L4-5 and L5-S1. IMPRESSION: Intraoperative localization. Electronically Signed   By: Kathreen Devoid M.D.   On: 05/06/2021 19:02    Discharge Instructions     Incentive spirometry RT   Complete by: As directed         Follow-up Information     Melina Schools, MD Follow up in 2 week(s).   Specialty: Orthopedic Surgery Why: If symptoms worsen, For suture removal, For wound re-check Contact information: [address] [place] [address] [phone]                 Discharge Plan:  discharge to home  Disposition: stable    Signed: Charlyne Petrin for [facility] PA-C [facility] [phone] 05/08/2021, 4:42 PM

## 2021-05-10 NOTE — Progress Notes (Signed)
Cardiology Office Note   Date:  05/11/2021   ID:  Joseph [name], DOB 08-Jul-1936, MRN [id number]  PCP:  [name], MD  Cardiologist:   [name], MD   Chief Complaint  Patient presents with   Atrial Fibrillation       History of Present Illness: Joseph [name] is a 85 y.o. male who presents for follow up of atrial fib.  He had cardioversion in 2012 when he developed atrial fibrillation at the time of ruptured diverticulum. However, he had another recurrence of this in 2017.    He has been in chronic atrial fib.   He had back surgery since I last saw him.  This was just last week.  He is getting around slowly because of this.  The patient denies any new symptoms such as chest discomfort, neck or arm discomfort. There has been no new shortness of breath, PND or orthopnea. There have been no reported palpitations, presyncope or syncope.  His 1 complaint is nausea and heartburn which she ascribes to his Pradaxa.   Past Medical History:  Diagnosis Date   Arthritis    Atrial fibrillation ([place])    resolved with cardioversion   Complication of anesthesia    Dysrhythmia    Atrial Fibrilation   GERD (gastroesophageal reflux disease)    Headache    occasional   History of skin cancer    Hypertension    Pneumonia    PONV (postoperative nausea and vomiting)    Prostate cancer ([place])    Sigmoid diverticulitis April of 2012   with small perforation   Squamous cell carcinoma     Past Surgical History:  Procedure Laterality Date   CARDIOVERSION  01/07/2009   LASIK     LUMBAR LAMINECTOMY/DECOMPRESSION MICRODISCECTOMY N/A 05/06/2021   Procedure: Lumbar decompression and insitu fusion Lumbar four - Lumbar five;  Surgeon: Melina Schools, MD;  Location: [facility];  Service: Orthopedics;  Laterality: N/A;  3 hrs 3 C-Bed   LYMPHADENECTOMY Bilateral 01/17/2015   Procedure: PELVIC LYMPHADENECTOMY;  Surgeon: Alexis Frock, MD;  Location: [facility];  Service: Urology;  Laterality: Bilateral;    NASAL SEPTUM SURGERY     ROBOT ASSISTED LAPAROSCOPIC RADICAL PROSTATECTOMY N/A 01/17/2015   Procedure: ROBOTIC ASSISTED LAPAROSCOPIC RADICAL PROSTATECTOMY WITH INDOCYANINE GREEN DYE INJECTION;  Surgeon: Alexis Frock, MD;  Location: [facility];  Service: Urology;  Laterality: N/A;   SHOULDER SURGERY     x 2   TONSILLECTOMY     as a child     Current Outpatient Medications  Medication Sig Dispense Refill   apixaban (ELIQUIS) 5 MG TABS tablet Take 1 tablet (5 mg total) by mouth 2 (two) times daily. 180 tablet 3   ketotifen (ZADITOR) 0.025 % ophthalmic solution Place 1 drop into both eyes daily as needed (itchy eyes).     methocarbamol (ROBAXIN) 500 MG tablet Take 1 tablet (500 mg total) by mouth every 8 (eight) hours as needed for up to 5 days for muscle spasms. 15 tablet 0   metoprolol tartrate (LOPRESSOR) 25 MG tablet TAKE ONE TABLET BY MOUTH TWICE DAILY 180 tablet 2   omeprazole (PRILOSEC) 20 MG capsule Take 20 mg by mouth in the morning.     ondansetron (ZOFRAN) 4 MG tablet Take 1 tablet (4 mg total) by mouth every 8 (eight) hours as needed for nausea or vomiting. 20 tablet 0   oxyCODONE-acetaminophen (PERCOCET) 10-325 MG tablet Take 1 tablet by mouth every 6 (six) hours as needed  for up to 5 days for pain. 20 tablet 0   No current facility-administered medications for this visit.    Allergies:   Tetracycline hcl    ROS:  Please see the history of present illness.   Otherwise, review of systems are positive for none.   All other systems are reviewed and negative.    PHYSICAL EXAM: VS:  BP 115/80   Pulse 93   Ht 5\' 6"  (1.676 m)   Wt 167 lb 9.6 oz (76 kg)   SpO2 96%   BMI 27.05 kg/m  , BMI Body mass index is 27.05 kg/m.  GENERAL:  Well appearing NECK:  No jugular venous distention, waveform within normal limits, carotid upstroke brisk and symmetric, no bruits, no thyromegaly LUNGS:  Clear to auscultation bilaterally CHEST:  Unremarkable HEART:  PMI not displaced or sustained,S1  and S2 within normal limits, no S3, no clicks, no rubs, no murmurs, irregular  ABD:  Flat, positive bowel sounds normal in frequency in pitch, no bruits, no rebound, no guarding, no midline pulsatile mass, no hepatomegaly, no splenomegaly EXT:  2 plus pulses throughout, no edema, no cyanosis no clubbing   EKG:  EKG is   ordered today.  Atrial fib, rate 93, axis within normal limits, intervals within normal limits, no acute ST-T wave changes.  05/11/2021  Recent Labs: 04/28/2021: BUN 12; Creatinine, Ser 1.36; Hemoglobin 15.8; Platelets 219; Potassium 4.1; Sodium 139    Lipid Panel No results found for: CHOL, TRIG, HDL, CHOLHDL, VLDL, LDLCALC, LDLDIRECT    Wt Readings from Last 3 Encounters:  05/11/21 167 lb 9.6 oz (76 kg)  05/06/21 159 lb 3.2 oz (72.2 kg)  04/28/21 164 lb (74.4 kg)      Other studies Reviewed: Additional studies/ records that were reviewed today include: Labs Review of the above records demonstrates:   See elsewhere  ASSESSMENT AND PLAN:   ATRIAL FIB:     He tolerates anticoagulation. . Joseph [name] has a CHA2DS2 - VASc score of 2.  He has a lot of GI complaints with some heartburn.  He wondered if it was a beta-blocker versus the Pradaxa.  It is most likely the Pradaxa and I would suggest switching to Eliquis 5 mg twice daily.  His creatinine is marginal at 1.36 and I will follow this in a month to make sure that he does not need to reduce dose.  His rate is controlled.  We talked about following that.  CKD II:  Creat is mildly elevated.  Follow-up as above   Current medicines are reviewed at length with the patient today.  The patient does not have concerns regarding medicines.  The following changes have been made: As above  Labs/ tests ordered today include:   8 minutes were spent okay  Orders Placed This Encounter  Procedures   Basic metabolic panel   EKG 99-HFSF      Disposition:   FU with 12 months.     Signed, [name], MD   05/11/2021 9:26 AM    [facility]

## 2021-05-11 ENCOUNTER — Encounter: Payer: Self-pay | Admitting: Cardiology

## 2021-05-11 ENCOUNTER — Other Ambulatory Visit: Payer: Self-pay

## 2021-05-11 ENCOUNTER — Telehealth: Payer: Self-pay | Admitting: Cardiology

## 2021-05-11 ENCOUNTER — Ambulatory Visit: Payer: Medicare Other | Admitting: Cardiology

## 2021-05-11 VITALS — BP 115/80 | HR 93 | Ht 66.0 in | Wt 167.6 lb

## 2021-05-11 DIAGNOSIS — I4891 Unspecified atrial fibrillation: Secondary | ICD-10-CM

## 2021-05-11 DIAGNOSIS — N179 Acute kidney failure, unspecified: Secondary | ICD-10-CM | POA: Insufficient documentation

## 2021-05-11 DIAGNOSIS — Z79899 Other long term (current) drug therapy: Secondary | ICD-10-CM | POA: Diagnosis not present

## 2021-05-11 DIAGNOSIS — N182 Chronic kidney disease, stage 2 (mild): Secondary | ICD-10-CM

## 2021-05-11 DIAGNOSIS — N1831 Chronic kidney disease, stage 3a: Secondary | ICD-10-CM | POA: Insufficient documentation

## 2021-05-11 MED ORDER — APIXABAN 5 MG PO TABS
5.0000 mg | ORAL_TABLET | Freq: Two times a day (BID) | ORAL | 3 refills | Status: DC
Start: 2021-05-11 — End: 2022-03-12

## 2021-05-11 MED ORDER — APIXABAN 5 MG PO TABS: 5.0000 mg | ORAL_TABLET | Freq: Two times a day (BID) | ORAL | 3 refills | Status: DC

## 2021-05-11 NOTE — Telephone Encounter (Signed)
New Message:     Patient said Dr Percival Spanish changed him today from Pradaxa to Eliquis and was calling it in. His wife is at the pharmacy, but it is not there Please check on this andcall this in for him.   *STAT* If patient is at the pharmacy, call can be transferred to refill team.   1. Which medications need to be refilled? (please list name of each medication and dose if known)  new prescription for Eliquis  2. Which pharmacy/location (including street and city if local pharmacy) is medication to be sent to? [facility]  3. Do they need a 30 day or 90 day supply? 90 days and refills

## 2021-05-11 NOTE — Patient Instructions (Signed)
Medication Instructions:  Stop: Pradaxa 150 mg Start: Eliquis 5 mg twice a day  *If you need a refill on your cardiac medications before your next appointment, please call your pharmacy*   Lab Work: Your physician recommends that you return for lab work in 1 month (BMP).  If you have labs (blood work) drawn today and your tests are completely normal, you will receive your results only by: [place] (if you have MyChart) OR A paper copy in the mail If you have any lab test that is abnormal or we need to change your treatment, we will call you to review the results.   Testing/Procedures: None ordered today   Follow-Up: At [facility], you and your health needs are our priority.  As part of our continuing mission to provide you with exceptional heart care, we have created designated Provider Care Teams.  These Care Teams include your primary Cardiologist (physician) and Advanced Practice Providers (APPs -  Physician Assistants and Nurse Practitioners) who all work together to provide you with the care you need, when you need it.  We recommend signing up for the patient portal called "MyChart".  Sign up information is provided on this After Visit Summary.  MyChart is used to connect with patients for Virtual Visits (Telemedicine).  Patients are able to view lab/test results, encounter notes, upcoming appointments, etc.  Non-urgent messages can be sent to your provider as well.   To learn more about what you can do with MyChart, go to [email or web address].    Your next appointment:   1 year(s)  The format for your next appointment:   In Person  Provider:   [name], MD

## 2021-05-11 NOTE — Telephone Encounter (Signed)
Spoke to patient he stated [facility] did not receive Eliquis prescription.Advised prescription was sent in this morning.Advised I will resend to [facility].

## 2021-05-27 ENCOUNTER — Telehealth: Payer: Self-pay | Admitting: Cardiology

## 2021-05-27 NOTE — Telephone Encounter (Signed)
Pt c/o medication issue:  1. Name of Medication: apixaban (ELIQUIS) 5 MG TABS tablet  2. How are you currently taking this medication (dosage and times per day)? Take 1 tablet (5 mg total) by mouth 2 (two) times daily.  3. Are you having a reaction (difficulty breathing--STAT)? no  4. What is your medication issue? Pt has been switched to apixaban (ELIQUIS) 5 MG TABS tablet but has a 90 day unopened supply of PRADAXA 150 MG CAPS capsule... pt would like to know if he should take this medication until it is gone and then continue Eliquis or stop taking altogether... please advise

## 2021-05-27 NOTE — Telephone Encounter (Signed)
Will forward his note to the Pharm D for assistance:   His note form Dr. Percival Spanish: (05/11/21)  ATRIAL FIB:     He tolerates anticoagulation. . Mr. Joseph [name] has a CHA2DS2 - VASc score of 2.  He has a lot of GI complaints with some heartburn.  He wondered if it was a beta-blocker versus the Pradaxa.  It is most likely the Pradaxa and I would suggest switching to Eliquis 5 mg twice daily.  His creatinine is marginal at 1.36 and I will follow this in a month to make sure that he does not need to reduce dose.  His rate is controlled.  We talked about following that.

## 2021-05-27 NOTE — Telephone Encounter (Signed)
5mg  BID is still the appropriate dose

## 2021-05-27 NOTE — Telephone Encounter (Signed)
Called patient, LVM to advise of message below.

## 2021-08-18 ENCOUNTER — Other Ambulatory Visit: Payer: Self-pay | Admitting: Cardiology

## 2021-09-16 ENCOUNTER — Ambulatory Visit: Payer: Medicare Other | Admitting: Physician Assistant

## 2021-10-07 ENCOUNTER — Encounter: Payer: Self-pay | Admitting: Physician Assistant

## 2021-10-07 ENCOUNTER — Ambulatory Visit: Payer: Medicare PPO | Admitting: Physician Assistant

## 2021-10-07 ENCOUNTER — Other Ambulatory Visit: Payer: Self-pay

## 2021-10-07 DIAGNOSIS — L570 Actinic keratosis: Secondary | ICD-10-CM | POA: Diagnosis not present

## 2021-10-07 DIAGNOSIS — Z1283 Encounter for screening for malignant neoplasm of skin: Secondary | ICD-10-CM | POA: Diagnosis not present

## 2021-10-07 DIAGNOSIS — B356 Tinea cruris: Secondary | ICD-10-CM

## 2021-10-19 ENCOUNTER — Encounter: Payer: Self-pay | Admitting: Physician Assistant

## 2021-10-19 NOTE — Progress Notes (Signed)
? ?  Follow-Up Visit ?  ?Subjective  ?Joseph [name] is a 86 y.o. male who presents for the following: Annual Exam (Patient here today for yearly skin check. Per patient he currently has jock itch x 7 years and he's using Aloe and Veta (antifungal ointment) per patient it does help.). ? ? ?The following portions of the chart were reviewed this encounter and updated as appropriate:  Tobacco  Allergies  Meds  Problems  Med Hx  Surg Hx  Fam Hx   ?  ? ?Objective  ?Well appearing patient in no apparent distress; mood and affect are within normal limits. ? ?A full examination was performed including scalp, head, eyes, ears, nose, lips, neck, chest, axillae, abdomen, back, buttocks, bilateral upper extremities, bilateral lower extremities, hands, feet, fingers, toes, fingernails, and toenails. All findings within normal limits unless otherwise noted below. ? ?Abdomen (Lower Torso, Anterior), Head - Anterior (Face), Right Upper Vermilion Lip ?Erythematous patches with gritty scale. ? ? ?Assessment & Plan  ?AK (actinic keratosis) (3) ?Head - Anterior (Face); Abdomen (Lower Torso, Anterior); Right Upper Vermilion Lip ? ?Destruction of lesion - Abdomen (Lower Torso, Anterior), Head - Anterior (Face), Right Upper Vermilion Lip ?Complexity: simple   ?Destruction method: cryotherapy   ?Informed consent: discussed and consent obtained   ?Timeout:  patient name, date of birth, surgical site, and procedure verified ?Lesion destroyed using liquid nitrogen: Yes   ?Cryotherapy cycles:  1 ?Outcome: patient tolerated procedure well with no complications   ?Post-procedure details: wound care instructions given   ? ?No atypical nevi noted at the time of the visit. ? ?I, Dolphus Linch, PA-C, have reviewed all documentation's for this visit.  The documentation on 10/19/21 for the exam, diagnosis, procedures and orders are all accurate and complete. ?

## 2021-11-12 ENCOUNTER — Other Ambulatory Visit: Payer: Self-pay | Admitting: Cardiology

## 2021-11-30 DIAGNOSIS — M7581 Other shoulder lesions, right shoulder: Secondary | ICD-10-CM | POA: Diagnosis not present

## 2021-12-09 DIAGNOSIS — N529 Male erectile dysfunction, unspecified: Secondary | ICD-10-CM | POA: Diagnosis not present

## 2021-12-09 DIAGNOSIS — Z7901 Long term (current) use of anticoagulants: Secondary | ICD-10-CM | POA: Diagnosis not present

## 2021-12-09 DIAGNOSIS — I4891 Unspecified atrial fibrillation: Secondary | ICD-10-CM | POA: Diagnosis not present

## 2021-12-09 DIAGNOSIS — I951 Orthostatic hypotension: Secondary | ICD-10-CM | POA: Diagnosis not present

## 2021-12-09 DIAGNOSIS — M1990 Unspecified osteoarthritis, unspecified site: Secondary | ICD-10-CM | POA: Diagnosis not present

## 2021-12-09 DIAGNOSIS — J309 Allergic rhinitis, unspecified: Secondary | ICD-10-CM | POA: Diagnosis not present

## 2021-12-09 DIAGNOSIS — R32 Unspecified urinary incontinence: Secondary | ICD-10-CM | POA: Diagnosis not present

## 2021-12-09 DIAGNOSIS — I10 Essential (primary) hypertension: Secondary | ICD-10-CM | POA: Diagnosis not present

## 2021-12-09 DIAGNOSIS — D6869 Other thrombophilia: Secondary | ICD-10-CM | POA: Diagnosis not present

## 2021-12-31 ENCOUNTER — Encounter (HOSPITAL_BASED_OUTPATIENT_CLINIC_OR_DEPARTMENT_OTHER): Payer: Self-pay

## 2021-12-31 ENCOUNTER — Inpatient Hospital Stay (HOSPITAL_BASED_OUTPATIENT_CLINIC_OR_DEPARTMENT_OTHER)
Admission: EM | Admit: 2021-12-31 | Discharge: 2022-01-02 | DRG: 194 | Disposition: A | Discharge status: Home / Self Care | Payer: Medicare PPO | Attending: Student | Admitting: Student

## 2021-12-31 ENCOUNTER — Other Ambulatory Visit: Payer: Self-pay

## 2021-12-31 ENCOUNTER — Emergency Department (HOSPITAL_BASED_OUTPATIENT_CLINIC_OR_DEPARTMENT_OTHER): Payer: Medicare PPO

## 2021-12-31 DIAGNOSIS — Z8546 Personal history of malignant neoplasm of prostate: Secondary | ICD-10-CM

## 2021-12-31 DIAGNOSIS — N1831 Chronic kidney disease, stage 3a: Secondary | ICD-10-CM | POA: Diagnosis present

## 2021-12-31 DIAGNOSIS — Z85828 Personal history of other malignant neoplasm of skin: Secondary | ICD-10-CM | POA: Diagnosis not present

## 2021-12-31 DIAGNOSIS — D696 Thrombocytopenia, unspecified: Secondary | ICD-10-CM | POA: Diagnosis present

## 2021-12-31 DIAGNOSIS — E860 Dehydration: Secondary | ICD-10-CM | POA: Diagnosis present

## 2021-12-31 DIAGNOSIS — Z20822 Contact with and (suspected) exposure to covid-19: Secondary | ICD-10-CM | POA: Diagnosis present

## 2021-12-31 DIAGNOSIS — R531 Weakness: Secondary | ICD-10-CM

## 2021-12-31 DIAGNOSIS — Z888 Allergy status to other drugs, medicaments and biological substances status: Secondary | ICD-10-CM

## 2021-12-31 DIAGNOSIS — Z79899 Other long term (current) drug therapy: Secondary | ICD-10-CM

## 2021-12-31 DIAGNOSIS — M48061 Spinal stenosis, lumbar region without neurogenic claudication: Secondary | ICD-10-CM

## 2021-12-31 DIAGNOSIS — K219 Gastro-esophageal reflux disease without esophagitis: Secondary | ICD-10-CM | POA: Diagnosis present

## 2021-12-31 DIAGNOSIS — J189 Pneumonia, unspecified organism: Secondary | ICD-10-CM | POA: Diagnosis present

## 2021-12-31 DIAGNOSIS — I4891 Unspecified atrial fibrillation: Secondary | ICD-10-CM | POA: Diagnosis present

## 2021-12-31 DIAGNOSIS — I129 Hypertensive chronic kidney disease with stage 1 through stage 4 chronic kidney disease, or unspecified chronic kidney disease: Secondary | ICD-10-CM | POA: Diagnosis present

## 2021-12-31 DIAGNOSIS — Z7901 Long term (current) use of anticoagulants: Secondary | ICD-10-CM | POA: Diagnosis not present

## 2021-12-31 DIAGNOSIS — I480 Paroxysmal atrial fibrillation: Secondary | ICD-10-CM | POA: Diagnosis present

## 2021-12-31 DIAGNOSIS — M4800 Spinal stenosis, site unspecified: Secondary | ICD-10-CM | POA: Diagnosis present

## 2021-12-31 DIAGNOSIS — Z66 Do not resuscitate: Secondary | ICD-10-CM | POA: Diagnosis present

## 2021-12-31 DIAGNOSIS — E876 Hypokalemia: Secondary | ICD-10-CM

## 2021-12-31 DIAGNOSIS — N179 Acute kidney failure, unspecified: Secondary | ICD-10-CM | POA: Diagnosis present

## 2021-12-31 DIAGNOSIS — R918 Other nonspecific abnormal finding of lung field: Secondary | ICD-10-CM | POA: Diagnosis not present

## 2021-12-31 LAB — CBC WITH DIFFERENTIAL/PLATELET
Abs Immature Granulocytes: 0.05 10*3/uL (ref 0.00–0.07)
Basophils Absolute: 0 10*3/uL (ref 0.0–0.1)
Basophils Relative: 0 %
Eosinophils Absolute: 0 10*3/uL (ref 0.0–0.5)
Eosinophils Relative: 0 %
HCT: 51 % (ref 39.0–52.0)
Hemoglobin: 17.4 g/dL — ABNORMAL HIGH (ref 13.0–17.0)
Immature Granulocytes: 1 %
Lymphocytes Relative: 10 %
Lymphs Abs: 0.8 10*3/uL (ref 0.7–4.0)
MCH: 34.2 pg — ABNORMAL HIGH (ref 26.0–34.0)
MCHC: 34.1 g/dL (ref 30.0–36.0)
MCV: 100.2 fL — ABNORMAL HIGH (ref 80.0–100.0)
Monocytes Absolute: 0.8 10*3/uL (ref 0.1–1.0)
Monocytes Relative: 10 %
Neutro Abs: 6.3 10*3/uL (ref 1.7–7.7)
Neutrophils Relative %: 79 %
Platelets: 162 10*3/uL (ref 150–400)
RBC: 5.09 MIL/uL (ref 4.22–5.81)
RDW: 13.2 % (ref 11.5–15.5)
WBC: 8 10*3/uL (ref 4.0–10.5)
nRBC: 0 % (ref 0.0–0.2)

## 2021-12-31 LAB — PROTIME-INR
INR: 1.2 (ref 0.8–1.2)
Prothrombin Time: 15 seconds (ref 11.4–15.2)

## 2021-12-31 LAB — HEPATIC FUNCTION PANEL
ALT: 14 U/L (ref 0–44)
AST: 20 U/L (ref 15–41)
Albumin: 4.3 g/dL (ref 3.5–5.0)
Alkaline Phosphatase: 57 U/L (ref 38–126)
Bilirubin, Direct: 0.2 mg/dL (ref 0.0–0.2)
Indirect Bilirubin: 0.4 mg/dL (ref 0.3–0.9)
Total Bilirubin: 0.6 mg/dL (ref 0.3–1.2)
Total Protein: 7.6 g/dL (ref 6.5–8.1)

## 2021-12-31 LAB — BASIC METABOLIC PANEL
Anion gap: 13 (ref 5–15)
BUN: 28 mg/dL — ABNORMAL HIGH (ref 8–23)
CO2: 22 mmol/L (ref 22–32)
Calcium: 9.6 mg/dL (ref 8.9–10.3)
Chloride: 100 mmol/L (ref 98–111)
Creatinine, Ser: 1.31 mg/dL — ABNORMAL HIGH (ref 0.61–1.24)
GFR, Estimated: 54 mL/min — ABNORMAL LOW (ref >60)
Glucose, Bld: 126 mg/dL — ABNORMAL HIGH (ref 70–99)
Potassium: 4.5 mmol/L (ref 3.5–5.1)
Sodium: 135 mmol/L (ref 135–145)

## 2021-12-31 LAB — URINALYSIS, ROUTINE W REFLEX MICROSCOPIC
Bilirubin Urine: NEGATIVE
Glucose, UA: NEGATIVE mg/dL
Ketones, ur: 40 mg/dL — AB
Leukocytes,Ua: NEGATIVE
Nitrite: NEGATIVE
Protein, ur: 100 mg/dL — AB
Specific Gravity, Urine: 1.038 — ABNORMAL HIGH (ref 1.005–1.030)
pH: 5.5 (ref 5.0–8.0)

## 2021-12-31 LAB — CBC
HCT: 51.7 % (ref 39.0–52.0)
Hemoglobin: 17.4 g/dL — ABNORMAL HIGH (ref 13.0–17.0)
MCH: 33.6 pg (ref 26.0–34.0)
MCHC: 33.7 g/dL (ref 30.0–36.0)
MCV: 99.8 fL (ref 80.0–100.0)
Platelets: 157 10*3/uL (ref 150–400)
RBC: 5.18 MIL/uL (ref 4.22–5.81)
RDW: 13.2 % (ref 11.5–15.5)
WBC: 8.1 10*3/uL (ref 4.0–10.5)
nRBC: 0 % (ref 0.0–0.2)

## 2021-12-31 LAB — LACTIC ACID, PLASMA
Lactic Acid, Venous: 1.5 mmol/L (ref 0.5–1.9)
Lactic Acid, Venous: 1.3 mmol/L (ref 0.5–1.9)

## 2021-12-31 LAB — TSH: TSH: 1.419 u[IU]/mL (ref 0.350–4.500)

## 2021-12-31 LAB — RESP PANEL BY RT-PCR (FLU A&B, COVID) ARPGX2
Influenza A by PCR: NEGATIVE
Influenza B by PCR: NEGATIVE
SARS Coronavirus 2 by RT PCR: NEGATIVE

## 2021-12-31 LAB — SARS CORONAVIRUS 2 BY RT PCR: SARS Coronavirus 2 by RT PCR: NEGATIVE

## 2021-12-31 LAB — APTT: aPTT: 32 seconds (ref 24–36)

## 2021-12-31 LAB — PROCALCITONIN: Procalcitonin: 0.15 ng/mL

## 2021-12-31 MED ORDER — GUAIFENESIN ER 600 MG PO TB12
600.0000 mg | ORAL_TABLET | Freq: Two times a day (BID) | ORAL | Status: DC
Start: 2021-12-31 — End: 2022-01-02
  Administered 2021-12-31 – 2022-01-02 (×4): 600 mg via ORAL
  Filled 2021-12-31 (×4): qty 1

## 2021-12-31 MED ORDER — APIXABAN 5 MG PO TABS
5.0000 mg | ORAL_TABLET | Freq: Two times a day (BID) | ORAL | Status: DC
  Administered 2021-12-31 – 2022-01-02 (×4): 5 mg via ORAL
  Filled 2021-12-31 (×4): qty 1

## 2021-12-31 MED ORDER — DILTIAZEM HCL 30 MG PO TABS
30.0000 mg | ORAL_TABLET | Freq: Four times a day (QID) | ORAL | Status: DC
Start: 2021-12-31 — End: 2022-01-01
  Administered 2021-12-31 – 2022-01-01 (×3): 30 mg via ORAL
  Filled 2021-12-31 (×3): qty 1

## 2021-12-31 MED ORDER — SODIUM CHLORIDE 0.9 % IV BOLUS (SEPSIS)
1000.0000 mL | Freq: Once | INTRAVENOUS | Status: AC
  Administered 2021-12-31: 1000 mL via INTRAVENOUS

## 2021-12-31 MED ORDER — POLYETHYLENE GLYCOL 3350 17 G PO PACK: 17.0000 g | PACK | Freq: Two times a day (BID) | ORAL | Status: DC | PRN

## 2021-12-31 MED ORDER — KETOTIFEN FUMARATE 0.025 % OP SOLN: 1.0000 [drp] | Freq: Every day | OPHTHALMIC | Status: DC | PRN

## 2021-12-31 MED ORDER — SENNOSIDES-DOCUSATE SODIUM 8.6-50 MG PO TABS
1.0000 | ORAL_TABLET | Freq: Two times a day (BID) | ORAL | Status: DC | PRN
Start: 2021-12-31 — End: 2022-01-02

## 2021-12-31 MED ORDER — SODIUM CHLORIDE 0.9 % IV SOLN
2.0000 g | INTRAVENOUS | Status: DC
  Administered 2021-12-31 – 2022-01-02 (×3): 2 g via INTRAVENOUS
  Filled 2021-12-31 (×3): qty 20

## 2021-12-31 MED ORDER — ACETAMINOPHEN 325 MG PO TABS: 650.0000 mg | ORAL_TABLET | Freq: Four times a day (QID) | ORAL | Status: DC | PRN

## 2021-12-31 MED ORDER — METOPROLOL TARTRATE 5 MG/5ML IV SOLN: 5.0000 mg | Freq: Four times a day (QID) | INTRAVENOUS | Status: DC | PRN

## 2021-12-31 MED ORDER — ACETAMINOPHEN 650 MG RE SUPP: 650.0000 mg | Freq: Four times a day (QID) | RECTAL | Status: DC | PRN

## 2021-12-31 MED ORDER — TRAZODONE HCL 50 MG PO TABS: 25.0000 mg | ORAL_TABLET | Freq: Every evening | ORAL | Status: DC | PRN

## 2021-12-31 MED ORDER — LORATADINE 10 MG PO TABS
10.0000 mg | ORAL_TABLET | Freq: Every day | ORAL | Status: DC
  Administered 2021-12-31 – 2022-01-02 (×3): 10 mg via ORAL
  Filled 2021-12-31 (×3): qty 1

## 2021-12-31 MED ORDER — LACTATED RINGERS IV SOLN: INTRAVENOUS | Status: DC

## 2021-12-31 MED ORDER — SODIUM CHLORIDE 0.9 % IV SOLN
500.0000 mg | INTRAVENOUS | Status: DC
  Administered 2021-12-31 – 2022-01-01 (×2): 500 mg via INTRAVENOUS
  Filled 2021-12-31 (×2): qty 5

## 2021-12-31 MED ORDER — LACTATED RINGERS IV SOLN: INTRAVENOUS | Status: AC

## 2021-12-31 MED ORDER — METOPROLOL TARTRATE 25 MG PO TABS
25.0000 mg | ORAL_TABLET | Freq: Once | ORAL | Status: AC
Start: 2021-12-31 — End: 2021-12-31
  Administered 2021-12-31: 25 mg via ORAL
  Filled 2021-12-31: qty 1

## 2021-12-31 MED ORDER — GUAIFENESIN-DM 100-10 MG/5ML PO SYRP: 5.0000 mL | ORAL_SOLUTION | ORAL | Status: DC | PRN

## 2021-12-31 MED ORDER — LEVALBUTEROL HCL 0.63 MG/3ML IN NEBU: 0.6300 mg | INHALATION_SOLUTION | Freq: Four times a day (QID) | RESPIRATORY_TRACT | Status: DC | PRN

## 2021-12-31 NOTE — H&P (Addendum)
History and Physical    Joseph [name] VZD:[id number] DOB: Aug 20, 1935 DOA: 12/31/2021  PCP: [name], MD Patient coming from: ILF.  Chief Complaint: Generalized weakness  HPI: 86 year old M with PMH of A-fib on Eliquis, lumbar spinal stenosis status post decompression laminectomy 04/2021 and prostate cancer s/p prostatectomy in 2016 presenting with generalized weakness, poor p.o. intake and productive cough.  Patient reports generalized weakness for 4 days.  Started on Monday when he struggles to get out of the bed.  After he finally managed to get out and went to the bathroom, he could not get back on the bed.  This continued for the last 4 days without significant improvement.  Eventually, visiting nurse at [facility] advised to go to ED for evaluation.  He also reports poor p.o. intake over the same period of time.  He reports productive cough with grayish phlegm for 4 weeks.  He admits to dyspnea on exertion.  He denies chest pain, runny nose, sore throat, fever, chills, nausea, vomiting, abdominal pain, dizziness, palpitation, orthopnea, PND or edema.  Denies bowel or bladder habit change.  Denies new focal neuro symptoms.  Denies changes to his medication.  He reports good compliance with his metoprolol and Eliquis.  Last dose of this morning.  He denies new medication other than the Augmentin he was started on by the nurse yesterday.  He took 1 dose yesterday.   Patient lives at [facility].  Denies ever smoking cigarettes.  Admits to social alcohol.  Denies recreational drug use.  He thinks DNR/DNI is appropriate.  In ED, tachycardic to 130s but improved to 102 after p.o. metoprolol and IV fluid.  Mild temp to 99.3.  Otherwise, vital stable.  Cr 1.34 (about baseline).  BUN 28.  Hgb 17.4.  UA with 40 ketonuria and increased gravity suggesting dehydration.  CXR concerning for LLL infiltrate.  COVID-19 and influenza PCR nonreactive.  EKG features A-fib with RVR at a rate of 130s.  Blood cultures drawn.   Received metoprolol 25 mg, azithromycin, ceftriaxone, NS bolus and admitted for community-acquired pneumonia.   ROS All review of system negative except for pertinent positives and negatives as history of present illness above.  PMH Past Medical History:  Diagnosis Date   Arthritis    Atrial fibrillation ([place])    resolved with cardioversion   Complication of anesthesia    Dysrhythmia    Atrial Fibrilation   GERD (gastroesophageal reflux disease)    Headache    occasional   History of skin cancer    Hypertension    Pneumonia    PONV (postoperative nausea and vomiting)    Prostate cancer ([name])    Sigmoid diverticulitis April of 2012   with small perforation   Squamous cell carcinoma    PSH Past Surgical History:  Procedure Laterality Date   CARDIOVERSION  01/07/2009   LASIK     LUMBAR LAMINECTOMY/DECOMPRESSION MICRODISCECTOMY N/A 05/06/2021   Procedure: Lumbar decompression and insitu fusion Lumbar four - Lumbar five;  Surgeon: Melina Schools, MD;  Location: [facility];  Service: Orthopedics;  Laterality: N/A;  3 hrs 3 C-Bed   LYMPHADENECTOMY Bilateral 01/17/2015   Procedure: PELVIC LYMPHADENECTOMY;  Surgeon: Alexis Frock, MD;  Location: [facility];  Service: Urology;  Laterality: Bilateral;   NASAL SEPTUM SURGERY     ROBOT ASSISTED LAPAROSCOPIC RADICAL PROSTATECTOMY N/A 01/17/2015   Procedure: ROBOTIC ASSISTED LAPAROSCOPIC RADICAL PROSTATECTOMY WITH INDOCYANINE GREEN DYE INJECTION;  Surgeon: Alexis Frock, MD;  Location: [facility];  Service: Urology;  Laterality:  N/A;   SHOULDER SURGERY     x 2   TONSILLECTOMY     as a child   Fam HX Family History  Problem Relation Age of Onset   Other Mother        no neg hx   Other Father        no neg hx    Social Hx  reports that he has never smoked. He has never used smokeless tobacco. He reports current alcohol use. He reports that he does not use drugs.  Allergy Allergies  Allergen Reactions   Poison Ivy Extract Rash    Tetracycline Hcl Rash   Home Meds Prior to Admission medications   Medication Sig Start Date End Date Taking? Authorizing Provider  amoxicillin-clavulanate (AUGMENTIN) 875-125 MG tablet Take 1 tablet by mouth 2 (two) times daily. 12/30/21  Yes [provider]  apixaban (ELIQUIS) 5 MG TABS tablet Take 1 tablet (5 mg total) by mouth 2 (two) times daily. 05/11/21  Yes [name], MD  cetirizine (ZYRTEC) 10 MG tablet Take 10 mg by mouth daily.   Yes [provider]  dextromethorphan-guaiFENesin (MUCINEX DM) 30-600 MG 12hr tablet Take 1 tablet by mouth 2 (two) times daily.   Yes [provider]  fluticasone (FLONASE) 50 MCG/ACT nasal spray Place into both nostrils. 10/01/21   [provider]  ketotifen (ZADITOR) 0.025 % ophthalmic solution Place 1 drop into both eyes daily as needed (itchy eyes).    [provider]  metoprolol tartrate (LOPRESSOR) 25 MG tablet TAKE ONE TABLET BY MOUTH TWICE DAILY Patient not taking: Reported on 12/31/2021 11/12/21   [name], MD    Physical Exam: Vitals:   12/31/21 1400 12/31/21 1500 12/31/21 1509 12/31/21 1559  BP: 125/71 123/80  (!) 136/96  Pulse: (!) 108 94  (!) 102  Resp: (!) 23 (!) 24  20  Temp:   99.3 F (37.4 C) 99.1 F (37.3 C)  TempSrc:   Oral Oral  SpO2: 93%   94%  Weight:      Height:        GENERAL: No acute distress.  Appears well.  HEENT: MMM.  Vision grossly intact.  Diminished hearing. NECK: Supple.  No apparent JVD.  RESP:  No IWOB. Good air movement bilaterally. CVS: Irregular rhythm.  HR in 1 low 100s. Heart sounds normal.  ABD/GI/GU: Bowel sounds present. Soft. Non tender.  MSK/EXT:  Moves extremities. No apparent deformity or edema.  SKIN: no apparent skin lesion or wound NEURO: Awake, alert and oriented appropriately.  No gross deficit.  PSYCH: Calm. Normal affect.   Personally Reviewed Radiological Exams See HPI   Personally Reviewed Labs: CBC: Recent Labs  Lab  12/31/21 1110 12/31/21 1200  WBC 8.1 8.0  NEUTROABS  --  6.3  HGB 17.4* 17.4*  HCT 51.7 51.0  MCV 99.8 100.2*  PLT 157 350   Basic Metabolic Panel: Recent Labs  Lab 12/31/21 1110  NA 135  K 4.5  CL 100  CO2 22  GLUCOSE 126*  BUN 28*  CREATININE 1.31*  CALCIUM 9.6   GFR: Estimated Creatinine Clearance: 37.9 mL/min (A) (by C-G formula based on SCr of 1.31 mg/dL (H)). Liver Function Tests: Recent Labs  Lab 12/31/21 1223  AST 20  ALT 14  ALKPHOS 57  BILITOT 0.6  PROT 7.6  ALBUMIN 4.3   No results for input(s): LIPASE, AMYLASE in the last 168 hours. No results for input(s): AMMONIA in the last 168 hours. Coagulation  Profile: Recent Labs  Lab 12/31/21 1223  INR 1.2   Cardiac Enzymes: No results for input(s): CKTOTAL, CKMB, CKMBINDEX, TROPONINI in the last 168 hours. BNP (last 3 results) No results for input(s): PROBNP in the last 8760 hours. HbA1C: No results for input(s): HGBA1C in the last 72 hours. CBG: No results for input(s): GLUCAP in the last 168 hours. Lipid Profile: No results for input(s): CHOL, HDL, LDLCALC, TRIG, CHOLHDL, LDLDIRECT in the last 72 hours. Thyroid Function Tests: No results for input(s): TSH, T4TOTAL, FREET4, T3FREE, THYROIDAB in the last 72 hours. Anemia Panel: No results for input(s): VITAMINB12, FOLATE, FERRITIN, TIBC, IRON, RETICCTPCT in the last 72 hours. Urine analysis:    Component Value Date/Time   COLORURINE BROWN (A) 12/31/2021 1310   APPEARANCEUR CLEAR 12/31/2021 1310   LABSPEC 1.038 (H) 12/31/2021 1310   PHURINE 5.5 12/31/2021 1310   GLUCOSEU NEGATIVE 12/31/2021 1310   HGBUR MODERATE (A) 12/31/2021 1310   BILIRUBINUR NEGATIVE 12/31/2021 1310   KETONESUR 40 (A) 12/31/2021 1310   PROTEINUR 100 (A) 12/31/2021 1310   UROBILINOGEN 0.2 11/24/2010 1215   NITRITE NEGATIVE 12/31/2021 1310   LEUKOCYTESUR NEGATIVE 12/31/2021 1310    Sepsis Labs:  Lactic acid normal.  Personally Reviewed EKG:  Twelve-lead EKG features  atrial fibrillation with RVR to 130s.  Assessment and Plan: * CAP (community acquired pneumonia) Presents with general malaise, productive cough and poor p.o. intake.  No leukocytosis or fever.  CXR concerning for LLL infiltrate. -Continue IV ceftriaxone and azithromycin -Follow blood cultures -Check procalcitonin -Continue IV fluid  Paroxysmal atrial fibrillation with RVR ([facility]) RVR to 130s on presentation but improved to low 100s.  Could be provoked by pneumonia.  Reports good compliance with Eliquis. -Manage pneumonia as above. -P.o. Cardizem to 30 mg every 6 hours -Hold home metoprolol. -Continue home Eliquis -Optimize electrolytes -Check TSH -Update echocardiogram  Dehydration Has poor p.o. intake, hemoconcentration and ketonuria -Continue IV fluid  Chronic kidney disease, stage 3a ([place]) Recent Labs    04/08/21 1026 04/28/21 1223 12/31/21 1110  BUN 16 12 28*  CREATININE 1.26 1.36* 1.31*  Continue IV fluid Recheck in the morning   Generalized weakness PT/OT eval.  Spinal stenosis S/p decompression and laminectomy in 04/2021. -PT/OT eval   DVT prophylaxis: Patient is on Eliquis.  Code Status: DNR/DNI Family Communication: Updated patient's wife at bedside. Consults called: None Admission status: Observation   Mercy Riding MD [facility]  If 7PM-7AM, please contact night-coverage [email or web address]  12/31/2021, 5:15 PM

## 2021-12-31 NOTE — ED Triage Notes (Signed)
Pt. States he has not been able to stand up. Started Monday and kept getting worse. States nurse told him to come here for possible pneumonia. Denies V/N/D. Deines fever this am.

## 2021-12-31 NOTE — Assessment & Plan Note (Addendum)
Likely provoked by pneumonia.  Reports good compliance with metoprolol and Eliquis.  TSH within normal.  Echocardiogram without significant finding.  RVR resolved. -Increase metoprolol from 25 to 50 mg twice daily -Continue home Eliquis

## 2021-12-31 NOTE — Hospital Course (Addendum)
86 year old M with PMH of A-fib on Eliquis, lumbar spinal stenosis status post decompression laminectomy 04/2021 and prostate cancer s/p prostatectomy in 2016 presenting with generalized weakness, poor p.o. intake and productive cough and admitted for community-acquired pneumonia and A-fib with RVR.  Started on IV fluid, IV ceftriaxone and azithromycin.  RVR resolved with p.o. meds.  Echocardiogram pending.

## 2021-12-31 NOTE — ED Notes (Signed)
[facility] on unit to transport pt to [facility]

## 2021-12-31 NOTE — Sepsis Progress Note (Signed)
Sepsis protocol is being followed by [facility]. 

## 2021-12-31 NOTE — Assessment & Plan Note (Addendum)
Recent Labs    04/08/21 1026 04/28/21 1223 12/31/21 1110 01/01/22 0530 01/02/22 0444  BUN 16 12 28* 20 17  CREATININE 1.26 1.36* 1.31* 0.83 0.96  Initially felt to be CKD but seems like he had an AKI from poor p.o. intake in the setting of pneumonia and A-fib with RVR. -Recheck renal function in 1 to 2 weeks

## 2021-12-31 NOTE — Assessment & Plan Note (Addendum)
Has poor p.o. intake, hemoconcentration and ketonuria.  Resolved with IV fluid hydration

## 2021-12-31 NOTE — Progress Notes (Signed)
Plan of Care Note for accepted transfer   Patient: Joseph [name] MRN: [id number]   DOA: 12/31/2021  Facility requesting transfer: [facility] Requesting Provider: Dr. Kathrynn Humble Reason for transfer: LLL PNA Facility course: 86 yo M with history of a fib. Presenting with weakness and productive cough. Had symptoms at his ILF for last 4 - 5 days. He's required more care than they can give. ED w/u shows CXR w/  LLL PNA. Started on CAP coverage. HR was in 130s when he arrived. About to get oral lopressor. Coming to progressive at [facility].    Plan of care: The patient is accepted for admission to Progressive unit, at [facility].  While holding at [facility], medical decision making for this patient remains with the EDP. Upon arrival to [facility] will assume care.   Author: Jonnie Finner, DO 12/31/2021  Check [email or web address] for on-call coverage.  Nursing staff, Please call [place] number on Amion as soon as patient's arrival, so appropriate admitting provider can evaluate the pt.

## 2021-12-31 NOTE — Assessment & Plan Note (Addendum)
Resolved

## 2021-12-31 NOTE — ED Provider Notes (Signed)
[facility] EMERGENCY DEPT Provider Note   CSN: [id number] Arrival date & time: 12/31/21  1035     History  Chief Complaint  Patient presents with   Weakness    Joseph [name] is a 86 y.o. male.  HPI    86 year old comes in with chief complaint of weakness.  Patient resides at independent living facility and is here with his wife.  He indicates that he has been feeling unwell since Monday.  His symptoms are described as cough with green phlegm along with some shortness of breath and generalized weakness with inability to get around.  He is normally extremely active.  Review of system is positive for subjective fevers and chills, but no fevers today.  Given his increasing weakness, he was advised to come to the ER.  Review of system is negative for any UTI-like symptoms.    Home Medications Prior to Admission medications   Medication Sig Start Date End Date Taking? Authorizing Provider  amoxicillin-clavulanate (AUGMENTIN) 875-125 MG tablet Take 1 tablet by mouth 2 (two) times daily. 12/30/21   [provider]  apixaban (ELIQUIS) 5 MG TABS tablet Take 1 tablet (5 mg total) by mouth 2 (two) times daily. 05/11/21   [name], MD  fluticasone (FLONASE) 50 MCG/ACT nasal spray Place into both nostrils. 10/01/21   [provider]  ketotifen (ZADITOR) 0.025 % ophthalmic solution Place 1 drop into both eyes daily as needed (itchy eyes).    [provider]  metoprolol tartrate (LOPRESSOR) 25 MG tablet TAKE ONE TABLET BY MOUTH TWICE DAILY 11/12/21   [name], MD      Allergies    Poison ivy extract and Tetracycline hcl    Review of Systems   Review of Systems  All other systems reviewed and are negative.  Physical Exam Updated Vital Signs BP 129/83   Pulse (!) 121   Temp 98.4 F (36.9 C) (Oral)   Resp (!) 27   Ht '5\' 6"'$  (1.676 m)   Wt 69.4 kg   SpO2 96%   BMI 24.69 kg/m  Physical Exam Vitals and nursing note reviewed.   Constitutional:      Appearance: He is well-developed.  HENT:     Head: Atraumatic.  Cardiovascular:     Rate and Rhythm: Tachycardia present. Rhythm irregular.  Pulmonary:     Effort: Pulmonary effort is normal.  Musculoskeletal:     Cervical back: Neck supple.     Right lower leg: No edema.     Left lower leg: No edema.  Skin:    General: Skin is warm.  Neurological:     Mental Status: He is alert and oriented to person, place, and time.    ED Results / Procedures / Treatments   Labs (all labs ordered are listed, but only abnormal results are displayed) Labs Reviewed  BASIC METABOLIC PANEL - Abnormal; Notable for the following components:      Result Value   Glucose, Bld 126 (*)    BUN 28 (*)    Creatinine, Ser 1.31 (*)    GFR, Estimated 54 (*)    All other components within normal limits  CBC - Abnormal; Notable for the following components:   Hemoglobin 17.4 (*)    All other components within normal limits  URINALYSIS, ROUTINE W REFLEX MICROSCOPIC - Abnormal; Notable for the following components:   Color, Urine BROWN (*)    Specific Gravity, Urine 1.038 (*)    Hgb urine dipstick MODERATE (*)  Ketones, ur 40 (*)    Protein, ur 100 (*)    All other components within normal limits  CBC WITH DIFFERENTIAL/PLATELET - Abnormal; Notable for the following components:   Hemoglobin 17.4 (*)    MCV 100.2 (*)    MCH 34.2 (*)    All other components within normal limits  SARS CORONAVIRUS 2 BY RT PCR  SARS CORONAVIRUS 2 BY RT PCR  RESP PANEL BY RT-PCR (FLU A&B, COVID) [id number]  CULTURE, BLOOD (ROUTINE X 2)  CULTURE, BLOOD (ROUTINE X 2)  LACTIC ACID, PLASMA  PROTIME-INR  APTT  HEPATIC FUNCTION PANEL  LACTIC ACID, PLASMA    EKG EKG Interpretation  Date/Time:  Thursday Dec 31 2021 11:17:22 EDT Ventricular Rate:  131 PR Interval:    QRS Duration: 76 QT Interval:  297 QTC Calculation: 439 R Axis:   77 Text Interpretation: Atrial fibrillation  with RVR Probable  anterior infarct, old No acute changes No significant change since last tracing Confirmed by Varney Biles [phone]) on 12/31/2021 12:20:30 PM  Radiology DG Chest 1 View  Result Date: 12/31/2021 CLINICAL DATA:  Possible pneumonia EXAM: CHEST  1 VIEW COMPARISON:  11/24/2010 FINDINGS: Left lower lobe airspace disease concerning for atelectasis versus pneumonia. no pleural effusion or pneumothorax. Heart and mediastinal contours are unremarkable. No acute osseous abnormality. IMPRESSION: 1. Left lower lobe airspace disease concerning for atelectasis versus pneumonia. Electronically Signed   By: Kathreen Devoid M.D.   On: 12/31/2021 11:35    Procedures Procedures    Medications Ordered in ED Medications  lactated ringers infusion (has no administration in time range)  cefTRIAXone (ROCEPHIN) 2 g in sodium chloride 0.9 % 100 mL IVPB (0 g Intravenous Stopped 12/31/21 1315)  azithromycin (ZITHROMAX) 500 mg in sodium chloride 0.9 % 250 mL IVPB (500 mg Intravenous New Bag/Given 12/31/21 1317)  lactated ringers infusion (has no administration in time range)  metoprolol tartrate (LOPRESSOR) tablet 25 mg (has no administration in time range)  sodium chloride 0.9 % bolus 1,000 mL (1,000 mLs Intravenous New Bag/Given 12/31/21 1241)    ED Course/ Medical Decision Making/ A&P                           Medical Decision Making Amount and/or Complexity of Data Reviewed Labs: ordered. Radiology: ordered. ECG/medicine tests: ordered.  Risk Prescription drug management. Decision regarding hospitalization.    This patient presents to the ED with chief complaint(s) of generalized weakness, cough, shortness of breath with pertinent past medical history of atrial fibrillation which further complicates the presenting complaint. The complaint involves an extensive differential diagnosis and also carries with it a high risk of complications and morbidity.    The differential diagnosis includes : Community-acquired  pneumonia, COVID-19, CHF, severe anemia. Patient is noted to be in A-fib with RVR.  Heart rate in the 130s and 120s.  Likely reactive to underlying infectious process.  However, A-fib with RVR likely contributing to decompensation from respiratory perspective.  The initial plan is to order basic labs and chest x-ray.  COVID-19 test also ordered.   Additional history obtained: Additional history obtained from spouse Records reviewed  cardiology documents,   Independent labs interpretation:  The following labs were independently interpreted: Hemoconcentration with hemoglobin over 17, ketonuria, BUN elevation with baseline creatinine  Independent visualization of imaging: - I independently visualized the following imaging with scope of interpretation limited to determining acute life threatening conditions related to emergency care: X-ray of the chest,  which revealed evidence of left-sided infiltrate/opacity  Treatment and Reassessment: Patient's blood work is overall reassuring, no lactic acidosis noted.  However, he is profoundly weak, has cough with green phlegm and unable to ambulate.  High risk for falls.  Normally active.  Signs of dehydration on our work-up as well.  Best to admit patient for community-acquired pneumonia.  COVID-19 test was ordered, it is negative.  Patient continues to have A-fib with RVR here.  He took his oral metoprolol.  We will give him more metoprolol orally.  IV diltiazem not indicated at this time in my opinion as his blood pressure is fine and he is tolerating elevated heart rate okay.  Underlying cause is infection, which we are treating.  Final Clinical Impression(s) / ED Diagnoses Final diagnoses:  Community acquired pneumonia of left lower lobe of lung  Atrial fibrillation with RVR ([place])    Rx / DC Orders ED Discharge Orders     None         Varney Biles, MD 12/31/21 1350

## 2021-12-31 NOTE — Assessment & Plan Note (Addendum)
S/p decompression and laminectomy in 04/2021.  No need identified by therapy

## 2021-12-31 NOTE — Assessment & Plan Note (Addendum)
Presents with general malaise, productive cough and poor p.o. intake.  No leukocytosis or fever.  CXR concerning for LLL infiltrate.  Blood cultures NGTD.  Pro-Cal slightly elevated.  Symptoms improved.  Antibiotics as above

## 2021-12-31 NOTE — Plan of Care (Signed)
Report received from [facility] ED. MD paged upon pt arrival to unit, VS taken, pt oriented to room and call bell.   Problem: Education: Goal: Knowledge of General Education information will improve Description: Including pain rating scale, medication(s)/side effects and non-pharmacologic comfort measures Outcome: Progressing   Problem: Health Behavior/Discharge Planning: Goal: Ability to manage health-related needs will improve Outcome: Progressing   Problem: Clinical Measurements: Goal: Ability to maintain clinical measurements within normal limits will improve Outcome: Progressing Goal: Will remain free from infection Outcome: Progressing Goal: Diagnostic test results will improve Outcome: Progressing Goal: Respiratory complications will improve Outcome: Progressing   Problem: Activity: Goal: Risk for activity intolerance will decrease Outcome: Progressing   Problem: Safety: Goal: Ability to remain free from injury will improve Outcome: Progressing   Problem: Skin Integrity: Goal: Risk for impaired skin integrity will decrease Outcome: Progressing

## 2022-01-01 ENCOUNTER — Observation Stay (HOSPITAL_COMMUNITY): Payer: Medicare PPO

## 2022-01-01 DIAGNOSIS — Z66 Do not resuscitate: Secondary | ICD-10-CM | POA: Diagnosis present

## 2022-01-01 DIAGNOSIS — I4891 Unspecified atrial fibrillation: Secondary | ICD-10-CM | POA: Diagnosis present

## 2022-01-01 DIAGNOSIS — Z7901 Long term (current) use of anticoagulants: Secondary | ICD-10-CM | POA: Diagnosis not present

## 2022-01-01 DIAGNOSIS — Z20822 Contact with and (suspected) exposure to covid-19: Secondary | ICD-10-CM | POA: Diagnosis present

## 2022-01-01 DIAGNOSIS — R531 Weakness: Secondary | ICD-10-CM | POA: Diagnosis not present

## 2022-01-01 DIAGNOSIS — E876 Hypokalemia: Secondary | ICD-10-CM

## 2022-01-01 DIAGNOSIS — I480 Paroxysmal atrial fibrillation: Secondary | ICD-10-CM | POA: Diagnosis present

## 2022-01-01 DIAGNOSIS — Z8546 Personal history of malignant neoplasm of prostate: Secondary | ICD-10-CM | POA: Diagnosis not present

## 2022-01-01 DIAGNOSIS — Z85828 Personal history of other malignant neoplasm of skin: Secondary | ICD-10-CM | POA: Diagnosis not present

## 2022-01-01 DIAGNOSIS — E860 Dehydration: Secondary | ICD-10-CM | POA: Diagnosis present

## 2022-01-01 DIAGNOSIS — N1831 Chronic kidney disease, stage 3a: Secondary | ICD-10-CM | POA: Diagnosis present

## 2022-01-01 DIAGNOSIS — Z888 Allergy status to other drugs, medicaments and biological substances status: Secondary | ICD-10-CM | POA: Diagnosis not present

## 2022-01-01 DIAGNOSIS — N179 Acute kidney failure, unspecified: Secondary | ICD-10-CM | POA: Diagnosis present

## 2022-01-01 DIAGNOSIS — K219 Gastro-esophageal reflux disease without esophagitis: Secondary | ICD-10-CM | POA: Diagnosis present

## 2022-01-01 DIAGNOSIS — D696 Thrombocytopenia, unspecified: Secondary | ICD-10-CM | POA: Diagnosis present

## 2022-01-01 DIAGNOSIS — Z79899 Other long term (current) drug therapy: Secondary | ICD-10-CM | POA: Diagnosis not present

## 2022-01-01 DIAGNOSIS — J189 Pneumonia, unspecified organism: Secondary | ICD-10-CM | POA: Diagnosis present

## 2022-01-01 DIAGNOSIS — I129 Hypertensive chronic kidney disease with stage 1 through stage 4 chronic kidney disease, or unspecified chronic kidney disease: Secondary | ICD-10-CM | POA: Diagnosis present

## 2022-01-01 LAB — RENAL FUNCTION PANEL
Albumin: 2.8 g/dL — ABNORMAL LOW (ref 3.5–5.0)
Anion gap: 8 (ref 5–15)
BUN: 20 mg/dL (ref 8–23)
CO2: 21 mmol/L — ABNORMAL LOW (ref 22–32)
Calcium: 8.2 mg/dL — ABNORMAL LOW (ref 8.9–10.3)
Chloride: 108 mmol/L (ref 98–111)
Creatinine, Ser: 0.83 mg/dL (ref 0.61–1.24)
GFR, Estimated: >60 mL/min (ref >60)
Glucose, Bld: 108 mg/dL — ABNORMAL HIGH (ref 70–99)
Phosphorus: 2.4 mg/dL — ABNORMAL LOW (ref 2.5–4.6)
Potassium: 4.1 mmol/L (ref 3.5–5.1)
Sodium: 137 mmol/L (ref 135–145)

## 2022-01-01 LAB — ECHOCARDIOGRAM COMPLETE
AR max vel: 1.75 cm2
AV Area VTI: 1.57 cm2
AV Area mean vel: 1.61 cm2
AV Mean grad: 2 mmHg
AV Peak grad: 3.6 mmHg
Ao pk vel: 0.95 m/s
Area-P 1/2: 7.16 cm2
Calc EF: 54 %
Height: 66 in
MV M vel: 4.07 m/s
MV Peak grad: 66.3 mmHg
S' Lateral: 2.5 cm
Single Plane A2C EF: 52.6 %
Single Plane A4C EF: 57.7 %
Weight: 2448 oz

## 2022-01-01 LAB — CBC
HCT: 42.1 % (ref 39.0–52.0)
Hemoglobin: 14.5 g/dL (ref 13.0–17.0)
MCH: 34.9 pg — ABNORMAL HIGH (ref 26.0–34.0)
MCHC: 34.4 g/dL (ref 30.0–36.0)
MCV: 101.2 fL — ABNORMAL HIGH (ref 80.0–100.0)
Platelets: 120 10*3/uL — ABNORMAL LOW (ref 150–400)
RBC: 4.16 MIL/uL — ABNORMAL LOW (ref 4.22–5.81)
RDW: 13.2 % (ref 11.5–15.5)
WBC: 6.1 10*3/uL (ref 4.0–10.5)
nRBC: 0 % (ref 0.0–0.2)

## 2022-01-01 LAB — MAGNESIUM: Magnesium: 1.8 mg/dL (ref 1.7–2.4)

## 2022-01-01 LAB — PROCALCITONIN: Procalcitonin: <0.1 ng/mL

## 2022-01-01 MED ORDER — SACCHAROMYCES BOULARDII 250 MG PO CAPS
250.0000 mg | ORAL_CAPSULE | Freq: Two times a day (BID) | ORAL | Status: DC
  Administered 2022-01-01 – 2022-01-02 (×3): 250 mg via ORAL
  Filled 2022-01-01 (×3): qty 1

## 2022-01-01 MED ORDER — LACTATED RINGERS IV SOLN: INTRAVENOUS | Status: DC

## 2022-01-01 MED ORDER — METOPROLOL TARTRATE 25 MG PO TABS
25.0000 mg | ORAL_TABLET | Freq: Two times a day (BID) | ORAL | Status: DC
  Administered 2022-01-01 (×2): 25 mg via ORAL
  Filled 2022-01-01 (×2): qty 1

## 2022-01-01 MED ORDER — MAGNESIUM SULFATE IN D5W 1-5 GM/100ML-% IV SOLN
1.0000 g | Freq: Once | INTRAVENOUS | Status: AC
Start: 2022-01-01 — End: 2022-01-01
  Administered 2022-01-01: 1 g via INTRAVENOUS
  Filled 2022-01-01: qty 100

## 2022-01-01 MED ORDER — DILTIAZEM HCL 30 MG PO TABS
30.0000 mg | ORAL_TABLET | Freq: Four times a day (QID) | ORAL | Status: DC | PRN
  Administered 2022-01-01: 30 mg via ORAL
  Filled 2022-01-01: qty 1

## 2022-01-01 MED ORDER — DILTIAZEM LOAD VIA INFUSION
10.0000 mg | Freq: Once | INTRAVENOUS | Status: DC
Start: 2022-01-01 — End: 2022-01-01

## 2022-01-01 MED ORDER — DILTIAZEM HCL 25 MG/5ML IV SOLN
10.0000 mg | INTRAVENOUS | Status: AC
  Administered 2022-01-01: 10 mg via INTRAVENOUS
  Filled 2022-01-01: qty 5

## 2022-01-01 NOTE — Progress Notes (Signed)
Echocardiogram 2D Echocardiogram has been performed.  Joseph [name] 01/01/2022, 3:00 PM

## 2022-01-01 NOTE — Evaluation (Signed)
Physical Therapy Evaluation Patient Details Name: Joseph [name] MRN: [id number] DOB: 25-Mar-1936 Today's Date: 01/01/2022  History of Present Illness  86 year old M with PMH of A-fib on Eliquis, lumbar spinal stenosis status post decompression laminectomy 04/2021 and prostate cancer s/p prostatectomy in 2016 presenting with generalized weakness, poor p.o. intake and productive cough. Patient admitted with pneumonia.    Clinical Impression  JERRED ZAREMBA is 86 y.o. male admitted with above HPI and diagnosis. Patient is currently limited by functional impairments below (see PT problem list). Patient lives with his spouse and is independent at baseline. Patient evaluated by Physical Therapy with no further acute PT needs identified. All education has been completed and the patient has no further questions. He is mobilizing at supervision to mod independent level and is safe to mobilize with RN staff. See below for any follow-up Physical Therapy or equipment needs. PT is signing off. Thank you for this referral.        Recommendations for follow up therapy are one component of a multi-disciplinary discharge planning process, led by the attending physician.  Recommendations may be updated based on patient status, additional functional criteria and insurance authorization.  Follow Up Recommendations No PT follow up    Assistance Recommended at Discharge PRN  Patient can return home with the following       Equipment Recommendations None recommended by PT  Recommendations for Other Services       Functional Status Assessment Patient has had a recent decline in their functional status and demonstrates the ability to make significant improvements in function in a reasonable and predictable amount of time.     Precautions / Restrictions Precautions Precautions: None Restrictions Weight Bearing Restrictions: No      Mobility  Bed Mobility               General bed mobility comments: pt OOB  in recliner    Transfers Overall transfer level: Needs assistance Equipment used: None Transfers: Sit to/from Stand Sit to Stand: Supervision           General transfer comment: supervision for safety    Ambulation/Gait Ambulation/Gait assistance: Min guard, Supervision Gait Distance (Feet): 160 Feet Assistive device: IV Pole, None Gait Pattern/deviations: Step-through pattern, WFL(Within Functional Limits) Gait velocity: decr     General Gait Details: Overall pt steady with normal gait pattern. cues for hand placement on IV pole and pt steady with and wihtout UE support.  Stairs            Wheelchair Mobility    Modified Rankin (Stroke Patients Only)       Balance Overall balance assessment: No apparent balance deficits (not formally assessed) (denies falls)                                           Pertinent Vitals/Pain Pain Assessment Pain Assessment: 0-10 Pain Score: 3  Pain Location: back chronic Pain Descriptors / Indicators: Aching, Discomfort Pain Intervention(s): Repositioned    Home Living Family/patient expects to be discharged to:: Private residence ([facility] at [place]) Living Arrangements: Spouse/significant other Available Help at Discharge: Family;Available 24 hours/day Type of Home: House Home Access: Level entry       Home Layout: One level Home Equipment: Conservation officer, nature (2 wheels);Cane - quad;Grab bars - tub/shower;Grab bars - toilet;BSC/3in1;Shower seat - built in      Prior Function Prior Level  of Function : Independent/Modified Independent             Mobility Comments: pt walks regularly in neighborhood and participates in chair volleyball       Hand Dominance   Dominant Hand: Right    Extremity/Trunk Assessment   Upper Extremity Assessment Upper Extremity Assessment: RUE deficits/detail;LUE deficits/detail RUE Deficits / Details: WFL ROM, 4/5 shoulder strength, 5/5 elbow, 5/5 wrist, 4/5  grip RUE Sensation: WNL RUE Coordination: WNL LUE Deficits / Details: WFL ROM, 5/5 shoulder strength, 5/5 elbow, 5/5 wrist, 4/5 grip LUE Sensation: WNL LUE Coordination: WNL    Lower Extremity Assessment Lower Extremity Assessment: Defer to PT evaluation    Cervical / Trunk Assessment Cervical / Trunk Assessment: Normal  Communication   Communication: No difficulties  Cognition Arousal/Alertness: Awake/alert Behavior During Therapy: WFL for tasks assessed/performed Overall Cognitive Status: Within Functional Limits for tasks assessed                                          General Comments      Exercises     Assessment/Plan    PT Assessment Patient does not need any further PT services  PT Problem List         PT Treatment Interventions      PT Goals (Current goals can be found in the Care Plan section)  Acute Rehab PT Goals Patient Stated Goal: get back home and to normal routine PT Goal Formulation: All assessment and education complete, DC therapy Time For Goal Achievement: 01/02/22 Potential to Achieve Goals: Good    Frequency       Co-evaluation               AM-PAC PT "6 Clicks" Mobility  Outcome Measure Help needed turning from your back to your side while in a flat bed without using bedrails?: None Help needed moving from lying on your back to sitting on the side of a flat bed without using bedrails?: None Help needed moving to and from a bed to a chair (including a wheelchair)?: None Help needed standing up from a chair using your arms (e.g., wheelchair or bedside chair)?: None Help needed to walk in hospital room?: A Little Help needed climbing 3-5 steps with a railing? : A Little 6 Click Score: 22    End of Session Equipment Utilized During Treatment: Gait belt Activity Tolerance: Patient tolerated treatment well Patient left: with call bell/phone within reach;in chair;with family/visitor present Nurse Communication:  Mobility status PT Visit Diagnosis: Unsteadiness on feet (R26.81)    Time: 1000-1013 PT Time Calculation (min) (ACUTE ONLY): 13 min   Charges:   PT Evaluation $PT Eval Low Complexity: 1 Low          Verner Mould, DPT Acute Rehabilitation Services Office [phone] Pager [phone]  01/01/22 10:33 AM

## 2022-01-01 NOTE — Progress Notes (Signed)
  Transition of Care [facility]) Screening Note   Patient Details  Name: Joseph [name] Date of Birth: 1935-12-29   Transition of Care [facility]) CM/SW Contact:    Dessa Phi, RN Phone Number: 01/01/2022, 12:00 PM    Transition of Care Department [facility]) has reviewed patient and no TOC needs have been identified at this time. We will continue to monitor patient advancement through interdisciplinary progression rounds. If new patient transition needs arise, please place a TOC consult.

## 2022-01-01 NOTE — Care Management Obs Status (Signed)
[place] NOTIFICATION   Patient Details  Name: Joseph [name] MRN: [id number] Date of Birth: Apr 18, 1936   Medicare Observation Status Notification Given:  Yes    MahabirJuliann Pulse, RN 01/01/2022, 12:00 PM

## 2022-01-01 NOTE — Evaluation (Signed)
Occupational Therapy Evaluation Patient Details Name: Joseph [name] MRN: [id number] DOB: 20-Jan-1936 Today's Date: 01/01/2022   History of Present Illness 86 year old M with PMH of A-fib on Eliquis, lumbar spinal stenosis status post decompression laminectomy 04/2021 and prostate cancer s/p prostatectomy in 2016 presenting with generalized weakness, poor p.o. intake and productive cough. Patient admitted with pneumonia.   Clinical Impression   Joseph [name] demonstrate ability to perform functional mobility in room and all Adls. He reports feeling generally weak but improved from the last two days. He has no OT needs at this time.      Recommendations for follow up therapy are one component of a multi-disciplinary discharge planning process, led by the attending physician.  Recommendations may be updated based on patient status, additional functional criteria and insurance authorization.   Follow Up Recommendations  No OT follow up    Assistance Recommended at Discharge None  Patient can return home with the following      Functional Status Assessment  Patient has not had a recent decline in their functional status  Equipment Recommendations  None recommended by OT    Recommendations for Other Services       Precautions / Restrictions Precautions Precautions: None Restrictions Weight Bearing Restrictions: No      Mobility Bed Mobility Overal bed mobility: Modified Independent                  Transfers Overall transfer level: Independent                        Balance Overall balance assessment: Mild deficits observed, not formally tested                                         ADL either performed or assessed with clinical judgement   ADL Overall ADL's : Independent                                       General ADL Comments: Patient demonstrated independence with donning socks, perfomring toileting and standing  grooming tasks.     Vision Baseline Vision/History: 1 Wears glasses       Perception     Praxis      Pertinent Vitals/Pain Pain Assessment Pain Assessment: No/denies pain     Hand Dominance Right   Extremity/Trunk Assessment Upper Extremity Assessment Upper Extremity Assessment: RUE deficits/detail;LUE deficits/detail RUE Deficits / Details: WFL ROM, 4/5 shoulder strength, 5/5 elbow, 5/5 wrist, 4/5 grip RUE Sensation: WNL RUE Coordination: WNL LUE Deficits / Details: WFL ROM, 5/5 shoulder strength, 5/5 elbow, 5/5 wrist, 4/5 grip LUE Sensation: WNL LUE Coordination: WNL   Lower Extremity Assessment Lower Extremity Assessment: Defer to PT evaluation   Cervical / Trunk Assessment Cervical / Trunk Assessment: Normal   Communication Communication Communication: No difficulties   Cognition Arousal/Alertness: Awake/alert Behavior During Therapy: WFL for tasks assessed/performed Overall Cognitive Status: Within Functional Limits for tasks assessed                                       General Comments       Exercises     Shoulder Instructions      Home  Living Family/patient expects to be discharged to:: Private residence [facility]) Living Arrangements: Spouse/significant other Available Help at Discharge: Family;Available 24 hours/day Type of Home: House Home Access: Level entry     Home Layout: One level     Bathroom Shower/Tub: Walk-in shower;Tub/shower unit   Bathroom Toilet: Handicapped height Bathroom Accessibility: Yes   Home Equipment: Conservation officer, nature (2 wheels);Cane - quad;Grab bars - tub/shower;Grab bars - toilet;BSC/3in1;Shower seat - built in          Prior Functioning/Environment Prior Level of Function : Independent/Modified Independent                        OT Problem List:        OT Treatment/Interventions:      OT Goals(Current goals can be found in the care plan section) Acute Rehab OT Goals OT  Goal Formulation: All assessment and education complete, DC therapy  OT Frequency:      Co-evaluation              AM-PAC OT "6 Clicks" Daily Activity     Outcome Measure Help from another person eating meals?: None Help from another person taking care of personal grooming?: None Help from another person toileting, which includes using toliet, bedpan, or urinal?: None Help from another person bathing (including washing, rinsing, drying)?: None Help from another person to put on and taking off regular upper body clothing?: None Help from another person to put on and taking off regular lower body clothing?: None 6 Click Score: 24   End of Session Nurse Communication: Mobility status  Activity Tolerance: Patient tolerated treatment well Patient left: in chair;with call bell/phone within reach  OT Visit Diagnosis: Muscle weakness (generalized) (M62.81)                Time: 6754-4920 OT Time Calculation (min): 21 min Charges:  OT General Charges $OT Visit: 1 Visit OT Evaluation $OT Eval Low Complexity: 1 Low  Rodman Recupero, OTR/L [place]  Office [phone] Pager: [phone]   Lenward Chancellor 01/01/2022, 10:27 AM

## 2022-01-01 NOTE — Progress Notes (Signed)
PROGRESS NOTE  BLEU MINERD ZSW:[id number] DOB: 1936/03/01   PCP: [name], MD  Patient is from: ILF.  DOA: 12/31/2021 LOS: 0  Chief complaints Chief Complaint  Patient presents with   Weakness     Brief Narrative / Interim history: 86 year old M with PMH of A-fib on Eliquis, lumbar spinal stenosis status post decompression laminectomy 04/2021 and prostate cancer s/p prostatectomy in 2016 presenting with generalized weakness, poor p.o. intake and productive cough.  Patient reports generalized weakness for 4 days.  Started on Monday when he struggles to get out of the bed.  After he finally managed to get out and went to the bathroom, he could not get back on the bed.  This continued for the last 4 days without significant improvement.  Eventually, visiting nurse at [place] advised to go to ED for evaluation.  He also reports poor p.o. intake over the same period of time.  He reports productive cough with grayish phlegm for 4 weeks.  He admits to dyspnea on exertion.  He denies chest pain, runny nose, sore throat, fever, chills, nausea, vomiting, abdominal pain, dizziness, palpitation, orthopnea, PND or edema.  Denies bowel or bladder habit change.  Denies new focal neuro symptoms.  Denies changes to his medication.  He reports good compliance with his metoprolol and Eliquis.  Last dose of this morning.  He denies new medication other than the Augmentin he was started on by the nurse yesterday.  He took 1 dose yesterday.   Patient lives at [facility].  Denies ever smoking cigarettes.  Admits to social alcohol.  Denies recreational drug use.  He thinks DNR/DNI is appropriate.  In ED, tachycardic to 130s but improved to 102 after p.o. metoprolol and IV fluid.  Mild temp to 99.3.  Otherwise, vital stable.  Cr 1.34 (about baseline).  BUN 28.  Hgb 17.4.  UA with 40 ketonuria and increased gravity suggesting dehydration.  CXR concerning for LLL infiltrate.  COVID-19 and influenza PCR nonreactive.  EKG features  A-fib with RVR at a rate of 130s.  Blood cultures drawn.  Received metoprolol 25 mg, azithromycin, ceftriaxone, NS bolus and admitted for community-acquired pneumonia.    Subjective: Seen and examined earlier this morning and later this afternoon.  No major events overnight of this morning.  Feels better today but still with significant cough when he takes a deep breath.  Denies palpitation, dizziness, chest pain.  Had some loose stools earlier this morning.  No melena or hematochezia.  Objective: Vitals:   01/01/22 0027 01/01/22 0532 01/01/22 1110 01/01/22 1256  BP: 124/84 131/73 (!) 126/93 126/79  Pulse: 100 99 82 97  Resp: '14 16  20  '$ Temp: 98.8 F (37.1 C) 98.5 F (36.9 C)  99.2 F (37.3 C)  TempSrc: Oral Oral  Oral  SpO2: 92% 92%  91%  Weight:      Height:        Examination:  GENERAL: No apparent distress.  Nontoxic. HEENT: MMM.  Vision and hearing grossly intact.  NECK: Supple.  No apparent JVD.  RESP:  No IWOB.  Fair aeration bilaterally. CVS: Irregular rhythm.  Normal rate.  Heart sounds normal.  ABD/GI/GU: BS+. Abd soft, NTND.  MSK/EXT:  Moves extremities. No apparent deformity. No edema.  SKIN: no apparent skin lesion or wound NEURO: Awake, alert and oriented appropriately.  No apparent focal neuro deficit. PSYCH: Calm. Normal affect.   Procedures:  None  Microbiology summarized: [id number] and influenza PCR nonreactive. Blood cultures NGTD.  Assessment  and Plan: * CAP (community acquired pneumonia) Presents with general malaise, productive cough and poor p.o. intake.  No leukocytosis or fever.  CXR concerning for LLL infiltrate.  Blood cultures NGTD.  Pro-Cal slightly elevated.  Still with significant cough.  -Continue IV ceftriaxone and azithromycin -Monitor procalcitonin -Continue IV fluid  Paroxysmal atrial fibrillation with RVR (HCC) RVR improved.  HR in 90s.  Could be provoked by pneumonia.  Reports good compliance with metoprolol and Eliquis.  TSH  within normal. -Continue home metoprolol 25 mg twice daily -P.o. Cardizem to 30 mg every 6 hours as needed -Continue home Eliquis -Optimize electrolytes -Follow echocardiogram  Dehydration Has poor p.o. intake, hemoconcentration and ketonuria -Continue IV fluid  AKI (acute kidney injury) ([place]) Recent Labs    04/08/21 1026 04/28/21 1223 12/31/21 1110 01/01/22 0530  BUN 16 12 28* 20  CREATININE 1.26 1.36* 1.31* 0.83  Initially felt to be CKD but seems like he had an AKI from poor p.o. intake in the setting of pneumonia and A-fib with RVR. -Continue IV fluid   Generalized weakness PT/OT eval.  Spinal stenosis S/p decompression and laminectomy in 04/2021. -PT/OT eval   DVT prophylaxis:   apixaban (ELIQUIS) tablet 5 mg  Code Status: DNR/DNI Family Communication: Updated patient's wife at bedside. Level of care: Telemetry Status is: Inpatient Remains inpatient appropriate because: Due to community-acquired pneumonia requiring IV antibiotics   Final disposition: Likely back to ILF. Consultants:  None  Sch Meds:  Scheduled Meds:  apixaban  5 mg Oral BID   guaiFENesin  600 mg Oral BID   loratadine  10 mg Oral Daily   metoprolol tartrate  25 mg Oral BID   saccharomyces boulardii  250 mg Oral BID   Continuous Infusions:  azithromycin 500 mg (01/01/22 1422)   cefTRIAXone (ROCEPHIN)  IV 2 g (01/01/22 1147)   lactated ringers 100 mL/hr at 01/01/22 1114   PRN Meds:.acetaminophen **OR** acetaminophen, diltiazem, guaiFENesin-dextromethorphan, ketotifen, levalbuterol, metoprolol tartrate, polyethylene glycol, senna-docusate, traZODone  Antimicrobials: Anti-infectives (From admission, onward)    Start     Dose/Rate Route Frequency Ordered Stop   12/31/21 1230  cefTRIAXone (ROCEPHIN) 2 g in sodium chloride 0.9 % 100 mL IVPB        2 g 200 mL/hr over 30 Minutes Intravenous Every 24 hours 12/31/21 1223 01/05/22 1229   12/31/21 1230  azithromycin (ZITHROMAX) 500 mg in  sodium chloride 0.9 % 250 mL IVPB        500 mg 250 mL/hr over 60 Minutes Intravenous Every 24 hours 12/31/21 1223 01/05/22 1359        I have personally reviewed the following labs and images: CBC: Recent Labs  Lab 12/31/21 1110 12/31/21 1200 01/01/22 0530  WBC 8.1 8.0 6.1  NEUTROABS  --  6.3  --   HGB 17.4* 17.4* 14.5  HCT 51.7 51.0 42.1  MCV 99.8 100.2* 101.2*  PLT 157 162 120*   BMP &GFR Recent Labs  Lab 12/31/21 1110 01/01/22 0530  NA 135 137  K 4.5 4.1  CL 100 108  CO2 22 21*  GLUCOSE 126* 108*  BUN 28* 20  CREATININE 1.31* 0.83  CALCIUM 9.6 8.2*  MG  --  1.8  PHOS  --  2.4*   Estimated Creatinine Clearance: 59.8 mL/min (by C-G formula based on SCr of 0.83 mg/dL). Liver & Pancreas: Recent Labs  Lab 12/31/21 1223 01/01/22 0530  AST 20  --   ALT 14  --   ALKPHOS 57  --  BILITOT 0.6  --   PROT 7.6  --   ALBUMIN 4.3 2.8*   No results for input(s): LIPASE, AMYLASE in the last 168 hours. No results for input(s): AMMONIA in the last 168 hours. Diabetic: No results for input(s): HGBA1C in the last 72 hours. No results for input(s): GLUCAP in the last 168 hours. Cardiac Enzymes: No results for input(s): CKTOTAL, CKMB, CKMBINDEX, TROPONINI in the last 168 hours. No results for input(s): PROBNP in the last 8760 hours. Coagulation Profile: Recent Labs  Lab 12/31/21 1223  INR 1.2   Thyroid Function Tests: Recent Labs    12/31/21 1751  TSH 1.419   Lipid Profile: No results for input(s): CHOL, HDL, LDLCALC, TRIG, CHOLHDL, LDLDIRECT in the last 72 hours. Anemia Panel: No results for input(s): VITAMINB12, FOLATE, FERRITIN, TIBC, IRON, RETICCTPCT in the last 72 hours. Urine analysis:    Component Value Date/Time   COLORURINE BROWN (A) 12/31/2021 1310   APPEARANCEUR CLEAR 12/31/2021 1310   LABSPEC 1.038 (H) 12/31/2021 1310   PHURINE 5.5 12/31/2021 1310   GLUCOSEU NEGATIVE 12/31/2021 1310   HGBUR MODERATE (A) 12/31/2021 1310   BILIRUBINUR  NEGATIVE 12/31/2021 1310   KETONESUR 40 (A) 12/31/2021 1310   PROTEINUR 100 (A) 12/31/2021 1310   UROBILINOGEN 0.2 11/24/2010 1215   NITRITE NEGATIVE 12/31/2021 1310   LEUKOCYTESUR NEGATIVE 12/31/2021 1310   Sepsis Labs: Invalid input(s): PROCALCITONIN, [place]  Microbiology: Recent Results (from the past 240 hour(s))  Resp Panel by RT-PCR (Flu A&B, Covid) Anterior Nasal Swab     Status: None   Collection Time: 12/31/21 11:10 AM   Specimen: Anterior Nasal Swab  Result Value Ref Range Status   SARS Coronavirus 2 by RT PCR NEGATIVE NEGATIVE Final    Comment: (NOTE) SARS-CoV-2 target nucleic acids are NOT DETECTED.  The SARS-CoV-2 RNA is generally detectable in upper respiratory specimens during the acute phase of infection. The lowest concentration of SARS-CoV-2 viral copies this assay can detect is 138 copies/mL. A negative result does not preclude SARS-Cov-2 infection and should not be used as the sole basis for treatment or other patient management decisions. A negative result may occur with  improper specimen collection/handling, submission of specimen other than nasopharyngeal swab, presence of viral mutation(s) within the areas targeted by this assay, and inadequate number of viral copies(<138 copies/mL). A negative result must be combined with clinical observations, patient history, and epidemiological information. The expected result is Negative.  Fact Sheet for Patients:  EntrepreneurPulse.com.au  Fact Sheet for Healthcare Providers:  IncredibleEmployment.be  This test is no t yet approved or cleared by the [place] FDA and  has been authorized for detection and/or diagnosis of SARS-CoV-2 by FDA under an Emergency Use Authorization (EUA). This EUA will remain  in effect (meaning this test can be used) for the duration of the COVID-19 declaration under Section 564(b)(1) of the Act, 21 U.S.C.section 360bbb-3(b)(1), unless  the authorization is terminated  or revoked sooner.       Influenza A by PCR NEGATIVE NEGATIVE Final   Influenza B by PCR NEGATIVE NEGATIVE Final    Comment: (NOTE) The Xpert Xpress SARS-CoV-2/FLU/RSV plus assay is intended as an aid in the diagnosis of influenza from Nasopharyngeal swab specimens and should not be used as a sole basis for treatment. Nasal washings and aspirates are unacceptable for Xpert Xpress SARS-CoV-2/FLU/RSV testing.  Fact Sheet for Patients: EntrepreneurPulse.com.au  Fact Sheet for Healthcare Providers: IncredibleEmployment.be  This test is not yet approved or cleared by the [place]  FDA and has been authorized for detection and/or diagnosis of SARS-CoV-2 by FDA under an Emergency Use Authorization (EUA). This EUA will remain in effect (meaning this test can be used) for the duration of the COVID-19 declaration under Section 564(b)(1) of the Act, 21 U.S.C. section 360bbb-3(b)(1), unless the authorization is terminated or revoked.  Performed at [facility], [address] [place], [place] [address]   SARS Coronavirus 2 by RT PCR (hospital order, performed in [facility] hospital lab) *cepheid single result test*     Status: None   Collection Time: 12/31/21 12:00 PM  Result Value Ref Range Status   SARS Coronavirus 2 by RT PCR NEGATIVE NEGATIVE Final    Comment: (NOTE) SARS-CoV-2 target nucleic acids are NOT DETECTED.  The SARS-CoV-2 RNA is generally detectable in upper and lower respiratory specimens during the acute phase of infection. The lowest concentration of SARS-CoV-2 viral copies this assay can detect is 250 copies / mL. A negative result does not preclude SARS-CoV-2 infection and should not be used as the sole basis for treatment or other patient management decisions.  A negative result may occur with improper specimen collection / handling, submission of specimen other than  nasopharyngeal swab, presence of viral mutation(s) within the areas targeted by this assay, and inadequate number of viral copies (<250 copies / mL). A negative result must be combined with clinical observations, patient history, and epidemiological information.  Fact Sheet for Patients:   https://www.patel.info/  Fact Sheet for Healthcare Providers: https://hall.com/  This test is not yet approved or  cleared by the [place] FDA and has been authorized for detection and/or diagnosis of SARS-CoV-2 by FDA under an Emergency Use Authorization (EUA).  This EUA will remain in effect (meaning this test can be used) for the duration of the COVID-19 declaration under Section 564(b)(1) of the Act, 21 U.S.C. section 360bbb-3(b)(1), unless the authorization is terminated or revoked sooner.  Performed at [facility], [address] [place], [place] [address]   Blood Culture (routine x 2)     Status: None (Preliminary result)   Collection Time: 12/31/21 12:37 PM   Specimen: BLOOD  Result Value Ref Range Status   Specimen Description   Final    BLOOD RIGHT ANTECUBITAL Performed at [facility], [address] [place], [place] [address]    Special Requests   Final    Blood Culture adequate volume BOTTLES DRAWN AEROBIC AND ANAEROBIC Performed at [facility], [address], [place], [place] [address]    Culture   Final    NO GROWTH < 24 HOURS Performed at [facility], [place] [address] [place],  [address]    Report Status PENDING  Incomplete    Radiology Studies: No results found.    Nekeshia Lenhardt T. [place]  If 7PM-7AM, please contact night-coverage [email or web address] 01/01/2022, 3:43 PM

## 2022-01-02 DIAGNOSIS — N179 Acute kidney failure, unspecified: Secondary | ICD-10-CM | POA: Diagnosis not present

## 2022-01-02 DIAGNOSIS — J189 Pneumonia, unspecified organism: Secondary | ICD-10-CM | POA: Diagnosis not present

## 2022-01-02 DIAGNOSIS — R531 Weakness: Secondary | ICD-10-CM | POA: Diagnosis not present

## 2022-01-02 DIAGNOSIS — D696 Thrombocytopenia, unspecified: Secondary | ICD-10-CM

## 2022-01-02 DIAGNOSIS — E860 Dehydration: Secondary | ICD-10-CM | POA: Diagnosis not present

## 2022-01-02 DIAGNOSIS — E876 Hypokalemia: Secondary | ICD-10-CM

## 2022-01-02 LAB — CBC
HCT: 42 % (ref 39.0–52.0)
Hemoglobin: 14.4 g/dL (ref 13.0–17.0)
MCH: 34.8 pg — ABNORMAL HIGH (ref 26.0–34.0)
MCHC: 34.3 g/dL (ref 30.0–36.0)
MCV: 101.4 fL — ABNORMAL HIGH (ref 80.0–100.0)
Platelets: 141 10*3/uL — ABNORMAL LOW (ref 150–400)
RBC: 4.14 MIL/uL — ABNORMAL LOW (ref 4.22–5.81)
RDW: 13.2 % (ref 11.5–15.5)
WBC: 6.3 10*3/uL (ref 4.0–10.5)
nRBC: 0 % (ref 0.0–0.2)

## 2022-01-02 LAB — RENAL FUNCTION PANEL
Albumin: 2.8 g/dL — ABNORMAL LOW (ref 3.5–5.0)
Anion gap: 6 (ref 5–15)
BUN: 17 mg/dL (ref 8–23)
CO2: 27 mmol/L (ref 22–32)
Calcium: 8.3 mg/dL — ABNORMAL LOW (ref 8.9–10.3)
Chloride: 103 mmol/L (ref 98–111)
Creatinine, Ser: 0.96 mg/dL (ref 0.61–1.24)
GFR, Estimated: >60 mL/min (ref >60)
Glucose, Bld: 104 mg/dL — ABNORMAL HIGH (ref 70–99)
Phosphorus: 3.2 mg/dL (ref 2.5–4.6)
Potassium: 4.1 mmol/L (ref 3.5–5.1)
Sodium: 136 mmol/L (ref 135–145)

## 2022-01-02 LAB — PROCALCITONIN: Procalcitonin: <0.1 ng/mL

## 2022-01-02 LAB — MAGNESIUM: Magnesium: 2 mg/dL (ref 1.7–2.4)

## 2022-01-02 MED ORDER — AZITHROMYCIN 250 MG PO TABS: 250.0000 mg | ORAL_TABLET | Freq: Every day | ORAL | 0 refills | Status: AC

## 2022-01-02 MED ORDER — METOPROLOL TARTRATE 50 MG PO TABS: 50.0000 mg | ORAL_TABLET | Freq: Two times a day (BID) | ORAL | 1 refills | Status: DC

## 2022-01-02 MED ORDER — AZITHROMYCIN 250 MG PO TABS
250.0000 mg | ORAL_TABLET | Freq: Every day | ORAL | Status: DC
  Administered 2022-01-02: 250 mg via ORAL
  Filled 2022-01-02: qty 1

## 2022-01-02 MED ORDER — GUAIFENESIN ER 600 MG PO TB12: 600.0000 mg | ORAL_TABLET | Freq: Two times a day (BID) | ORAL | 0 refills | Status: AC

## 2022-01-02 MED ORDER — METOPROLOL TARTRATE 50 MG PO TABS
50.0000 mg | ORAL_TABLET | Freq: Two times a day (BID) | ORAL | Status: DC
  Administered 2022-01-02: 50 mg via ORAL
  Filled 2022-01-02: qty 1

## 2022-01-02 MED ORDER — AMOXICILLIN-POT CLAVULANATE 875-125 MG PO TABS
1.0000 | ORAL_TABLET | Freq: Two times a day (BID) | ORAL | 0 refills | Status: AC
Start: 2022-01-02 — End: 2022-01-04

## 2022-01-02 NOTE — Discharge Summary (Signed)
Physician Discharge Summary  [name]:[id number] DOB: February 06, 1936 DOA: 12/31/2021  PCP: Joseph Pretty, MD  Admit date: 12/31/2021 Discharge date: 01/02/2022 Admitted From: ILF Disposition: ILF Recommendations for Outpatient Follow-up:  Follow ups as below. Please obtain CBC/BMP/Mag at follow up Please follow up on the following pending results: None  Home Health: Not indicated Equipment/Devices: Not indicated  Discharge Condition: Stable CODE STATUS: DNR/DNI  Follow-up Information     Joseph Pretty, MD. Schedule an appointment as soon as possible for a visit in 1 week(s).   Specialty: Internal Medicine Contact information: [address] [place] [address] [facility] course 86 year old M with PMH of A-fib on Eliquis, lumbar spinal stenosis status post decompression laminectomy 04/2021 and prostate cancer s/p prostatectomy in 2016 presenting with generalized weakness, poor p.o. intake and productive cough and admitted for community-acquired pneumonia and A-fib with RVR. Started on IV fluid, IV ceftriaxone and azithromycin.   The next day, patient's weakness and respiratory symptoms improved.  He received 3 doses of IV ceftriaxone and azithromycin in house.  He is discharged on p.o. Augmentin and azithromycin for 2 more days to complete 5 days course.    In regards to A-fib with RVR, echocardiogram without significant finding.  TSH normal.  He was continued on home metoprolol 25 mg twice daily.  He required a dose of p.o. Cardizem 30 mg, then IV Cardizem 10 mg push once for rate control.  Eventually, rate control achieved. We have increased his metoprolol to 50 mg daily on discharge.  He is already on Eliquis for anticoagulation.      See individual problem list below for more on hospital course.  Problems addressed during this hospitalization Problem  Cap (Community Acquired Pneumonia)  Paroxysmal Atrial Fibrillation  With Rvr (Hcc)  Aki (Acute Kidney Injury) (Hcc)  Thrombocytopenia (Hcc)  Generalized Weakness  Spinal Stenosis  Dehydration (Resolved)    Assessment and Plan: * CAP (community acquired pneumonia) Presents with general malaise, productive cough and poor p.o. intake.  No leukocytosis or fever.  CXR concerning for LLL infiltrate.  Blood cultures NGTD.  Pro-Cal slightly elevated.  Symptoms improved.  Antibiotics as above  Paroxysmal atrial fibrillation with RVR (HCC) Likely provoked by pneumonia.  Reports good compliance with metoprolol and Eliquis.  TSH within normal.  Echocardiogram without significant finding.  RVR resolved. -Increase metoprolol from 25 to 50 mg twice daily -Continue home Eliquis  AKI (acute kidney injury) ([place]) Recent Labs    04/08/21 1026 04/28/21 1223 12/31/21 1110 01/01/22 0530 01/02/22 0444  BUN 16 12 28* 20 17  CREATININE 1.26 1.36* 1.31* 0.83 0.96  Initially felt to be CKD but seems like he had an AKI from poor p.o. intake in the setting of pneumonia and A-fib with RVR. -Recheck renal function in 1 to 2 weeks   Thrombocytopenia ([place]) Improved.  Recheck CBC at follow-up  Generalized weakness Resolved  Spinal stenosis S/p decompression and laminectomy in 04/2021.  No need identified by therapy  Dehydration-resolved as of 01/02/2022 Has poor p.o. intake, hemoconcentration and ketonuria.  Resolved with IV fluid hydration   Vital signs Vitals:   01/01/22 1256 01/01/22 1711 01/01/22 2100 01/02/22 0619  BP: 126/79 (!) 129/93 121/79 123/84  Pulse: 97  (!) 108 90  Temp: 99.2 F (37.3 C)  99.4 F (37.4 C) 97.8 F (36.6 C)  Resp: '20  16 16  '$ Height:  Weight:      SpO2: 91%  93% 93%  TempSrc: Oral  Oral Oral  BMI (Calculated):         Discharge exam  GENERAL: No apparent distress.  Nontoxic. HEENT: MMM.  Vision and hearing grossly intact.  NECK: Supple.  No apparent JVD.  RESP:  No IWOB.  Fair aeration bilaterally. CVS:  RRR. Heart sounds  normal.  ABD/GI/GU: BS+. Abd soft, NTND.  MSK/EXT:  Moves extremities. No apparent deformity. No edema.  SKIN: no apparent skin lesion or wound NEURO: Awake and alert. Oriented appropriately.  No apparent focal neuro deficit. PSYCH: Calm. Normal affect.  Discharge Instructions Discharge Instructions     Call MD for:  difficulty breathing, headache or visual disturbances   Complete by: As directed    Call MD for:  extreme fatigue   Complete by: As directed    Call MD for:  persistant dizziness or light-headedness   Complete by: As directed    Diet general   Complete by: As directed    Discharge instructions   Complete by: As directed    It has been a pleasure taking care of you!  You were hospitalized due to pneumonia and atrial fibrillation.  You have been treated with antibiotic for pneumonia.  We are discharging you on more antibiotics to complete treatment course.  We have increased your metoprolol to 50 mg twice daily to better control your atrial fibrillation.  Please review your new medication list and the directions on your medications before you take them.  Follow-up with your primary care doctor in 1 to 2 weeks or sooner if needed.  Follow-up with your cardiologist in 2 to 3 weeks or sooner if needed.   Take care,   Increase activity slowly   Complete by: As directed       Allergies as of 01/02/2022       Reactions   Poison Ivy Extract Rash   Tetracycline Hcl Rash        Medication List     STOP taking these medications    dextromethorphan-guaiFENesin 30-600 MG 12hr tablet Commonly known as: Lem[place] DM       TAKE these medications    amoxicillin-clavulanate 875-125 MG tablet Commonly known as: AUGMENTIN Take 1 tablet by mouth 2 (two) times daily for 2 days.   apixaban 5 MG Tabs tablet Commonly known as: ELIQUIS Take 1 tablet (5 mg total) by mouth 2 (two) times daily.   azithromycin 250 MG tablet Commonly known as: ZITHROMAX Take 1 tablet (250  mg total) by mouth daily for 2 days. Start taking on: Jan 03, 2022   cetirizine 10 MG tablet Commonly known as: ZYRTEC Take 10 mg by mouth daily.   fluticasone 50 MCG/ACT nasal spray Commonly known as: FLONASE Place into both nostrils.   guaiFENesin 600 MG 12 hr tablet Commonly known as: Mucinex Take 1 tablet (600 mg total) by mouth 2 (two) times daily for 5 days.   ketotifen 0.025 % ophthalmic solution Commonly known as: ZADITOR Place 1 drop into both eyes daily as needed (itchy eyes).   metoprolol tartrate 50 MG tablet Commonly known as: LOPRESSOR Take 1 tablet (50 mg total) by mouth 2 (two) times daily. What changed:  medication strength how much to take        Consultations: None  Procedures/Studies:   DG Chest 1 View  Result Date: 12/31/2021 CLINICAL DATA:  Possible pneumonia EXAM: CHEST  1 VIEW COMPARISON:  11/24/2010 FINDINGS: Left lower  lobe airspace disease concerning for atelectasis versus pneumonia. no pleural effusion or pneumothorax. Heart and mediastinal contours are unremarkable. No acute osseous abnormality. IMPRESSION: 1. Left lower lobe airspace disease concerning for atelectasis versus pneumonia. Electronically Signed   By: Kathreen Devoid M.D.   On: 12/31/2021 11:35   ECHOCARDIOGRAM COMPLETE  Result Date: 01/01/2022    ECHOCARDIOGRAM REPORT   Patient Name:   Joseph [name] Date of Exam: 01/01/2022 Medical Rec #:  [id number]   Height:       66.0 in Accession #:    [id number]  Weight:       153.0 lb Date of Birth:  Oct 02, 1935    BSA:          1.785 m Patient Age:    86 years    BP:           126/79 mmHg Patient Gender: M           HR:           95 bpm. Exam Location:  Inpatient Procedure: 2D Echo, Cardiac Doppler and Color Doppler Indications:    Afib  History:        Patient has no prior history of Echocardiogram examinations.                 Arrythmias:Atrial Fibrillation; Risk Factors:Hypertension.  Sonographer:    Joette Catching RCS Referring Phys: [phone] Jerald Villalona  T Analaya Hoey IMPRESSIONS  1. Left ventricular ejection fraction, by estimation, is 55 to 60%. The left ventricle has normal function. The left ventricle has no regional wall motion abnormalities. Left ventricular diastolic parameters are indeterminate.  2. Right ventricular systolic function is normal. The right ventricular size is mildly enlarged. Tricuspid regurgitation signal is inadequate for assessing PA pressure. The estimated right ventricular systolic pressure is 32.1 mmHg.  3. Left atrial size was mildly dilated.  4. Right atrial size was mildly dilated.  5. The mitral valve is degenerative. Mild mitral valve regurgitation. No evidence of mitral stenosis. Moderate mitral annular calcification.  6. The aortic valve is grossly normal. There is mild calcification of the aortic valve. There is moderate thickening of the aortic valve. Aortic valve regurgitation is not visualized. Aortic valve sclerosis is present, with no evidence of aortic valve stenosis.  7. The inferior vena cava is normal in size with <50% respiratory variability, suggesting right atrial pressure of 8 mmHg. FINDINGS  Left Ventricle: Left ventricular ejection fraction, by estimation, is 55 to 60%. The left ventricle has normal function. The left ventricle has no regional wall motion abnormalities. The left ventricular internal cavity size was normal in size. There is  no left ventricular hypertrophy. Left ventricular diastolic parameters are indeterminate. Right Ventricle: The right ventricular size is mildly enlarged. No increase in right ventricular wall thickness. Right ventricular systolic function is normal. Tricuspid regurgitation signal is inadequate for assessing PA pressure. The tricuspid regurgitant velocity is 2.42 m/s, and with an assumed right atrial pressure of 8 mmHg, the estimated right ventricular systolic pressure is 22.4 mmHg. Left Atrium: Left atrial size was mildly dilated. Right Atrium: Right atrial size was mildly dilated.  Pericardium: There is no evidence of pericardial effusion. Mitral Valve: The mitral valve is degenerative in appearance. Moderate mitral annular calcification. Mild mitral valve regurgitation. No evidence of mitral valve stenosis. Tricuspid Valve: The tricuspid valve is normal in structure. Tricuspid valve regurgitation is not demonstrated. No evidence of tricuspid stenosis. Aortic Valve: The aortic valve is grossly normal. There is mild calcification  of the aortic valve. There is moderate thickening of the aortic valve. Aortic valve regurgitation is not visualized. Aortic valve sclerosis is present, with no evidence of aortic valve stenosis. Aortic valve mean gradient measures 2.0 mmHg. Aortic valve peak gradient measures 3.6 mmHg. Aortic valve area, by VTI measures 1.57 cm. Pulmonic Valve: The pulmonic valve was normal in structure. Pulmonic valve regurgitation is trivial. No evidence of pulmonic stenosis. Aorta: The aortic root is normal in size and structure. Venous: The inferior vena cava is normal in size with less than 50% respiratory variability, suggesting right atrial pressure of 8 mmHg. IAS/Shunts: No atrial level shunt detected by color flow Doppler.  LEFT VENTRICLE PLAX 2D LVIDd:         3.50 cm     Diastology LVIDs:         2.50 cm     LV e' medial:    7.40 cm/s LV PW:         0.90 cm     LV E/e' medial:  13.0 LV IVS:        1.00 cm     LV e' lateral:   8.49 cm/s LVOT diam:     2.00 cm     LV E/e' lateral: 11.3 LV SV:         22 LV SV Index:   12 LVOT Area:     3.14 cm  LV Volumes (MOD) LV vol d, MOD A2C: 54.8 ml LV vol d, MOD A4C: 36.4 ml LV vol s, MOD A2C: 26.0 ml LV vol s, MOD A4C: 15.4 ml LV SV MOD A2C:     28.8 ml LV SV MOD A4C:     36.4 ml LV SV MOD BP:      24.2 ml RIGHT VENTRICLE            IVC RV Basal diam:  3.90 cm    IVC diam: 2.00 cm RV Mid diam:    3.20 cm RV S prime:     9.03 cm/s LEFT ATRIUM             Index        RIGHT ATRIUM           Index LA diam:        3.00 cm 1.68 cm/m   RA  Area:     20.60 cm LA Vol (A2C):   36.8 ml 20.62 ml/m  RA Volume:   58.40 ml  32.72 ml/m LA Vol (A4C):   44.7 ml 25.05 ml/m LA Biplane Vol: 41.6 ml 23.31 ml/m  AORTIC VALVE                    PULMONIC VALVE AV Area (Vmax):    1.75 cm     PV Vmax:          0.88 m/s AV Area (Vmean):   1.61 cm     PV Peak grad:     3.1 mmHg AV Area (VTI):     1.57 cm     PR End Diast Vel: 15.52 msec AV Vmax:           95.00 cm/s AV Vmean:          65.900 cm/s AV VTI:            0.141 m AV Peak Grad:      3.6 mmHg AV Mean Grad:      2.0 mmHg LVOT Vmax:  52.90 cm/s LVOT Vmean:        33.800 cm/s LVOT VTI:          0.070 m LVOT/AV VTI ratio: 0.50  AORTA Ao Root diam: 3.60 cm Ao Asc diam:  2.90 cm MITRAL VALVE               TRICUSPID VALVE MV Area (PHT): 7.16 cm    TR Peak grad:   23.4 mmHg MV Decel Time: 106 msec    TR Vmax:        242.00 cm/s MR Peak grad: 66.3 mmHg MR Vmax:      407.00 cm/s  SHUNTS MV E velocity: 96.00 cm/s  Systemic VTI:  0.07 m                            Systemic Diam: 2.00 cm Cherlynn Kaiser MD Electronically signed by Cherlynn Kaiser MD Signature Date/Time: 01/01/2022/4:44:51 PM    Final        The results of significant diagnostics from this hospitalization (including imaging, microbiology, ancillary and laboratory) are listed below for reference.     Microbiology: Recent Results (from the past 240 hour(s))  Resp Panel by RT-PCR (Flu A&B, Covid) Anterior Nasal Swab     Status: None   Collection Time: 12/31/21 11:10 AM   Specimen: Anterior Nasal Swab  Result Value Ref Range Status   SARS Coronavirus 2 by RT PCR NEGATIVE NEGATIVE Final    Comment: (NOTE) SARS-CoV-2 target nucleic acids are NOT DETECTED.  The SARS-CoV-2 RNA is generally detectable in upper respiratory specimens during the acute phase of infection. The lowest concentration of SARS-CoV-2 viral copies this assay can detect is 138 copies/mL. A negative result does not preclude SARS-Cov-2 infection and should not be  used as the sole basis for treatment or other patient management decisions. A negative result may occur with  improper specimen collection/handling, submission of specimen other than nasopharyngeal swab, presence of viral mutation(s) within the areas targeted by this assay, and inadequate number of viral copies(<138 copies/mL). A negative result must be combined with clinical observations, patient history, and epidemiological information. The expected result is Negative.  Fact Sheet for Patients:  EntrepreneurPulse.com.au  Fact Sheet for Healthcare Providers:  IncredibleEmployment.be  This test is no t yet approved or cleared by the [place] FDA and  has been authorized for detection and/or diagnosis of SARS-CoV-2 by FDA under an Emergency Use Authorization (EUA). This EUA will remain  in effect (meaning this test can be used) for the duration of the COVID-19 declaration under Section 564(b)(1) of the Act, 21 U.S.C.section 360bbb-3(b)(1), unless the authorization is terminated  or revoked sooner.       Influenza A by PCR NEGATIVE NEGATIVE Final   Influenza B by PCR NEGATIVE NEGATIVE Final    Comment: (NOTE) The Xpert Xpress SARS-CoV-2/FLU/RSV plus assay is intended as an aid in the diagnosis of influenza from Nasopharyngeal swab specimens and should not be used as a sole basis for treatment. Nasal washings and aspirates are unacceptable for Xpert Xpress SARS-CoV-2/FLU/RSV testing.  Fact Sheet for Patients: EntrepreneurPulse.com.au  Fact Sheet for Healthcare Providers: IncredibleEmployment.be  This test is not yet approved or cleared by the [place] FDA and has been authorized for detection and/or diagnosis of SARS-CoV-2 by FDA under an Emergency Use Authorization (EUA). This EUA will remain in effect (meaning this test can be used) for the duration of the COVID-19 declaration under Section  564(b)(1) of the Act, 21 U.S.C. section 360bbb-3(b)(1), unless the authorization is terminated or revoked.  Performed at [facility], [address] [place], [place] [address]   SARS Coronavirus 2 by RT PCR (hospital order, performed in [facility] hospital lab) *cepheid single result test*     Status: None   Collection Time: 12/31/21 12:00 PM  Result Value Ref Range Status   SARS Coronavirus 2 by RT PCR NEGATIVE NEGATIVE Final    Comment: (NOTE) SARS-CoV-2 target nucleic acids are NOT DETECTED.  The SARS-CoV-2 RNA is generally detectable in upper and lower respiratory specimens during the acute phase of infection. The lowest concentration of SARS-CoV-2 viral copies this assay can detect is 250 copies / mL. A negative result does not preclude SARS-CoV-2 infection and should not be used as the sole basis for treatment or other patient management decisions.  A negative result may occur with improper specimen collection / handling, submission of specimen other than nasopharyngeal swab, presence of viral mutation(s) within the areas targeted by this assay, and inadequate number of viral copies (<250 copies / mL). A negative result must be combined with clinical observations, patient history, and epidemiological information.  Fact Sheet for Patients:   https://www.patel.info/  Fact Sheet for Healthcare Providers: https://hall.com/  This test is not yet approved or  cleared by the [place] FDA and has been authorized for detection and/or diagnosis of SARS-CoV-2 by FDA under an Emergency Use Authorization (EUA).  This EUA will remain in effect (meaning this test can be used) for the duration of the COVID-19 declaration under Section 564(b)(1) of the Act, 21 U.S.C. section 360bbb-3(b)(1), unless the authorization is terminated or revoked sooner.  Performed at [facility], [address], [place], [place] [address]   Blood Culture (routine x 2)     Status: None (Preliminary result)   Collection Time: 12/31/21 12:37 PM   Specimen: BLOOD  Result Value Ref Range Status   Specimen Description   Final    BLOOD RIGHT ANTECUBITAL Performed at [facility], [address] [place], [place] [address]    Special Requests   Final    Blood Culture adequate volume BOTTLES DRAWN AEROBIC AND ANAEROBIC Performed at [facility], [address] [place], [place] [address]    Culture   Final    NO GROWTH 2 DAYS Performed at [facility], [place] [address], [place], [place] [address]    Report Status PENDING  Incomplete     Labs:  CBC: Recent Labs  Lab 12/31/21 1110 12/31/21 1200 01/01/22 0530 01/02/22 0444  WBC 8.1 8.0 6.1 6.3  NEUTROABS  --  6.3  --   --   HGB 17.4* 17.4* 14.5 14.4  HCT 51.7 51.0 42.1 42.0  MCV 99.8 100.2* 101.2* 101.4*  PLT 157 162 120* 141*   BMP &GFR Recent Labs  Lab 12/31/21 1110 01/01/22 0530 01/02/22 0444  NA 135 137 136  K 4.5 4.1 4.1  CL 100 108 103  CO2 22 21* 27  GLUCOSE 126* 108* 104*  BUN 28* 20 17  CREATININE 1.31* 0.83 0.96  CALCIUM 9.6 8.2* 8.3*  MG  --  1.8 2.0  PHOS  --  2.4* 3.2   Estimated Creatinine Clearance: 51.7 mL/min (by C-G formula based on SCr of 0.96 mg/dL). Liver & Pancreas: Recent Labs  Lab 12/31/21 1223 01/01/22 0530 01/02/22 0444  AST 20  --   --   ALT 14  --   --  ALKPHOS 57  --   --   BILITOT 0.6  --   --   PROT 7.6  --   --   ALBUMIN 4.3 2.8* 2.8*   No results for input(s): LIPASE, AMYLASE in the last 168 hours. No results for input(s): AMMONIA in the last 168 hours. Diabetic: No results for input(s): HGBA1C in the last 72 hours. No results for input(s): GLUCAP in the last 168 hours. Cardiac Enzymes: No results for input(s): CKTOTAL, CKMB, CKMBINDEX, TROPONINI in the last 168 hours. No results for input(s): PROBNP in the last 8760  hours. Coagulation Profile: Recent Labs  Lab 12/31/21 1223  INR 1.2   Thyroid Function Tests: Recent Labs    12/31/21 1751  TSH 1.419   Lipid Profile: No results for input(s): CHOL, HDL, LDLCALC, TRIG, CHOLHDL, LDLDIRECT in the last 72 hours. Anemia Panel: No results for input(s): VITAMINB12, FOLATE, FERRITIN, TIBC, IRON, RETICCTPCT in the last 72 hours. Urine analysis:    Component Value Date/Time   COLORURINE BROWN (A) 12/31/2021 1310   APPEARANCEUR CLEAR 12/31/2021 1310   LABSPEC 1.038 (H) 12/31/2021 1310   PHURINE 5.5 12/31/2021 1310   GLUCOSEU NEGATIVE 12/31/2021 1310   HGBUR MODERATE (A) 12/31/2021 1310   BILIRUBINUR NEGATIVE 12/31/2021 1310   KETONESUR 40 (A) 12/31/2021 1310   PROTEINUR 100 (A) 12/31/2021 1310   UROBILINOGEN 0.2 11/24/2010 1215   NITRITE NEGATIVE 12/31/2021 1310   LEUKOCYTESUR NEGATIVE 12/31/2021 1310   Sepsis Labs: Invalid input(s): PROCALCITONIN, LACTICIDVEN   Time coordinating discharge: 45 minutes  SIGNED:  Mercy Riding, MD  [facility] 01/02/2022, 4:25 PM

## 2022-01-02 NOTE — Assessment & Plan Note (Signed)
Improved.  Recheck CBC at follow-up

## 2022-01-05 DIAGNOSIS — N182 Chronic kidney disease, stage 2 (mild): Secondary | ICD-10-CM | POA: Insufficient documentation

## 2022-01-05 LAB — CULTURE, BLOOD (ROUTINE X 2)
Culture: NO GROWTH
Special Requests: ADEQUATE

## 2022-01-05 NOTE — Progress Notes (Unsigned)
Cardiology Office Note   Date:  01/06/2022   ID:  Joseph [name], DOB 1935/10/09, MRN [id number]  PCP:  [name], MD  Cardiologist:   [name], MD   Chief Complaint  Patient presents with   Atrial Fibrillation       History of Present Illness: Joseph [name] is a 86 y.o. male who presents for follow up of atrial fib.  He had cardioversion in 2012 when he developed atrial fibrillation at the time of ruptured diverticulum. However, he had another recurrence of this in 2017.    He has been in chronic atrial fib.   He had back surgery since I last saw him.  This was just last week.  He is getting around slowly because of this.  Since I last saw him he was in the hospital with pneumonia.  I did review these records.  I do note that he had A-fib with RVR when he was in the hospital and had his metoprolol increased.  He mentions that he has been having black stools which she was having in the hospital.  He was not anemic at that time.  He says he does not typically get black stools.  He is not having any red blood.  He is still fatigued from his pneumonia.  He is not having any new shortness of breath, PND or orthopnea.  He has had no new cough fevers or chills.   Past Medical History:  Diagnosis Date   Arthritis    Atrial fibrillation ([place])    resolved with cardioversion   Complication of anesthesia    Dysrhythmia    Atrial Fibrilation   GERD (gastroesophageal reflux disease)    Headache    occasional   History of skin cancer    Hypertension    Pneumonia    PONV (postoperative nausea and vomiting)    Prostate cancer ([place])    Sigmoid diverticulitis April of 2012   with small perforation   Squamous cell carcinoma     Past Surgical History:  Procedure Laterality Date   CARDIOVERSION  01/07/2009   LASIK     LUMBAR LAMINECTOMY/DECOMPRESSION MICRODISCECTOMY N/A 05/06/2021   Procedure: Lumbar decompression and insitu fusion Lumbar four - Lumbar five;  Surgeon: Melina Schools, MD;  Location: [facility];  Service: Orthopedics;  Laterality: N/A;  3 hrs 3 C-Bed   LYMPHADENECTOMY Bilateral 01/17/2015   Procedure: PELVIC LYMPHADENECTOMY;  Surgeon: Alexis Frock, MD;  Location: [facility];  Service: Urology;  Laterality: Bilateral;   NASAL SEPTUM SURGERY     ROBOT ASSISTED LAPAROSCOPIC RADICAL PROSTATECTOMY N/A 01/17/2015   Procedure: ROBOTIC ASSISTED LAPAROSCOPIC RADICAL PROSTATECTOMY WITH INDOCYANINE GREEN DYE INJECTION;  Surgeon: Alexis Frock, MD;  Location: [facility];  Service: Urology;  Laterality: N/A;   SHOULDER SURGERY     x 2   TONSILLECTOMY     as a child     Current Outpatient Medications  Medication Sig Dispense Refill   apixaban (ELIQUIS) 5 MG TABS tablet Take 1 tablet (5 mg total) by mouth 2 (two) times daily. 180 tablet 3   cetirizine (ZYRTEC) 10 MG tablet Take 10 mg by mouth daily.     fluticasone (FLONASE) 50 MCG/ACT nasal spray Place into both nostrils.     guaiFENesin (MUCINEX) 600 MG 12 hr tablet Take 1 tablet (600 mg total) by mouth 2 (two) times daily for 5 days. 10 tablet 0   ketotifen (ZADITOR) 0.025 % ophthalmic solution Place 1 drop into both  eyes daily as needed (itchy eyes).     metoprolol tartrate (LOPRESSOR) 50 MG tablet Take 1 tablet (50 mg total) by mouth 2 (two) times daily. 180 tablet 1   No current facility-administered medications for this visit.    Allergies:   Poison ivy extract and Tetracycline hcl    ROS:  Please see the history of present illness.   Otherwise, review of systems are positive for none.   All other systems are reviewed and negative.    PHYSICAL EXAM: VS:  BP 108/76 (BP Location: Left Arm, Patient Position: Sitting, Cuff Size: Normal)   Pulse 88   Ht '5\' 6"'$  (1.676 m)   Wt 160 lb (72.6 kg)   BMI 25.82 kg/m  , BMI Body mass index is 25.82 kg/m.  GENERAL:  Well appearing NECK:  No jugular venous distention, waveform within normal limits, carotid upstroke brisk and symmetric, no bruits, no  thyromegaly LUNGS:  Clear to auscultation bilaterally CHEST:  Unremarkable HEART:  PMI not displaced or sustained,S1 and S2 within normal limits, no S3, no clicks, no rubs, no murmurs, irregular  ABD:  Flat, positive bowel sounds normal in frequency in pitch, no bruits, no rebound, no guarding, no midline pulsatile mass, no hepatomegaly, no splenomegaly EXT:  2 plus pulses throughout, no edema, no cyanosis no clubbing  EKG:  EKG is  not ordered today.  Recent Labs: 12/31/2021: ALT 14; TSH 1.419 01/02/2022: BUN 17; Creatinine, Ser 0.96; Hemoglobin 14.4; Magnesium 2.0; Platelets 141; Potassium 4.1; Sodium 136    Lipid Panel No results found for: CHOL, TRIG, HDL, CHOLHDL, VLDL, LDLCALC, LDLDIRECT    Wt Readings from Last 3 Encounters:  01/06/22 160 lb (72.6 kg)  12/31/21 153 lb (69.4 kg)  05/11/21 167 lb 9.6 oz (76 kg)      Other studies Reviewed: Additional studies/ records that were reviewed today include: Hospital records Review of the above records demonstrates: See elsewhere  ASSESSMENT AND PLAN:   ATRIAL FIB:     He tolerates anticoagulation. . Joseph [name] has a CHA2DS2 - VASc score of 2.  He is describing some black stools.  I am going to check a CBC and send him with some stool guaiac cards.  For now he will continue the metoprolol 50 mg twice daily and check his heart rate with pulse oximeter.  Have given him goals for his heart rate.   Current medicines are reviewed at length with the patient today.  The patient does not have concerns regarding medicines.  The following changes have been made: As above  Labs/ tests ordered today include:     Orders Placed This Encounter  Procedures   [place]ac Stool Card-TAKE HOME   CBC      Disposition:   FU with APP in 6 months  Signed, [name], MD  01/06/2022 11:52 AM    [place]

## 2022-01-06 ENCOUNTER — Ambulatory Visit: Payer: Medicare PPO | Admitting: Cardiology

## 2022-01-06 ENCOUNTER — Encounter: Payer: Self-pay | Admitting: Cardiology

## 2022-01-06 VITALS — BP 108/76 | HR 88 | Ht 66.0 in | Wt 160.0 lb

## 2022-01-06 DIAGNOSIS — N182 Chronic kidney disease, stage 2 (mild): Secondary | ICD-10-CM | POA: Diagnosis not present

## 2022-01-06 DIAGNOSIS — I482 Chronic atrial fibrillation, unspecified: Secondary | ICD-10-CM

## 2022-01-06 DIAGNOSIS — R195 Other fecal abnormalities: Secondary | ICD-10-CM

## 2022-01-06 LAB — CBC
Hematocrit: 42.8 % (ref 37.5–51.0)
Hemoglobin: 14.9 g/dL (ref 13.0–17.7)
MCH: 34.7 pg — ABNORMAL HIGH (ref 26.6–33.0)
MCHC: 34.8 g/dL (ref 31.5–35.7)
MCV: 100 fL — ABNORMAL HIGH (ref 79–97)
Platelets: 293 10*3/uL (ref 150–450)
RBC: 4.3 x10E6/uL (ref 4.14–5.80)
RDW: 13.1 % (ref 11.6–15.4)
WBC: 8.9 10*3/uL (ref 3.4–10.8)

## 2022-01-06 NOTE — Patient Instructions (Addendum)
Medication Instructions:  The current medical regimen is effective;  continue present plan and medications.  *If you need a refill on your cardiac medications before your next appointment, please call your pharmacy*   Lab Work: CBC, [place]- If you have labs (blood work) drawn today and your tests are completely normal, you will receive your results only by: [place] (if you have MyChart) OR A paper copy in the mail If you have any lab test that is abnormal or we need to change your treatment, we will call you to review the results.   Follow-Up: At [facility], you and your health needs are our priority.  As part of our continuing mission to provide you with exceptional heart care, we have created designated Provider Care Teams.  These Care Teams include your primary Cardiologist (physician) and Advanced Practice Providers (APPs -  Physician Assistants and Nurse Practitioners) who all work together to provide you with the care you need, when you need it.  We recommend signing up for the patient portal called "MyChart".  Sign up information is provided on this After Visit Summary.  MyChart is used to connect with patients for Virtual Visits (Telemedicine).  Patients are able to view lab/test results, encounter notes, upcoming appointments, etc.  Non-urgent messages can be sent to your provider as well.   To learn more about what you can do with MyChart, go to [email or web address].    Your next appointment:   6 month(s)  The format for your next appointment:   In Person  Provider:   [name], MD

## 2022-01-15 DIAGNOSIS — I480 Paroxysmal atrial fibrillation: Secondary | ICD-10-CM | POA: Diagnosis not present

## 2022-01-15 DIAGNOSIS — J189 Pneumonia, unspecified organism: Secondary | ICD-10-CM | POA: Diagnosis not present

## 2022-01-20 ENCOUNTER — Ambulatory Visit: Payer: Medicare PPO | Admitting: Cardiology

## 2022-02-08 DIAGNOSIS — M545 Low back pain, unspecified: Secondary | ICD-10-CM | POA: Diagnosis not present

## 2022-02-24 DIAGNOSIS — M25559 Pain in unspecified hip: Secondary | ICD-10-CM | POA: Diagnosis not present

## 2022-02-24 DIAGNOSIS — Z6825 Body mass index (BMI) 25.0-25.9, adult: Secondary | ICD-10-CM | POA: Diagnosis not present

## 2022-02-25 DIAGNOSIS — M545 Low back pain, unspecified: Secondary | ICD-10-CM | POA: Diagnosis not present

## 2022-02-25 DIAGNOSIS — M6281 Muscle weakness (generalized): Secondary | ICD-10-CM | POA: Diagnosis not present

## 2022-03-02 ENCOUNTER — Encounter (HOSPITAL_COMMUNITY): Payer: Self-pay

## 2022-03-02 ENCOUNTER — Emergency Department (HOSPITAL_COMMUNITY)
Admission: EM | Admit: 2022-03-02 | Discharge: 2022-03-03 | Disposition: A | Discharge status: Home / Self Care | Payer: Medicare PPO | Attending: Emergency Medicine | Admitting: Emergency Medicine

## 2022-03-02 ENCOUNTER — Emergency Department (HOSPITAL_COMMUNITY): Payer: Medicare PPO

## 2022-03-02 DIAGNOSIS — Z79899 Other long term (current) drug therapy: Secondary | ICD-10-CM | POA: Insufficient documentation

## 2022-03-02 DIAGNOSIS — M4316 Spondylolisthesis, lumbar region: Secondary | ICD-10-CM | POA: Diagnosis not present

## 2022-03-02 DIAGNOSIS — M545 Low back pain, unspecified: Secondary | ICD-10-CM | POA: Diagnosis not present

## 2022-03-02 DIAGNOSIS — S064X0A Epidural hemorrhage without loss of consciousness, initial encounter: Secondary | ICD-10-CM | POA: Insufficient documentation

## 2022-03-02 DIAGNOSIS — Z7901 Long term (current) use of anticoagulants: Secondary | ICD-10-CM | POA: Diagnosis not present

## 2022-03-02 DIAGNOSIS — S064XAA Epidural hemorrhage with loss of consciousness status unknown, initial encounter: Secondary | ICD-10-CM

## 2022-03-02 DIAGNOSIS — R0902 Hypoxemia: Secondary | ICD-10-CM | POA: Diagnosis not present

## 2022-03-02 DIAGNOSIS — I4891 Unspecified atrial fibrillation: Secondary | ICD-10-CM | POA: Diagnosis not present

## 2022-03-02 DIAGNOSIS — X58XXXA Exposure to other specified factors, initial encounter: Secondary | ICD-10-CM | POA: Insufficient documentation

## 2022-03-02 DIAGNOSIS — R200 Anesthesia of skin: Secondary | ICD-10-CM | POA: Diagnosis not present

## 2022-03-02 DIAGNOSIS — Z8546 Personal history of malignant neoplasm of prostate: Secondary | ICD-10-CM | POA: Insufficient documentation

## 2022-03-02 DIAGNOSIS — M25552 Pain in left hip: Secondary | ICD-10-CM | POA: Diagnosis not present

## 2022-03-02 DIAGNOSIS — Z85820 Personal history of malignant melanoma of skin: Secondary | ICD-10-CM | POA: Insufficient documentation

## 2022-03-02 DIAGNOSIS — S0990XA Unspecified injury of head, initial encounter: Secondary | ICD-10-CM | POA: Diagnosis present

## 2022-03-02 DIAGNOSIS — I10 Essential (primary) hypertension: Secondary | ICD-10-CM | POA: Insufficient documentation

## 2022-03-02 DIAGNOSIS — M549 Dorsalgia, unspecified: Secondary | ICD-10-CM | POA: Diagnosis not present

## 2022-03-02 MED ORDER — GADOBUTROL 1 MMOL/ML IV SOLN
7.0000 mL | Freq: Once | INTRAVENOUS | Status: AC | PRN
  Administered 2022-03-02: 7 mL via INTRAVENOUS

## 2022-03-02 MED ORDER — HYDROMORPHONE HCL 1 MG/ML IJ SOLN
1.0000 mg | Freq: Once | INTRAMUSCULAR | Status: AC
Start: 2022-03-02 — End: 2022-03-02
  Administered 2022-03-02: 1 mg via INTRAMUSCULAR
  Filled 2022-03-02: qty 1

## 2022-03-02 NOTE — ED Triage Notes (Signed)
Pt arrived via EMS from independent living. C/o left lower back pain, radiating down leg. Denies any trauma to area. No falls. States has been seen with no dx.

## 2022-03-02 NOTE — ED Provider Notes (Signed)
[facility] DEPT Provider Note   CSN: [id number] Arrival date & time: 03/02/22  1659     History {Add pertinent medical, surgical, social history, OB history to HPI:1} Chief Complaint  Patient presents with   Back Pain    Joseph [name] is a 86 y.o. male.  HPI     86 year old male with a history of atrial fibrillation on Eliquis, hypertension, prostate cancer, previous L4-L5 decompression in situ fusion in September 2022    She was seen July 3 by Dr. Rolena Infante, who noted that he been having increased back, buttock and anterior thigh pain right worse than left for approximately 4 weeks and had imaging that demonstrated foraminal stenosis at L3-L4 but no progression of his degenerative scoliosis.  At that time, Dr. Rolena Infante believed he was having L3 radicular pain, recommended a Medrol Dosepak, aquatic therapy and discussed that he did not improve they would recommend a new MRI and possible injection therapy with worst-case being considering a spinal cord stimulator to help with pain.  Past Medical History:  Diagnosis Date   Arthritis    Atrial fibrillation ([place])    resolved with cardioversion   Complication of anesthesia    Dysrhythmia    Atrial Fibrilation   GERD (gastroesophageal reflux disease)    Headache    occasional   History of skin cancer    Hypertension    Pneumonia    PONV (postoperative nausea and vomiting)    Prostate cancer ([place])    Sigmoid diverticulitis April of 2012   with small perforation   Squamous cell carcinoma      Home Medications Prior to Admission medications   Medication Sig Start Date End Date Taking? Authorizing Provider  apixaban (ELIQUIS) 5 MG TABS tablet Take 1 tablet (5 mg total) by mouth 2 (two) times daily. 05/11/21   [name], MD  cetirizine (ZYRTEC) 10 MG tablet Take 10 mg by mouth daily.    [provider]  fluticasone (FLONASE) 50 MCG/ACT nasal spray Place into both nostrils. 10/01/21    [provider]  ketotifen (ZADITOR) 0.025 % ophthalmic solution Place 1 drop into both eyes daily as needed (itchy eyes).    [provider]  metoprolol tartrate (LOPRESSOR) 50 MG tablet Take 1 tablet (50 mg total) by mouth 2 (two) times daily. 01/02/22   Mercy Riding, MD      Allergies    Poison ivy extract and Tetracycline hcl    Review of Systems   Review of Systems  Physical Exam Updated Vital Signs BP 123/71 (BP Location: Right Arm)   Pulse 84   Temp 97.7 F (36.5 C) (Oral)   Resp 18   SpO2 94%  Physical Exam  ED Results / Procedures / Treatments   Labs (all labs ordered are listed, but only abnormal results are displayed) Labs Reviewed - No data to display  EKG None  Radiology No results found.  Procedures Procedures  {Document cardiac monitor, telemetry assessment procedure when appropriate:1}  Medications Ordered in ED Medications - No data to display  ED Course/ Medical Decision Making/ A&P                           Medical Decision Making  ***  {Document critical care time when appropriate:1} {Document review of labs and clinical decision tools ie heart score, Chads2Vasc2 etc:1}  {Document your independent review of radiology images, and any outside records:1} {Document your discussion  with family members, caretakers, and with consultants:1} {Document social determinants of health affecting pt's care:1} {Document your decision making why or why not admission, treatments were needed:1} Final Clinical Impression(s) / ED Diagnoses Final diagnoses:  None    Rx / DC Orders ED Discharge Orders     None

## 2022-03-03 MED ORDER — OXYCODONE HCL 5 MG PO TABS: 5.0000 mg | ORAL_TABLET | ORAL | 0 refills | Status: DC | PRN

## 2022-03-03 MED ORDER — LIDOCAINE 5 % EX PTCH
1.0000 | MEDICATED_PATCH | CUTANEOUS | 0 refills | Status: DC
Start: 2022-03-03 — End: 2022-03-12

## 2022-03-03 MED ORDER — OXYCODONE HCL 5 MG PO TABS
5.0000 mg | ORAL_TABLET | Freq: Once | ORAL | Status: AC
  Administered 2022-03-03: 5 mg via ORAL
  Filled 2022-03-03: qty 1

## 2022-03-03 MED ORDER — ONDANSETRON 4 MG PO TBDP
4.0000 mg | ORAL_TABLET | Freq: Once | ORAL | Status: AC
Start: 2022-03-03 — End: 2022-03-03
  Administered 2022-03-03: 4 mg via ORAL
  Filled 2022-03-03: qty 1

## 2022-03-03 NOTE — Discharge Instructions (Addendum)
Hold your eliquis (apixaban) for now until instructed to restart by your spine doctor and discuss this with Cardiology.

## 2022-03-03 NOTE — ED Notes (Signed)
Pt ambulated w/ hand held assist. Pt steady. Slight c/o back pain.

## 2022-03-05 ENCOUNTER — Ambulatory Visit: Payer: Self-pay | Admitting: Orthopedic Surgery

## 2022-03-05 ENCOUNTER — Encounter (HOSPITAL_COMMUNITY): Payer: Self-pay | Admitting: Orthopedic Surgery

## 2022-03-05 ENCOUNTER — Other Ambulatory Visit: Payer: Self-pay

## 2022-03-05 NOTE — Progress Notes (Signed)
Patient and his wife voiced understanding of new arrival time of 39 on 7/31.

## 2022-03-05 NOTE — Anesthesia Preprocedure Evaluation (Signed)
Anesthesia Evaluation  Patient identified by MRN, date of birth, ID band Patient awake    Reviewed: Allergy & Precautions, NPO status , Patient's Chart, lab work & pertinent test results, reviewed documented beta blocker date and time   History of Anesthesia Complications (+) PONV and history of anesthetic complications  Airway Mallampati: III  TM Distance: >3 FB Neck ROM: Full    Dental  (+) Teeth Intact, Dental Advisory Given   Pulmonary neg pulmonary ROS,    Pulmonary exam normal breath sounds clear to auscultation       Cardiovascular hypertension, Pt. on home beta blockers and Pt. on medications Normal cardiovascular exam+ dysrhythmias Atrial Fibrillation  Rhythm:Regular Rate:Normal  EKG 12/31/2021 (in setting of ED visit for pneumonia): A-fib with RVR.  Rate 131.  TTE 01/01/2022: 1. Left ventricular ejection fraction, by estimation, is 55 to 60%. The  left ventricle has normal function. The left ventricle has no regional  wall motion abnormalities. Left ventricular diastolic parameters are  indeterminate.  2. Right ventricular systolic function is normal. The right ventricular  size is mildly enlarged. Tricuspid regurgitation signal is inadequate for  assessing PA pressure. The estimated right ventricular systolic pressure  is 37.8 mmHg.  3. Left atrial size was mildly dilated.  4. Right atrial size was mildly dilated.  5. The mitral valve is degenerative. Mild mitral valve regurgitation. No  evidence of mitral stenosis. Moderate mitral annular calcification.  6. The aortic valve is grossly normal. There is mild calcification of the  aortic valve. There is moderate thickening of the aortic valve. Aortic  valve regurgitation is not visualized. Aortic valve sclerosis is present,  with no evidence of aortic valve  stenosis.  7. The inferior vena cava is normal in size with <50% respiratory  variability, suggesting  right atrial pressure of 8 mmHg.    Neuro/Psych  Headaches, negative psych ROS   GI/Hepatic Neg liver ROS, GERD  ,  Endo/Other  negative endocrine ROS  Renal/GU Renal InsufficiencyRenal disease  negative genitourinary   Musculoskeletal  (+) Arthritis ,   Abdominal   Peds  Hematology negative hematology ROS (+)   Anesthesia Other Findings   Reproductive/Obstetrics                           Anesthesia Physical Anesthesia Plan  ASA: 3  Anesthesia Plan: General   Post-op Pain Management: Tylenol PO (pre-op)*   Induction: Intravenous  PONV Risk Score and Plan: 3 and Dexamethasone, Ondansetron and Treatment may vary due to age or medical condition  Airway Management Planned: Oral ETT  Additional Equipment:   Intra-op Plan:   Post-operative Plan: Extubation in OR  Informed Consent: I have reviewed the patients History and Physical, chart, labs and discussed the procedure including the risks, benefits and alternatives for the proposed anesthesia with the patient or authorized representative who has indicated his/her understanding and acceptance.     Dental advisory given  Plan Discussed with: CRNA  Anesthesia Plan Comments: (   )       Anesthesia Quick Evaluation

## 2022-03-05 NOTE — Pre-Procedure Instructions (Signed)
   Joseph [name]  03/05/2022    Joseph [name] procedure is scheduled on Monday, 03/08/22 at 7:30 AM.  Report to [facility] Admitting at 5:30 AM.  Call this number if you have problems the morning of surgery: [phone]   Remember:  Joseph [name] is not to eat food after midnight Sunday, 03/07/22  Joseph [name] may drink clear liquids until 3 hours prior to his surgery start time. (Joseph [name] is to call the Operating Room desk at 8 AM on Monday to get the surgery start time.  Clear liquids allowed are:  Water, Juice (No red color; non-citric and without pulp; diabetics please choose diet or no sugar options), Carbonated beverages (diabetics please choose diet or no sugar options), Clear Tea (No creamer, milk, or cream, including half & half and powdered creamer), Black Coffee Only (No creamer, milk or cream, including half & half and powdered creamer), Plain Jell-O Only (No red color; diabetics please choose no sugar options), and Clear Sports drink (No red color; diabetics please choose diet or no sugar options)    Take these medicines the morning of surgery with A SIP OF WATER: Metoprolol, Oxycodone - prn    Do not wear jewelry.  Do not wear lotions, powders, cologne or deodorant.  Men may shave face and neck.  Do not bring valuables to the hospital.            Shower instructions given to patient and his wife.    [facility] is not responsible for any belongings or valuables.  Contacts, dentures or bridgework may not be worn into surgery.  Leave your suitcase in the car.  After surgery it may be brought to your room.  For patients admitted to the hospital, discharge time will be determined by your treatment team.       6

## 2022-03-05 NOTE — Progress Notes (Addendum)
Called pt earlier today and his wife stated that the ambulance had just been to their home because Mr. Maffei was having severe back pain. She states that they live at [facility] complex independently but they have moved into the Care facility until pt's surgery. She asked that I call her back later because they were trying to get into with Dr. Rolena Infante about this pain Mr. Leeson is having. I called her back this afternoon and she states she never got hold of anyone at Dr. Rolena Infante' office but states Mr. Crochet has gotten some relief at this time. He will stay at the Care facility where he will have nursing care. Pt has hx of A-fib and is on Eliquis. He states his last dose was 03/03/22. Dr. Percival Spanish is his cardiologist, last office visit was 01/06/22. Pt states he is not diabetic. I have attempted to contact the nurse at [facility] but now waiting for a return call.   Shower instructions given to pt and his wife.   See anesthesia PA note.  Pt is an "add-on" for Dr. Rolena Infante on Monday. I instructed Mrs. Lipkin to call the OR desk at [phone] at 8 AM Monday for surgery start time. I told her once she finds out what the surgery start time is, pt is to arrive 2.5 hours prior to start time. She voiced understanding.

## 2022-03-05 NOTE — Progress Notes (Signed)
Spoke with Olivia Mackie, LPN at [facility] facility. I have faxed her the instructions for Joseph [name]' surgery on Monday. Pt has been instructed to hold his Turmeric and any Aspirin products.

## 2022-03-05 NOTE — Progress Notes (Signed)
Anesthesia Chart Review:  Follows cardiology for history of A-fib on Eliquis.  Recent echo 01/01/2022 showed EF 55 to 60%, mild MR.  Eliquis currently on hold since 03/02/2022 when patient presented to the ED with severe lower back pain and MRI lumbar spine was concerning for subacute epidural hematoma.  Patient will need day of surgery labs and evaluation.  EKG 12/31/2021 (in setting of ED visit for pneumonia): A-fib with RVR.  Rate 131.  TTE 01/01/2022:  1. Left ventricular ejection fraction, by estimation, is 55 to 60%. The  left ventricle has normal function. The left ventricle has no regional  wall motion abnormalities. Left ventricular diastolic parameters are  indeterminate.   2. Right ventricular systolic function is normal. The right ventricular  size is mildly enlarged. Tricuspid regurgitation signal is inadequate for  assessing PA pressure. The estimated right ventricular systolic pressure  is 93.2 mmHg.   3. Left atrial size was mildly dilated.   4. Right atrial size was mildly dilated.   5. The mitral valve is degenerative. Mild mitral valve regurgitation. No  evidence of mitral stenosis. Moderate mitral annular calcification.   6. The aortic valve is grossly normal. There is mild calcification of the  aortic valve. There is moderate thickening of the aortic valve. Aortic  valve regurgitation is not visualized. Aortic valve sclerosis is present,  with no evidence of aortic valve  stenosis.   7. The inferior vena cava is normal in size with <50% respiratory  variability, suggesting right atrial pressure of 8 mmHg.     Wynonia Musty [facility] Stay [facility]/Anesthesiology Phone [phone] 03/05/2022 11:03 AM

## 2022-03-08 ENCOUNTER — Other Ambulatory Visit: Payer: Self-pay

## 2022-03-08 ENCOUNTER — Ambulatory Visit (HOSPITAL_COMMUNITY): Payer: Medicare PPO

## 2022-03-08 ENCOUNTER — Inpatient Hospital Stay (HOSPITAL_COMMUNITY)
Admission: AD | Admit: 2022-03-08 | Discharge: 2022-03-12 | DRG: 518 | Disposition: A | Discharge status: Skilled Nursing Facility | Payer: Medicare PPO | Attending: Orthopedic Surgery | Admitting: Orthopedic Surgery

## 2022-03-08 ENCOUNTER — Ambulatory Visit (HOSPITAL_COMMUNITY): Payer: Medicare PPO | Admitting: Physician Assistant

## 2022-03-08 ENCOUNTER — Ambulatory Visit (HOSPITAL_BASED_OUTPATIENT_CLINIC_OR_DEPARTMENT_OTHER): Payer: Medicare PPO | Admitting: Physician Assistant

## 2022-03-08 ENCOUNTER — Encounter (HOSPITAL_COMMUNITY)
Admission: AD | Disposition: A | Discharge status: Skilled Nursing Facility | Payer: Self-pay | Source: Home / Self Care | Attending: Orthopedic Surgery

## 2022-03-08 ENCOUNTER — Encounter (HOSPITAL_COMMUNITY): Payer: Self-pay | Admitting: Orthopedic Surgery

## 2022-03-08 DIAGNOSIS — M48061 Spinal stenosis, lumbar region without neurogenic claudication: Secondary | ICD-10-CM | POA: Diagnosis present

## 2022-03-08 DIAGNOSIS — S064X0A Epidural hemorrhage without loss of consciousness, initial encounter: Secondary | ICD-10-CM | POA: Diagnosis not present

## 2022-03-08 DIAGNOSIS — I472 Ventricular tachycardia, unspecified: Secondary | ICD-10-CM | POA: Diagnosis not present

## 2022-03-08 DIAGNOSIS — M4805 Spinal stenosis, thoracolumbar region: Secondary | ICD-10-CM | POA: Diagnosis not present

## 2022-03-08 DIAGNOSIS — R2689 Other abnormalities of gait and mobility: Secondary | ICD-10-CM | POA: Diagnosis not present

## 2022-03-08 DIAGNOSIS — G96198 Other disorders of meninges, not elsewhere classified: Secondary | ICD-10-CM | POA: Diagnosis not present

## 2022-03-08 DIAGNOSIS — I4891 Unspecified atrial fibrillation: Secondary | ICD-10-CM

## 2022-03-08 DIAGNOSIS — F0390 Unspecified dementia without behavioral disturbance: Secondary | ICD-10-CM | POA: Diagnosis not present

## 2022-03-08 DIAGNOSIS — S24104A Unspecified injury at T11-T12 level of thoracic spinal cord, initial encounter: Secondary | ICD-10-CM | POA: Diagnosis not present

## 2022-03-08 DIAGNOSIS — S24104S Unspecified injury at T11-T12 level of thoracic spinal cord, sequela: Secondary | ICD-10-CM

## 2022-03-08 DIAGNOSIS — Z981 Arthrodesis status: Secondary | ICD-10-CM | POA: Diagnosis not present

## 2022-03-08 DIAGNOSIS — I4821 Permanent atrial fibrillation: Secondary | ICD-10-CM | POA: Diagnosis not present

## 2022-03-08 DIAGNOSIS — Z85828 Personal history of other malignant neoplasm of skin: Secondary | ICD-10-CM

## 2022-03-08 DIAGNOSIS — M5459 Other low back pain: Secondary | ICD-10-CM | POA: Diagnosis not present

## 2022-03-08 DIAGNOSIS — Z7901 Long term (current) use of anticoagulants: Secondary | ICD-10-CM | POA: Diagnosis not present

## 2022-03-08 DIAGNOSIS — J309 Allergic rhinitis, unspecified: Secondary | ICD-10-CM | POA: Diagnosis not present

## 2022-03-08 DIAGNOSIS — R531 Weakness: Secondary | ICD-10-CM | POA: Diagnosis not present

## 2022-03-08 DIAGNOSIS — I621 Nontraumatic extradural hemorrhage: Secondary | ICD-10-CM | POA: Diagnosis not present

## 2022-03-08 DIAGNOSIS — R000 Tachycardia, unspecified: Secondary | ICD-10-CM | POA: Diagnosis not present

## 2022-03-08 DIAGNOSIS — M4326 Fusion of spine, lumbar region: Secondary | ICD-10-CM | POA: Diagnosis not present

## 2022-03-08 DIAGNOSIS — Z79899 Other long term (current) drug therapy: Secondary | ICD-10-CM

## 2022-03-08 DIAGNOSIS — M6281 Muscle weakness (generalized): Secondary | ICD-10-CM | POA: Diagnosis not present

## 2022-03-08 DIAGNOSIS — K219 Gastro-esophageal reflux disease without esophagitis: Secondary | ICD-10-CM | POA: Diagnosis present

## 2022-03-08 DIAGNOSIS — G5792 Unspecified mononeuropathy of left lower limb: Secondary | ICD-10-CM | POA: Diagnosis present

## 2022-03-08 DIAGNOSIS — Z881 Allergy status to other antibiotic agents status: Secondary | ICD-10-CM | POA: Diagnosis not present

## 2022-03-08 DIAGNOSIS — I482 Chronic atrial fibrillation, unspecified: Secondary | ICD-10-CM | POA: Diagnosis present

## 2022-03-08 DIAGNOSIS — I10 Essential (primary) hypertension: Secondary | ICD-10-CM | POA: Diagnosis not present

## 2022-03-08 DIAGNOSIS — M1990 Unspecified osteoarthritis, unspecified site: Secondary | ICD-10-CM | POA: Diagnosis present

## 2022-03-08 DIAGNOSIS — Z8546 Personal history of malignant neoplasm of prostate: Secondary | ICD-10-CM

## 2022-03-08 DIAGNOSIS — I480 Paroxysmal atrial fibrillation: Secondary | ICD-10-CM | POA: Diagnosis not present

## 2022-03-08 DIAGNOSIS — S34102A Unspecified injury to L2 level of lumbar spinal cord, initial encounter: Secondary | ICD-10-CM | POA: Diagnosis not present

## 2022-03-08 DIAGNOSIS — M4800 Spinal stenosis, site unspecified: Secondary | ICD-10-CM | POA: Diagnosis not present

## 2022-03-08 DIAGNOSIS — N182 Chronic kidney disease, stage 2 (mild): Secondary | ICD-10-CM | POA: Diagnosis not present

## 2022-03-08 DIAGNOSIS — G9519 Other vascular myelopathies: Secondary | ICD-10-CM | POA: Diagnosis not present

## 2022-03-08 DIAGNOSIS — D696 Thrombocytopenia, unspecified: Secondary | ICD-10-CM | POA: Diagnosis not present

## 2022-03-08 HISTORY — PX: LUMBAR LAMINECTOMY/DECOMPRESSION MICRODISCECTOMY: SHX5026

## 2022-03-08 LAB — CBC
HCT: 43.5 % (ref 39.0–52.0)
Hemoglobin: 15 g/dL (ref 13.0–17.0)
MCH: 35.4 pg — ABNORMAL HIGH (ref 26.0–34.0)
MCHC: 34.5 g/dL (ref 30.0–36.0)
MCV: 102.6 fL — ABNORMAL HIGH (ref 80.0–100.0)
Platelets: 210 10*3/uL (ref 150–400)
RBC: 4.24 MIL/uL (ref 4.22–5.81)
RDW: 13.6 % (ref 11.5–15.5)
WBC: 7.6 10*3/uL (ref 4.0–10.5)
nRBC: 0 % (ref 0.0–0.2)

## 2022-03-08 LAB — BASIC METABOLIC PANEL
Anion gap: 8 (ref 5–15)
BUN: 12 mg/dL (ref 8–23)
CO2: 26 mmol/L (ref 22–32)
Calcium: 9.2 mg/dL (ref 8.9–10.3)
Chloride: 104 mmol/L (ref 98–111)
Creatinine, Ser: 1.16 mg/dL (ref 0.61–1.24)
GFR, Estimated: >60 mL/min (ref >60)
Glucose, Bld: 113 mg/dL — ABNORMAL HIGH (ref 70–99)
Potassium: 4.3 mmol/L (ref 3.5–5.1)
Sodium: 138 mmol/L (ref 135–145)

## 2022-03-08 LAB — SURGICAL PCR SCREEN
MRSA, PCR: NEGATIVE
Staphylococcus aureus: POSITIVE — AB

## 2022-03-08 SURGERY — LUMBAR LAMINECTOMY/DECOMPRESSION MICRODISCECTOMY 2 LEVELS
Anesthesia: General | Site: Spine Lumbar

## 2022-03-08 MED ORDER — DIGOXIN 0.25 MG/ML IJ SOLN
0.2500 mg | Freq: Every day | INTRAMUSCULAR | Status: DC
  Filled 2022-03-08: qty 1

## 2022-03-08 MED ORDER — DEXAMETHASONE SODIUM PHOSPHATE 10 MG/ML IJ SOLN
INTRAMUSCULAR | Status: AC
Start: 2022-03-08 — End: ?
  Filled 2022-03-08: qty 1

## 2022-03-08 MED ORDER — ALBUMIN HUMAN 5 % IV SOLN: INTRAVENOUS | Status: DC | PRN

## 2022-03-08 MED ORDER — OXYCODONE HCL 5 MG PO TABS
5.0000 mg | ORAL_TABLET | ORAL | Status: DC | PRN
  Administered 2022-03-08 – 2022-03-11 (×2): 5 mg via ORAL
  Filled 2022-03-08 (×2): qty 1

## 2022-03-08 MED ORDER — SODIUM CHLORIDE 0.9% FLUSH
3.0000 mL | Freq: Two times a day (BID) | INTRAVENOUS | Status: DC
  Administered 2022-03-08 – 2022-03-10 (×3): 3 mL via INTRAVENOUS

## 2022-03-08 MED ORDER — AMIODARONE HCL IN DEXTROSE 360-4.14 MG/200ML-% IV SOLN
30.0000 mg/h | INTRAVENOUS | Status: DC
  Administered 2022-03-09: 30 mg/h via INTRAVENOUS
  Filled 2022-03-08 (×2): qty 200

## 2022-03-08 MED ORDER — ACETAMINOPHEN 500 MG PO TABS
1000.0000 mg | ORAL_TABLET | Freq: Once | ORAL | Status: AC
Start: 2022-03-08 — End: 2022-03-08
  Administered 2022-03-08: 1000 mg via ORAL
  Filled 2022-03-08: qty 2

## 2022-03-08 MED ORDER — ROCURONIUM BROMIDE 10 MG/ML (PF) SYRINGE
PREFILLED_SYRINGE | INTRAVENOUS | Status: AC
  Filled 2022-03-08: qty 10

## 2022-03-08 MED ORDER — DEXAMETHASONE SODIUM PHOSPHATE 10 MG/ML IJ SOLN
INTRAMUSCULAR | Status: DC | PRN
  Administered 2022-03-08: 5 mg via INTRAVENOUS

## 2022-03-08 MED ORDER — SUGAMMADEX SODIUM 200 MG/2ML IV SOLN
INTRAVENOUS | Status: DC | PRN
  Administered 2022-03-08: 200 mg via INTRAVENOUS

## 2022-03-08 MED ORDER — FENTANYL CITRATE (PF) 250 MCG/5ML IJ SOLN
INTRAMUSCULAR | Status: AC
  Filled 2022-03-08: qty 5

## 2022-03-08 MED ORDER — CHLORHEXIDINE GLUCONATE 0.12 % MT SOLN
15.0000 mL | Freq: Once | OROMUCOSAL | Status: AC
  Administered 2022-03-08: 15 mL via OROMUCOSAL
  Filled 2022-03-08: qty 15

## 2022-03-08 MED ORDER — DILTIAZEM HCL 30 MG PO TABS: 30.0000 mg | ORAL_TABLET | Freq: Two times a day (BID) | ORAL | Status: DC

## 2022-03-08 MED ORDER — ACETAMINOPHEN 650 MG RE SUPP: 650.0000 mg | RECTAL | Status: DC | PRN

## 2022-03-08 MED ORDER — BUPIVACAINE-EPINEPHRINE (PF) 0.25% -1:200000 IJ SOLN
INTRAMUSCULAR | Status: AC
  Filled 2022-03-08: qty 30

## 2022-03-08 MED ORDER — FENTANYL CITRATE (PF) 250 MCG/5ML IJ SOLN
INTRAMUSCULAR | Status: DC | PRN
  Administered 2022-03-08: 25 ug via INTRAVENOUS
  Administered 2022-03-08 (×2): 50 ug via INTRAVENOUS

## 2022-03-08 MED ORDER — MENTHOL 3 MG MT LOZG
1.0000 | LOZENGE | OROMUCOSAL | Status: DC | PRN
  Filled 2022-03-08: qty 9

## 2022-03-08 MED ORDER — METHOCARBAMOL 1000 MG/10ML IJ SOLN
500.0000 mg | Freq: Four times a day (QID) | INTRAVENOUS | Status: DC | PRN
  Filled 2022-03-08: qty 5

## 2022-03-08 MED ORDER — ROCURONIUM BROMIDE 10 MG/ML (PF) SYRINGE
PREFILLED_SYRINGE | INTRAVENOUS | Status: DC | PRN
  Administered 2022-03-08: 70 mg via INTRAVENOUS
  Administered 2022-03-08 (×2): 20 mg via INTRAVENOUS

## 2022-03-08 MED ORDER — ACETAMINOPHEN 325 MG PO TABS
650.0000 mg | ORAL_TABLET | ORAL | Status: DC | PRN
  Administered 2022-03-10 – 2022-03-12 (×7): 650 mg via ORAL
  Filled 2022-03-08 (×8): qty 2

## 2022-03-08 MED ORDER — SODIUM CHLORIDE 0.9 % IV SOLN
2000.0000 mg | Freq: Once | INTRAVENOUS | Status: AC
  Administered 2022-03-08: 2000 mg via TOPICAL
  Filled 2022-03-08: qty 20

## 2022-03-08 MED ORDER — PROPOFOL 10 MG/ML IV BOLUS
INTRAVENOUS | Status: DC | PRN
  Administered 2022-03-08: 70 mg via INTRAVENOUS
  Administered 2022-03-08: 30 mg via INTRAVENOUS

## 2022-03-08 MED ORDER — AMIODARONE LOAD VIA INFUSION
150.0000 mg | Freq: Once | INTRAVENOUS | Status: AC
  Administered 2022-03-08: 150 mg via INTRAVENOUS
  Filled 2022-03-08: qty 83.34

## 2022-03-08 MED ORDER — METHOCARBAMOL 500 MG PO TABS
500.0000 mg | ORAL_TABLET | Freq: Four times a day (QID) | ORAL | Status: DC | PRN
  Administered 2022-03-08: 500 mg via ORAL
  Filled 2022-03-08: qty 1

## 2022-03-08 MED ORDER — CHLORHEXIDINE GLUCONATE CLOTH 2 % EX PADS: 6.0000 | MEDICATED_PAD | Freq: Every day | CUTANEOUS | Status: DC

## 2022-03-08 MED ORDER — LIDOCAINE 2% (20 MG/ML) 5 ML SYRINGE
INTRAMUSCULAR | Status: DC | PRN
  Administered 2022-03-08 (×2): 50 mg via INTRAVENOUS

## 2022-03-08 MED ORDER — LACTATED RINGERS IV SOLN: INTRAVENOUS | Status: DC

## 2022-03-08 MED ORDER — THROMBIN 20000 UNITS EX SOLR
CUTANEOUS | Status: AC
  Filled 2022-03-08: qty 20000

## 2022-03-08 MED ORDER — SURGIFLO WITH THROMBIN (HEMOSTATIC MATRIX KIT) OPTIME
TOPICAL | Status: DC | PRN
  Administered 2022-03-08 (×2): 1 via TOPICAL

## 2022-03-08 MED ORDER — METOPROLOL TARTRATE 5 MG/5ML IV SOLN
2.5000 mg | Freq: Once | INTRAVENOUS | Status: AC
  Administered 2022-03-08: 2.5 mg via INTRAVENOUS

## 2022-03-08 MED ORDER — METOPROLOL TARTRATE 50 MG PO TABS
50.0000 mg | ORAL_TABLET | Freq: Two times a day (BID) | ORAL | Status: DC
  Administered 2022-03-08 – 2022-03-12 (×6): 50 mg via ORAL
  Filled 2022-03-08 (×7): qty 1

## 2022-03-08 MED ORDER — ESMOLOL HCL 100 MG/10ML IV SOLN
INTRAVENOUS | Status: AC
Start: 2022-03-08 — End: ?
  Filled 2022-03-08: qty 10

## 2022-03-08 MED ORDER — OXYCODONE HCL 5 MG PO TABS: 10.0000 mg | ORAL_TABLET | ORAL | Status: DC | PRN

## 2022-03-08 MED ORDER — HYDROMORPHONE HCL 1 MG/ML IJ SOLN
0.5000 mg | INTRAMUSCULAR | Status: DC | PRN
  Administered 2022-03-08 – 2022-03-09 (×2): 0.5 mg via INTRAVENOUS
  Filled 2022-03-08 (×2): qty 0.5

## 2022-03-08 MED ORDER — METOPROLOL TARTRATE 50 MG PO TABS: 50.0000 mg | ORAL_TABLET | Freq: Two times a day (BID) | ORAL | Status: DC

## 2022-03-08 MED ORDER — METOPROLOL TARTRATE 5 MG/5ML IV SOLN
INTRAVENOUS | Status: DC | PRN
  Administered 2022-03-08: 3 mg via INTRAVENOUS
  Administered 2022-03-08: 2 mg via INTRAVENOUS

## 2022-03-08 MED ORDER — METOPROLOL TARTRATE 5 MG/5ML IV SOLN
INTRAVENOUS | Status: AC
  Filled 2022-03-08: qty 5

## 2022-03-08 MED ORDER — LIDOCAINE 2% (20 MG/ML) 5 ML SYRINGE
INTRAMUSCULAR | Status: AC
  Filled 2022-03-08: qty 5

## 2022-03-08 MED ORDER — THROMBIN 20000 UNITS EX SOLR: CUTANEOUS | Status: DC | PRN

## 2022-03-08 MED ORDER — DIGOXIN 0.25 MG/ML IJ SOLN
0.2500 mg | Freq: Every day | INTRAMUSCULAR | Status: AC
  Administered 2022-03-08: 0.25 mg via INTRAVENOUS
  Filled 2022-03-08: qty 1

## 2022-03-08 MED ORDER — ONDANSETRON HCL 4 MG/2ML IJ SOLN: 4.0000 mg | Freq: Four times a day (QID) | INTRAMUSCULAR | Status: DC | PRN

## 2022-03-08 MED ORDER — PHENYLEPHRINE 80 MCG/ML (10ML) SYRINGE FOR IV PUSH (FOR BLOOD PRESSURE SUPPORT)
PREFILLED_SYRINGE | INTRAVENOUS | Status: AC
  Filled 2022-03-08: qty 10

## 2022-03-08 MED ORDER — BUPIVACAINE-EPINEPHRINE (PF) 0.25% -1:200000 IJ SOLN
INTRAMUSCULAR | Status: DC | PRN
  Administered 2022-03-08: 10 mL via PERINEURAL

## 2022-03-08 MED ORDER — DILTIAZEM HCL 30 MG PO TABS
60.0000 mg | ORAL_TABLET | Freq: Three times a day (TID) | ORAL | Status: DC
  Administered 2022-03-08: 60 mg via ORAL
  Filled 2022-03-08: qty 2

## 2022-03-08 MED ORDER — SODIUM CHLORIDE 0.9 % IV SOLN
INTRAVENOUS | Status: DC
Start: 2022-03-08 — End: 2022-03-10

## 2022-03-08 MED ORDER — SODIUM CHLORIDE 0.9% FLUSH: 3.0000 mL | INTRAVENOUS | Status: DC | PRN

## 2022-03-08 MED ORDER — AMIODARONE HCL IN DEXTROSE 360-4.14 MG/200ML-% IV SOLN
60.0000 mg/h | INTRAVENOUS | Status: DC
  Administered 2022-03-08 – 2022-03-09 (×2): 60 mg/h via INTRAVENOUS
  Filled 2022-03-08: qty 200

## 2022-03-08 MED ORDER — FENTANYL CITRATE (PF) 100 MCG/2ML IJ SOLN
INTRAMUSCULAR | Status: AC
  Filled 2022-03-08: qty 2

## 2022-03-08 MED ORDER — ESMOLOL HCL 100 MG/10ML IV SOLN
INTRAVENOUS | Status: DC | PRN
  Administered 2022-03-08: 20 mg via INTRAVENOUS

## 2022-03-08 MED ORDER — ONDANSETRON HCL 4 MG/2ML IJ SOLN
INTRAMUSCULAR | Status: DC | PRN
  Administered 2022-03-08: 4 mg via INTRAVENOUS

## 2022-03-08 MED ORDER — FLEET ENEMA 7-19 GM/118ML RE ENEM: 1.0000 | ENEMA | Freq: Once | RECTAL | Status: DC | PRN

## 2022-03-08 MED ORDER — PHENYLEPHRINE HCL-NACL 20-0.9 MG/250ML-% IV SOLN
INTRAVENOUS | Status: DC | PRN
  Administered 2022-03-08: 20 ug/min via INTRAVENOUS

## 2022-03-08 MED ORDER — MUPIROCIN 2 % EX OINT
1.0000 | TOPICAL_OINTMENT | Freq: Two times a day (BID) | CUTANEOUS | Status: DC
  Administered 2022-03-09 – 2022-03-11 (×6): 1 via NASAL
  Filled 2022-03-08: qty 22

## 2022-03-08 MED ORDER — PHENOL 1.4 % MT LIQD: 1.0000 | OROMUCOSAL | Status: DC | PRN

## 2022-03-08 MED ORDER — FENTANYL CITRATE (PF) 100 MCG/2ML IJ SOLN
25.0000 ug | INTRAMUSCULAR | Status: DC | PRN
  Administered 2022-03-08 (×2): 25 ug via INTRAVENOUS
  Administered 2022-03-08: 50 ug via INTRAVENOUS

## 2022-03-08 MED ORDER — PHENYLEPHRINE 80 MCG/ML (10ML) SYRINGE FOR IV PUSH (FOR BLOOD PRESSURE SUPPORT)
PREFILLED_SYRINGE | INTRAVENOUS | Status: DC | PRN
  Administered 2022-03-08: 160 ug via INTRAVENOUS
  Administered 2022-03-08: 80 ug via INTRAVENOUS

## 2022-03-08 MED ORDER — 0.9 % SODIUM CHLORIDE (POUR BTL) OPTIME
TOPICAL | Status: DC | PRN
  Administered 2022-03-08 (×2): 1000 mL

## 2022-03-08 MED ORDER — CEFAZOLIN SODIUM-DEXTROSE 2-4 GM/100ML-% IV SOLN
2.0000 g | INTRAVENOUS | Status: AC
  Administered 2022-03-08: 2 g via INTRAVENOUS
  Filled 2022-03-08: qty 100

## 2022-03-08 MED ORDER — ORAL CARE MOUTH RINSE: 15.0000 mL | Freq: Once | OROMUCOSAL | Status: AC

## 2022-03-08 MED ORDER — CEFAZOLIN SODIUM-DEXTROSE 1-4 GM/50ML-% IV SOLN
1.0000 g | Freq: Three times a day (TID) | INTRAVENOUS | Status: AC
  Administered 2022-03-08 – 2022-03-09 (×2): 1 g via INTRAVENOUS
  Filled 2022-03-08 (×3): qty 50

## 2022-03-08 MED ORDER — ONDANSETRON HCL 4 MG PO TABS: 4.0000 mg | ORAL_TABLET | Freq: Four times a day (QID) | ORAL | Status: DC | PRN

## 2022-03-08 MED ORDER — POLYETHYLENE GLYCOL 3350 17 G PO PACK
17.0000 g | PACK | Freq: Every day | ORAL | Status: DC | PRN
  Administered 2022-03-10: 17 g via ORAL
  Filled 2022-03-08: qty 1

## 2022-03-08 SURGICAL SUPPLY — 63 items
BAG COUNTER SPONGE SURGICOUNT (BAG) ×2 IMPLANT
BAND RUBBER #18 3X1/16 STRL (MISCELLANEOUS) IMPLANT
BUR EGG ELITE 4.0 (BURR) IMPLANT
BUR MATCHSTICK NEURO 3.0 LAGG (BURR) IMPLANT
CLSR STERI-STRIP ANTIMIC 1/2X4 (GAUZE/BANDAGES/DRESSINGS) ×2 IMPLANT
CORD BIPOLAR FORCEPS 12FT (ELECTRODE) ×2 IMPLANT
DRAIN CHANNEL 15F RND FF W/TCR (WOUND CARE) ×1 IMPLANT
DRAPE MICROSCOPE LEICA 46X105 (MISCELLANEOUS) IMPLANT
DRAPE POUCH INSTRU U-SHP 10X18 (DRAPES) ×2 IMPLANT
DRAPE SURG 17X11 SM STRL (DRAPES) ×2 IMPLANT
DRAPE U-SHAPE 47X51 STRL (DRAPES) ×2 IMPLANT
DRSG OPSITE POSTOP 4X6 (GAUZE/BANDAGES/DRESSINGS) IMPLANT
DRSG OPSITE POSTOP 4X8 (GAUZE/BANDAGES/DRESSINGS) ×1 IMPLANT
DURAPREP 26ML APPLICATOR (WOUND CARE) ×2 IMPLANT
ELECT BLADE 4.0 EZ CLEAN MEGAD (MISCELLANEOUS)
ELECT CAUTERY BLADE 6.4 (BLADE) ×2 IMPLANT
ELECT PENCIL ROCKER SW 15FT (MISCELLANEOUS) ×2 IMPLANT
ELECT REM PT RETURN 9FT ADLT (ELECTROSURGICAL) ×2
ELECTRODE BLDE 4.0 EZ CLN MEGD (MISCELLANEOUS) IMPLANT
ELECTRODE REM PT RTRN 9FT ADLT (ELECTROSURGICAL) ×1 IMPLANT
EVACUATOR SILICONE 100CC (DRAIN) ×1 IMPLANT
GAUZE SPONGE 4X4 12PLY STRL (GAUZE/BANDAGES/DRESSINGS) ×1 IMPLANT
GLOVE BIO SURGEON STRL SZ 6.5 (GLOVE) ×2 IMPLANT
GLOVE BIOGEL PI IND STRL 6.5 (GLOVE) ×1 IMPLANT
GLOVE BIOGEL PI IND STRL 8.5 (GLOVE) ×1 IMPLANT
GLOVE BIOGEL PI INDICATOR 6.5 (GLOVE) ×1
GLOVE BIOGEL PI INDICATOR 8.5 (GLOVE) ×1
GLOVE SS BIOGEL STRL SZ 8.5 (GLOVE) ×1 IMPLANT
GLOVE SUPERSENSE BIOGEL SZ 8.5 (GLOVE) ×1
GOWN STRL REUS W/ TWL LRG LVL3 (GOWN DISPOSABLE) ×1 IMPLANT
GOWN STRL REUS W/TWL 2XL LVL3 (GOWN DISPOSABLE) ×4 IMPLANT
GOWN STRL REUS W/TWL LRG LVL3 (GOWN DISPOSABLE) ×2
KIT BASIN OR (CUSTOM PROCEDURE TRAY) ×2 IMPLANT
NDL SPNL 18GX3.5 QUINCKE PK (NEEDLE) ×2 IMPLANT
NEEDLE 22X1 1/2 (OR ONLY) (NEEDLE) ×2 IMPLANT
NEEDLE SPNL 18GX3.5 QUINCKE PK (NEEDLE) ×4 IMPLANT
NS IRRIG 1000ML POUR BTL (IV SOLUTION) ×3 IMPLANT
PACK LAMINECTOMY ORTHO (CUSTOM PROCEDURE TRAY) ×2 IMPLANT
PACK UNIVERSAL I (CUSTOM PROCEDURE TRAY) ×2 IMPLANT
PATTIES SURGICAL .5 X.5 (GAUZE/BANDAGES/DRESSINGS) ×1 IMPLANT
PATTIES SURGICAL .5 X1 (DISPOSABLE) ×2 IMPLANT
SPONGE SURGIFOAM ABS GEL 100 (HEMOSTASIS) ×1 IMPLANT
SPONGE T-LAP 4X18 ~~LOC~~+RFID (SPONGE) ×3 IMPLANT
STAPLER VISISTAT 35W (STAPLE) IMPLANT
SURGIFLO W/THROMBIN 8M KIT (HEMOSTASIS) ×1 IMPLANT
SUT BONE WAX W31G (SUTURE) ×2 IMPLANT
SUT ETHILON 2 0 PSLX (SUTURE) ×1 IMPLANT
SUT MNCRL AB 3-0 PS2 27 (SUTURE) ×2 IMPLANT
SUT MNCRL+ AB 3-0 CT1 36 (SUTURE) IMPLANT
SUT MONOCRYL AB 3-0 CT1 36IN (SUTURE) ×2
SUT VIC AB 1 CT1 18XCR BRD 8 (SUTURE) ×1 IMPLANT
SUT VIC AB 1 CT1 27 (SUTURE)
SUT VIC AB 1 CT1 27XBRD ANTBC (SUTURE) IMPLANT
SUT VIC AB 1 CT1 8-18 (SUTURE) ×4
SUT VIC AB 2-0 CT1 18 (SUTURE) ×2 IMPLANT
SUT VICRYL 0 UR6 27IN ABS (SUTURE) ×2 IMPLANT
SYR BULB IRRIG 60ML STRL (SYRINGE) ×2 IMPLANT
SYR CONTROL 10ML LL (SYRINGE) ×2 IMPLANT
TOWEL GREEN STERILE (TOWEL DISPOSABLE) ×2 IMPLANT
TOWEL GREEN STERILE FF (TOWEL DISPOSABLE) ×2 IMPLANT
TRAY FOLEY MTR SLVR 16FR STAT (SET/KITS/TRAYS/PACK) ×1 IMPLANT
WATER STERILE IRR 1000ML POUR (IV SOLUTION) ×2 IMPLANT
YANKAUER SUCT BULB TIP NO VENT (SUCTIONS) IMPLANT

## 2022-03-08 NOTE — H&P (Signed)
History:Joseph [name] is a very pleasant 86 year old gentleman who underwent an L4-5 decompression and in situ fusion in September 2022.  Once he recovered from this he was doing well until approximately 5 weeks ago when he started having severe left thigh pain.  Despite medications and conservative management his pain persisted.  While he did have neuropathic pain there was no focal neurological deficits and he had no signs or symptoms of cauda equina syndrome.  Patient ultimately had a repeat MRI which showed a large epidural hematoma T12-L2.  As result we elected to move forward with a decompression T12-L2.  Past Medical History:  Diagnosis Date   Arthritis    Atrial fibrillation ([place])    resolved with cardioversion   Complication of anesthesia    Dysrhythmia    Atrial Fibrilation   GERD (gastroesophageal reflux disease)    Headache    occasional   History of skin cancer    Pneumonia    PONV (postoperative nausea and vomiting)    Prostate cancer ([place])    Sigmoid diverticulitis April of 2012   with small perforation   Squamous cell carcinoma     Allergies  Allergen Reactions   Poison Ivy Extract Rash   Tetracycline Hcl Rash    No current facility-administered medications on file prior to encounter.   Current Outpatient Medications on File Prior to Encounter  Medication Sig Dispense Refill   acetaminophen (TYLENOL) 650 MG CR tablet Take 1,300 mg by mouth 2 (two) times daily as needed for pain.     diclofenac Sodium (VOLTAREN) 1 % GEL Apply 1 Application topically 4 (four) times daily as needed (pain).     lidocaine (LIDODERM) 5 % Place 1 patch onto the skin daily. Remove & Discard patch within 12 hours or as directed by MD 30 patch 0   Menthol-Methyl Salicylate (SALONPAS PAIN RELIEF PATCH EX) Apply 2-5 patches topically daily as needed (pain).     metoprolol tartrate (LOPRESSOR) 50 MG tablet Take 1 tablet (50 mg total) by mouth 2 (two) times daily. 180 tablet 1   OVER THE COUNTER  MEDICATION Apply 1 Application topically daily as needed (pain). veterinary liniment gel     oxyCODONE (ROXICODONE) 5 MG immediate release tablet Take 1 tablet (5 mg total) by mouth every 4 (four) hours as needed for severe pain. 30 tablet 0   TURMERIC CURCUMIN PO Take 1,000 mg by mouth daily.     apixaban (ELIQUIS) 5 MG TABS tablet Take 1 tablet (5 mg total) by mouth 2 (two) times daily. 180 tablet 3    Physical Exam: Vitals:   03/08/22 0857  BP: (!) 148/82  Pulse: 95  Resp: 20  Temp: (!) 97.5 F (36.4 C)  SpO2: 94%   Body mass index is 24.86 kg/m. He is alert and oriented x3 No shortness of breath or chest pain at present Abdomen is soft and nontender with no rebound tenderness.  He denies incontinence of bowel or bladder.  He denies any saddle dysesthesias or testicular numbness/dysesthesias 5/5 motor strength of the lower extremity although motor testing of the left lower extremity is difficult due to severe anterior and lateral thigh pain.  Negative Babinski test, no clonus, symmetrical 1+ deep tendon reflexes in the lower extremity.  2+ dorsalis pedis/posterior tibialis pulses.  Compartments are soft and nontender.  60  Significant back pain with palpation and range of motion pain radiates into the left lower extremity.  He has pain with range of motion of the  hip, knee, ankle on the left side.  Image: MR Lumbar Spine W Wo Contrast  Result Date: 03/02/2022 CLINICAL DATA:  Initial evaluation for low back pain, left lower extremity numbness, weakness. EXAM: MRI LUMBAR SPINE WITHOUT AND WITH CONTRAST TECHNIQUE: Multiplanar and multiecho pulse sequences of the lumbar spine were obtained without and with intravenous contrast. CONTRAST:  74m GADAVIST GADOBUTROL 1 MMOL/ML IV SOLN COMPARISON:  MRI from 03/22/2021. FINDINGS: Segmentation: Standard. Lowest well-formed disc space labeled the L5-S1 level. Alignment: 2 mm retrolisthesis of L3 on L4, with 3 mm anterolisthesis of L5 on S1.  Alignment otherwise normal with preservation of the normal lumbar lordosis. Vertebrae: Vertebral body height maintained without acute or chronic fracture. Bone marrow signal intensity mildly heterogeneous but overall within normal limits. No discrete or worrisome osseous lesions. No evidence for osteomyelitis discitis or septic arthritis within the lumbar spine. Conus medullaris and cauda equina: Conus extends to the T12-L1 level. Conus medullaris within normal limits. There is a heterogeneous lobulated epidural to intradural collection involving the central and left dorsal thecal sac, extending from T12 through L2-3 (series 11, image 8). This measures up to 1.5 x 1.1 x 8.0 cm in maximal dimensions (transverse by AP by craniocaudad). Finding is indeterminate, but could reflect an evolving subacute hematoma. Possible infection with abscess formation could also be considered, although there is no other evidence for infection elsewhere within the lumbar spine. An atypical synovial cyst is also considered, although felt to be unlikely given the overall morphology of this finding. Paraspinal and other soft tissues: Postoperative changes present within the posterior paraspinous soft tissues. Paraspinous soft tissues demonstrate no acute finding. Few scattered T2 hyperintense cyst noted about the visualized kidneys, benign in appearance, no follow-up imaging recommended. Disc levels: T12-L1: Negative interspace. Mild left greater than right facet hypertrophy. Superior component of the intradural to epidural collection at the dorsal thecal sac. No more than mild spinal stenosis. Foramina remain patent. L1-2: Disc desiccation with mild disc bulge. Moderate facet hypertrophy. Intradural to epidural collection centered at the left dorsal aspect of the thecal sac. Resultant severe spinal stenosis, with severe narrowing of the left lateral recess. Foramina remain patent. L2-3: Disc desiccation with mild disc bulge. Moderate  facet and ligament flavum hypertrophy. Inferior aspect of the above described epidural to intradural collection centered at the left posterior thecal sac. Mild canal with moderate left lateral recess stenosis at this level. Foramina remain patent. L3-4: Trace retrolisthesis with degenerative intervertebral disc space narrowing. Diffuse disc bulge with mild reactive endplate spurring. Moderate facet hypertrophy. Mild bilateral subarticular stenosis. Central canal remains patent. Mild bilateral L3 foraminal narrowing. L4-5: Degenerative intervertebral disc space narrowing with diffuse disc bulge and disc desiccation. Associated reactive endplate spurring. Superimposed central and right foraminal disc protrusions. Moderate facet hypertrophy with associated small joint effusions. Prior posterior decompression. No residual spinal stenosis. Mild left with severe right L4 foraminal stenosis. L5-S1: Disc desiccation with mild disc bulge. Severe left greater than right facet arthrosis. No significant spinal stenosis. Foramina remain patent. IMPRESSION: 1. 1.5 x 1.1 x 8.0 cm heterogeneous epidural to intradural collection involving the central and left dorsal thecal sac extending from T12 through L2-3, indeterminate, but could reflect an evolving subacute hematoma. Possible infection with abscess formation could also be considered in the correct clinical setting, although there is no other evidence for infection elsewhere within the lumbar spine. Correlation with symptomatology and laboratory values recommended. Resultant up to severe canal with left lateral recess stenosis at the level of L1-2.  Finding is likely the source of the patient's left lower extremity symptoms. 2. Underlying multilevel degenerative spondylosis as above. No other high-grade spinal stenosis. Severe right L4 foraminal narrowing. Electronically Signed   By: Jeannine Boga M.D.   On: 03/02/2022 23:31    A/P: Joseph [name] is a very pleasant 86 year old  gentleman who underwent an L4-5 decompression and in situ fusion in September 2022.  Once he recovered from this he was doing well until approximately 5 weeks ago when he started having severe left thigh pain.  Despite medications and conservative management his pain persisted.  While he did have neuropathic pain there was no focal neurological deficits and he had no signs or symptoms of cauda equina syndrome.  Patient ultimately had a repeat MRI which showed a large epidural hematoma T12-L2.  The initial read by the radiologist indicated that it was an intradural collection however I spoke with the radiologist and he confirmed that it was truly all extradural.  As result of the spinal stenosis created by the hematoma from the spontaneous bleed we elected to move forward with surgical decompression and evacuation of the hematoma.  All appropriate risks, benefits, and alternatives to surgery were discussed with the patient and he has agreed to surgery.  These risks include: Infection, bleeding, nerve damage, death, stroke, paralysis, ongoing or recurrent worsening pain, need for additional surgery, recurrent bleed and need for fusion surgery with instrumentation.

## 2022-03-08 NOTE — Anesthesia Procedure Notes (Signed)
Procedure Name: Intubation Date/Time: 03/08/2022 11:38 AM  Performed by: Maude Leriche, CRNAPre-anesthesia Checklist: Patient identified, Emergency Drugs available, Suction available and Patient being monitored Patient Re-evaluated:Patient Re-evaluated prior to induction Oxygen Delivery Method: Circle system utilized Preoxygenation: Pre-oxygenation with 100% oxygen Induction Type: IV induction Ventilation: Mask ventilation without difficulty Laryngoscope Size: Mac and 4 Grade View: Grade I Tube type: Oral Tube size: 7.5 mm Number of attempts: 1 Airway Equipment and Method: Stylet and Bite block Placement Confirmation: ETT inserted through vocal cords under direct vision, positive ETCO2 and breath sounds checked- equal and bilateral Secured at: 22 cm Tube secured with: Tape Dental Injury: Teeth and Oropharynx as per pre-operative assessment  Comments: Intubation performed by [facility]

## 2022-03-08 NOTE — Op Note (Signed)
OPERATIVE REPORT  DATE OF SURGERY: 03/08/2022  PATIENT NAME:  Joseph [name] MRN: [id number] DOB: 09-Sep-1935  PCP: [name], MD  PRE-OPERATIVE DIAGNOSIS: Epidural hematoma with severe spinal stenosis T12-L2  POST-OPERATIVE DIAGNOSIS: Same  PROCEDURE:   T12-L2 decompression and removal of epidural hemorrhagic cyst  SURGEON:  Melina Schools, MD  PHYSICIAN ASSISTANT: None  ANESTHESIA:   General  EBL: 643 ml   Complications: Patient had 2 nonsustained runs of ventricular tachycardia but has remained stable per anesthesia.  BRIEF HISTORY: Joseph [name] is a 86 y.o. male who presents with a 5-week history of progressive debilitating neuropathic left thigh pain and back pain.  MRI demonstrated a loculated hemorrhagic epidural cyst causing severe stenosis.  After discussing treatment options we elected to move forward with a lumbar decompression and removal of the hemorrhagic cyst.  All appropriate risks benefits and alternatives were discussed with the patient and consent was obtained.  PROCEDURE DETAILS: Patient was brought into the operating room and was properly positioned on the operating room table.  After induction with general anesthesia the patient was endotracheally intubated.  A timeout was taken to confirm all important data: including patient, procedure, and the level. Teds, SCD's were applied.   A Foley was placed and the patient was turned prone onto the Wilson frame.  All bony prominences were well-padded and the back was prepped and draped in a standard fashion.  2 needles were placed in the back and an x-ray was taken for localization of the incision I marked out my incision to proceed from the superior aspect of the T12 spinous process and the inferior aspect of the L3 spinous process midline incision was made and sharp dissection was carried out to the deep fascia the deep fascia was sharply incised and I stripped the spinous muscles to expose the T12, L1, and L2 spinous  processes.  I then exposed the lamina and the facet complexes.  A Penfield 4 was placed underneath the L2 lamina and a second x-ray was taken confirming I was at the L2 level.  Self-retaining retractors were placed.  Using a double-action Leksell rongeur I remove the spinous process of T12 and L1.  I then developed a plane underneath the L2 lamina and performed a meniscectomy of L2 using a 2 and 3 mm Kerrison rongeur.  Once I had enough of a central decompression I could dissect through the central raphae of the ligamentum flavum and remove this.  Centrally and expanding to the left side consistent with what was seen on MRI was a hemorrhagic cyst.  I elected to continue my decompression superiorly before decompressing the cyst.  I continued my laminectomy of L2 superiorly and then identified the leading edge of the L1 lamina.  Using Kerrison rongeurs I performed a laminotomy of this level.  I was then able to place a neuro patty underneath the remaining portion of the L2 lamina and complete the central decompression.  A second firm hemorrhagic cyst came into view at this point.  Again this is consistent with what was seen on the preoperative MRI.  I then continued superiorly and trim down the posterior portion of the T12 lamina and then resected the remainder of the L1 lamina to complete the laminectomy at this level.  At this point time I now had decompressed from the midportion of T12 to the superior portion of L3.  This spanned the area of maximum compression seen on the preoperative MRI.  Because of the increased neuropathic left  leg pain I did a more generous medial facetectomy on the left side.  Once I had this decompressed I then used a 15 blade scalpel to dissect into the cyst.  Once the cyst wall was removed I was able to use my nerve hook to remove the hematoma that had formed inside the cyst.  I then began gently dissecting and removing the cyst wall.  This allowed me to further decompress the thecal  sac itself.  I had removed the large dorsal hemorrhagic cyst and removed the hematoma that was within them.  Using a Penfield 4 and nerve hook I continued to create a plane between the posterior aspect of the cyst wall and the dorsal surface of the thecal sac.  Should be noted that it was very apparent but I was able to create a plane that allowed me to go into the left lateral recess and continue the decompression.  I was able to visualize the L3, L2, and L1 nerve roots and perform a generous medial facetectomy and foraminotomy at each of these levels to decompress them.  In addition I could freely pass my [facility] under the remaining portion of the T12 lamina confirming had an adequate decompression at this level.  I copiously irrigated the wound with normal saline and I continue to attempt to gently dissect the posterior wall of the cyst away from the thecal sac.  I noted it was very adherent and I became quite concerned about causing a durotomy.  The thecal sac itself was now noted to be expanding into the decompression site.  I had an adequate central decompression from pedicle to pedicle and from T12 down to L3.  Third x-ray confirmed that I decompressed from the area of T12 down to the inferior aspect of the L2-3 disc space which was the area of maximum stenosis.  I then performed a Valsalva and there was no CSF leak.  After copious irrigation I then used bipolar electrocautery, Floseal, and topical TXA to obtain hemostasis.  The wound was then copes irrigated again and a drain was placed and brought out through a separate stab incision.  At this point I had obtained hemostasis but I wanted to keep the drain in case there was bleeding.  The retractors were removed and I closed the deep fascia with interrupted #1 Vicryl sutures.  I then did a layer 2-0 Vicryl suture, and finally a 3-0 Monocryl for the skin.  The drain was secured with a 2-0 nylon stitch.  Steri-Strips and a dry dressing were applied  and the patient was extubated and transferred the PACU without incident.  The end of the case all needle and sponge counts were correct.  There were no adverse intraoperative events except for 2 runs of ventricular tachycardia.  Per anesthesia the patient remained stable and we recommended a cardiology consult postoperatively.  Melina Schools, MD 03/08/2022 3:06 PM

## 2022-03-08 NOTE — Anesthesia Postprocedure Evaluation (Signed)
Anesthesia Post Note  Patient: ESEQUIEL KLEINFELTER  Procedure(s) Performed: THORACIC TWELVE TO LUMBAR FIVE DECOMPRESSION (Spine Lumbar)     Patient location during evaluation: PACU Anesthesia Type: General Level of consciousness: awake and alert Pain management: pain level controlled Vital Signs Assessment: post-procedure vital signs reviewed and stable Respiratory status: spontaneous breathing, nonlabored ventilation, respiratory function stable and patient connected to nasal cannula oxygen Cardiovascular status: blood pressure returned to baseline and stable Postop Assessment: no apparent nausea or vomiting Anesthetic complications: no   No notable events documented.  Last Vitals:  Vitals:   03/08/22 1620 03/08/22 1635  BP: 108/68 118/64  Pulse: (!) 112 87  Resp: 16 14  Temp:  36.5 C  SpO2: 95% 96%    Last Pain:  Vitals:   03/08/22 1635  TempSrc:   PainSc: 4                  Joyann Spidle L Olen Eaves

## 2022-03-08 NOTE — Transfer of Care (Signed)
Immediate Anesthesia Transfer of Care Note  Patient: Joseph [name]  Procedure(s) Performed: THORACIC TWELVE TO LUMBAR FIVE DECOMPRESSION (Spine Lumbar)  Patient Location: PACU  Anesthesia Type:General  Level of Consciousness: awake, alert  and oriented  Airway & Oxygen Therapy: Patient Spontanous Breathing and Patient connected to nasal cannula oxygen  Post-op Assessment: Report given to RN and Post -op Vital signs reviewed and stable  Post vital signs: Reviewed and stable  Last Vitals:  Vitals Value Taken Time  BP 116/68 03/08/22 1521  Temp    Pulse 115 03/08/22 1526  Resp 19 03/08/22 1526  SpO2 95 % 03/08/22 1526  Vitals shown include unvalidated device data.  Last Pain:  Vitals:   03/08/22 0933  TempSrc:   PainSc: 4          Complications: No notable events documented.

## 2022-03-08 NOTE — Brief Op Note (Signed)
03/08/2022  3:21 PM  PATIENT:  Joseph [name]  86 y.o. male  PRE-OPERATIVE DIAGNOSIS:  Epidural hematoma with stenosis  POST-OPERATIVE DIAGNOSIS:  Epidural hematoma with stenosis  PROCEDURE:  Procedure(s) with comments: THORACIC TWELVE TO LUMBAR FIVE DECOMPRESSION (N/A) - 2hr 3 C-Bed  SURGEON:  Surgeon(s) and Role:    Melina Schools, MD - Primary  PHYSICIAN ASSISTANT:   ASSISTANTS: none   ANESTHESIA:   general  EBL:  150 mL   BLOOD ADMINISTERED:none  DRAINS:  1 posterior drain     LOCAL MEDICATIONS USED:  MARCAINE     SPECIMEN:  No Specimen  DISPOSITION OF SPECIMEN:  N/A  COUNTS:  YES  TOURNIQUET:  * No tourniquets in log *  DICTATION: .Dragon Dictation  PLAN OF CARE: Admit to inpatient   PATIENT DISPOSITION:  PACU - hemodynamically stable.

## 2022-03-08 NOTE — Consult Note (Signed)
Referring Physician: Melina Schools, MD  Joseph [name] is an 86 y.o. male.                       Chief Complaint: Non-sustained VT and atrial fibrillation  HPI: 86 years old male with PMH of Arthritis, atrial fibrillation, GERD, prostate cancer and s/p squamous cell carcinoma has epidural hemorrhagic cyst removal and T12-L2 decompression today. He had 2 episodes of non-sustained VT. Patient is in recovery area without chest pain or palpitations. He is off Eliquis for surgery. His metoprolol will be resumed on the floor. His work up in last 2 months shows normal TSH, stable CBC, BMET and normal/stable echocardiogram.   Past Medical History:  Diagnosis Date   Arthritis    Atrial fibrillation ([place])    resolved with cardioversion   Complication of anesthesia    Dysrhythmia    Atrial Fibrilation   GERD (gastroesophageal reflux disease)    Headache    occasional   History of skin cancer    Pneumonia    PONV (postoperative nausea and vomiting)    Prostate cancer ([place])    Sigmoid diverticulitis April of 2012   with small perforation   Squamous cell carcinoma       Past Surgical History:  Procedure Laterality Date   CARDIOVERSION  01/07/2009   LASIK     LUMBAR LAMINECTOMY/DECOMPRESSION MICRODISCECTOMY N/A 05/06/2021   Procedure: Lumbar decompression and insitu fusion Lumbar four - Lumbar five;  Surgeon: Melina Schools, MD;  Location: [facility];  Service: Orthopedics;  Laterality: N/A;  3 hrs 3 C-Bed   LYMPHADENECTOMY Bilateral 01/17/2015   Procedure: PELVIC LYMPHADENECTOMY;  Surgeon: Alexis Frock, MD;  Location: [facility];  Service: Urology;  Laterality: Bilateral;   NASAL SEPTUM SURGERY     ROBOT ASSISTED LAPAROSCOPIC RADICAL PROSTATECTOMY N/A 01/17/2015   Procedure: ROBOTIC ASSISTED LAPAROSCOPIC RADICAL PROSTATECTOMY WITH INDOCYANINE GREEN DYE INJECTION;  Surgeon: Alexis Frock, MD;  Location: [facility];  Service: Urology;  Laterality: N/A;   SHOULDER SURGERY     x 2   TONSILLECTOMY     as  a child    Family History  Problem Relation Age of Onset   Other Mother        no neg hx   Other Father        no neg hx   Social History:  reports that he has never smoked. He has never used smokeless tobacco. He reports current alcohol use. He reports that he does not use drugs.  Allergies:  Allergies  Allergen Reactions   Poison Ivy Extract Rash   Tetracycline Hcl Rash    Medications Prior to Admission  Medication Sig Dispense Refill   acetaminophen (TYLENOL) 650 MG CR tablet Take 1,300 mg by mouth 2 (two) times daily as needed for pain.     diclofenac Sodium (VOLTAREN) 1 % GEL Apply 1 Application topically 4 (four) times daily as needed (pain).     lidocaine (LIDODERM) 5 % Place 1 patch onto the skin daily. Remove & Discard patch within 12 hours or as directed by MD 30 patch 0   Menthol-Methyl Salicylate (SALONPAS PAIN RELIEF PATCH EX) Apply 2-5 patches topically daily as needed (pain).     metoprolol tartrate (LOPRESSOR) 50 MG tablet Take 1 tablet (50 mg total) by mouth 2 (two) times daily. 180 tablet 1   OVER THE COUNTER MEDICATION Apply 1 Application topically daily as needed (pain). veterinary liniment gel     oxyCODONE (  ROXICODONE) 5 MG immediate release tablet Take 1 tablet (5 mg total) by mouth every 4 (four) hours as needed for severe pain. 30 tablet 0   TURMERIC CURCUMIN PO Take 1,000 mg by mouth daily.     apixaban (ELIQUIS) 5 MG TABS tablet Take 1 tablet (5 mg total) by mouth 2 (two) times daily. 180 tablet 3    Results for orders placed or performed during the hospital encounter of 03/08/22 (from the past 48 hour(s))  Surgical pcr screen     Status: Abnormal   Collection Time: 03/08/22  8:49 AM   Specimen: Nasal Mucosa; Nasal Swab  Result Value Ref Range   MRSA, PCR NEGATIVE NEGATIVE   Staphylococcus aureus POSITIVE (A) NEGATIVE    Comment: (NOTE) The Xpert SA Assay (FDA approved for NASAL specimens in patients 61 years of age and older), is one component of a  comprehensive surveillance program. It is not intended to diagnose infection nor to guide or monitor treatment. Performed at [facility], [place] [address], [place], [place] [address]   Basic metabolic panel per protocol     Status: Abnormal   Collection Time: 03/08/22  9:45 AM  Result Value Ref Range   Sodium 138 135 - 145 mmol/L   Potassium 4.3 3.5 - 5.1 mmol/L   Chloride 104 98 - 111 mmol/L   CO2 26 22 - 32 mmol/L   Glucose, Bld 113 (H) 70 - 99 mg/dL    Comment: Glucose reference range applies only to samples taken after fasting for at least 8 hours.   BUN 12 8 - 23 mg/dL   Creatinine, Ser 1.16 0.61 - 1.24 mg/dL   Calcium 9.2 8.9 - 10.3 mg/dL   GFR, Estimated >60 >60 mL/min    Comment: (NOTE) Calculated using the CKD-EPI Creatinine Equation (2021)    Anion gap 8 5 - 15    Comment: Performed at [facility] [address] [place], [place] [address]  CBC per protocol     Status: Abnormal   Collection Time: 03/08/22  9:45 AM  Result Value Ref Range   WBC 7.6 4.0 - 10.5 K/uL   RBC 4.24 4.22 - 5.81 MIL/uL   Hemoglobin 15.0 13.0 - 17.0 g/dL   HCT 43.5 39.0 - 52.0 %   MCV 102.6 (H) 80.0 - 100.0 fL   MCH 35.4 (H) 26.0 - 34.0 pg   MCHC 34.5 30.0 - 36.0 g/dL   RDW 13.6 11.5 - 15.5 %   Platelets 210 150 - 400 K/uL   nRBC 0.0 0.0 - 0.2 %    Comment: Performed at [facility], [place] [address], [place],  [address]   DG Lumbar Spine 1 View  Result Date: 03/08/2022 CLINICAL DATA:  Surgical localization. EXAM: LUMBAR SPINE - 1 VIEW COMPARISON:  Radiograph of same day.  MRI of March 02, 2022. FINDINGS: Single intraoperative cross-table lateral projection was obtained of the lumbar spine. These images demonstrate surgical probes at approximately the L1 and L2-3 levels. IMPRESSION: Surgical localization as described above. Electronically Signed   By: Marijo Conception M.D.   On: 03/08/2022 13:35   DG Lumbar Spine 2-3 Views  Result Date: 03/08/2022 CLINICAL DATA:   T12-L2 decompression. EXAM: LUMBAR SPINE - 2-3 VIEW COMPARISON:  MRI 03/02/2022 FINDINGS: Initial film shows needles directed at the pedicle level of T11 and L1. Second film shows tissue spreaders posterior with a probe directed at the pedicle level of L3. Findings discussed by  telephone with Dr. Rolena Infante at 1230 hours. IMPRESSION: Pedicle level L3 localized. Electronically Signed   By: Nelson Chimes M.D.   On: 03/08/2022 12:32    Review Of Systems Constitutional: No fever, chills, weight loss or gain. Eyes: No vision change, wears glasses. No discharge or pain. Ears: Mild hearing loss, No tinnitus. Respiratory: No asthma, COPD, pneumonias. No shortness of breath. No hemoptysis. Cardiovascular: No chest pain, positive palpitation, leg edema. Gastrointestinal: No nausea, vomiting, diarrhea, constipation. No GI bleed. No hepatitis. Genitourinary: No dysuria, hematuria, kidney stone. No incontinance. Neurological: No headache, stroke, seizures.  Psychiatry: No psych facility admission for anxiety, depression, suicide. No detox. Skin: No rash. Musculoskeletal: Positive joint pain, neck pain, back pain. Lymphadenopathy: No lymphadenopathy. Hematology: No anemia or easy bruising.   Blood pressure 118/64, pulse 87, temperature 97.7 F (36.5 C), resp. rate 14, height '5\' 6"'$  (1.676 m), weight 69.9 kg, SpO2 96 %. Body mass index is 24.86 kg/m. General appearance: alert, cooperative, appears stated age and no distress Head: Normocephalic, atraumatic. Eyes: Blue eyes, pink conjunctiva, corneas clear.  Neck: No adenopathy, no carotid bruit, no JVD, supple, symmetrical, trachea midline and thyroid not enlarged. Resp: Clear to auscultation bilaterally. Cardio: Irregular rate and rhythm, S1, S2 normal, II/VI systolic murmur, no click, rub or gallop GI: Soft, non-tender; bowel sounds normal; no organomegaly. Extremities: No edema, cyanosis or clubbing. Skin: Warm and dry.  Neurologic: Alert and oriented X  2, normal strength.   Assessment/Plan Non-sustained VT Atrial fibrillation with moderate ventricular response S/P back surgery  Plan: Resume metoprolol. If needed use low dose digoxin or diltiazem for rate control. Consider amiodarone use if addition heart rate control is needed. Will check magnesium level. Recent normal TSH.  Time spent: Review of old records, Lab, x-rays, EKG, other cardiac tests, examination, discussion with patient/Nurse/Doctor over 70 minutes.  Birdie Riddle, MD  03/08/2022, 4:43 PM

## 2022-03-09 ENCOUNTER — Encounter (HOSPITAL_COMMUNITY): Payer: Self-pay | Admitting: Orthopedic Surgery

## 2022-03-09 MED ORDER — AMIODARONE HCL 200 MG PO TABS
200.0000 mg | ORAL_TABLET | Freq: Every day | ORAL | Status: DC
  Administered 2022-03-09 – 2022-03-12 (×4): 200 mg via ORAL
  Filled 2022-03-09 (×4): qty 1

## 2022-03-09 MED ORDER — DILTIAZEM HCL 30 MG PO TABS
30.0000 mg | ORAL_TABLET | Freq: Two times a day (BID) | ORAL | Status: DC
  Administered 2022-03-09 – 2022-03-12 (×6): 30 mg via ORAL
  Filled 2022-03-09 (×6): qty 1

## 2022-03-09 MED ORDER — METHOCARBAMOL 500 MG PO TABS: 500.0000 mg | ORAL_TABLET | Freq: Three times a day (TID) | ORAL | Status: DC | PRN

## 2022-03-09 NOTE — Progress Notes (Signed)
Orthopedic Tech Progress Note Patient Details:  Joseph [name] 03-19-1936 [id number]  Ortho Devices Type of Ortho Device: Lumbar corsett Ortho Device/Splint Location: BACK Ortho Device/Splint Interventions: Ordered, Application, Adjustment   Post Interventions Patient Tolerated: Well Instructions Provided: [facility] [place] 03/09/2022, 4:13 PM

## 2022-03-09 NOTE — Progress Notes (Signed)
Patient in afib HR up to 160s overnight. Dr. Doylene Canard initially ordered Digoxin IV x1, and to start po Cardizem and give Metoprolol early. HR with some improvement but still sustained afib 130s-150s. Notified MD and orders received to start Amiodarone drip with bolus. Amio drip started, called MD later on to clarify HR parameters as HR began to trend into 70s afib. Stated HR in 70s was okay, but hold 6 am Cardizem po and Metoprolol po 10 am dose. HR continued to trend down into 60s and pt hypotensive, called again. Received orders to initiate lower Amiodarone rate to 16.67/ml at 0225 instead of scheduled 0500 time. Rate lowered, pt HR persistently brady as low as 51, pt continues with hypotension but improved with rate changed. Called MD again and received orders to stop Amiodarone drip completely. RN continues to monitor HR and BP closely and will adjust the plan of care accordingly.

## 2022-03-09 NOTE — Evaluation (Signed)
Physical Therapy Evaluation Patient Details Name: Joseph [name] MRN: [id number] DOB: [birth year] Today's Date: 03/09/2022  History of Present Illness  86 yo. male admitted 03/08/22, due to  Epidural hematoma with severe spinal stenosis T12-L2. Underwent T12-L5 decompression. Complicated by non-sustained VT and afib. PMH of Arthritis, atrial fibrillation, GERD, prostate cancer and s/p squamous cell carcinoma  Clinical Impression  Patient is s/p above surgery resulting in functional limitations due to the deficits listed below (see PT Problem List). HR 76 at rest, up to 90s while ambulating. Educated on back precautions, log roll technique, and safe AD use with RW for transfers and ambulation. No overt LOB or buckling noted. Required reinforcement to follow precautions. Patient will benefit from skilled PT to increase their independence and safety with mobility to allow discharge to the venue listed below.          Recommendations for follow up therapy are one component of a multi-disciplinary discharge planning process, led by the attending physician.  Recommendations may be updated based on patient status, additional functional criteria and insurance authorization.  Follow Up Recommendations Home health PT      Assistance Recommended at Discharge Intermittent Supervision/Assistance  Patient can return home with the following  A little help with walking and/or transfers;A little help with bathing/dressing/bathroom;Assist for transportation    Equipment Recommendations None recommended by PT  Recommendations for Other Services       Functional Status Assessment Patient has had a recent decline in their functional status and demonstrates the ability to make significant improvements in function in a reasonable and predictable amount of time.     Precautions / Restrictions Precautions Precautions: Back Precaution Booklet Issued: Yes (comment) Precaution Comments: reviewed Required Braces or  Orthoses: Spinal Brace Spinal Brace: Lumbar corset;Applied in sitting position Restrictions Weight Bearing Restrictions: No      Mobility  Bed Mobility Overal bed mobility: Needs Assistance Bed Mobility: Rolling, Sidelying to Sit Rolling: Min guard Sidelying to sit: Min guard       General bed mobility comments: Educated on log roll technique, cues required throughout. Close guard for safety, effortful but completed on his own.    Transfers Overall transfer level: Needs assistance Equipment used: Rollator (4 wheels) Transfers: Sit to/from Stand Sit to Stand: Min guard           General transfer comment: Close guard for safety. First stand from bed pt with episode of urinary incontinence. Practiced 2 more times with cues for hand placement and precautions. No physical assist required - some unsteadiness noted, and cued to keep hands on RW upon standing.    Ambulation/Gait Ambulation/Gait assistance: Min guard Gait Distance (Feet): 22 Feet Assistive device: Rolling walker (2 wheels) Gait Pattern/deviations: Step-through pattern, Decreased stride length, Drifts right/left, Shuffle Gait velocity: slow Gait velocity interpretation: <1.31 ft/sec, indicative of household ambulator   General Gait Details: Slower gait, intermittent shuffling. Educated on appropriate AD use with RW. No overt LOB or buckling noted. Short distance into hallway and back into room performed at min guard level, assisted with lines/leads.  Stairs            Wheelchair Mobility    Modified Rankin (Stroke Patients Only)       Balance Overall balance assessment: Needs assistance Sitting-balance support: No upper extremity supported, Feet supported Sitting balance-Leahy Scale: Good     Standing balance support: No upper extremity supported, During functional activity Standing balance-Leahy Scale: Fair  Pertinent Vitals/Pain Pain Assessment Pain  Assessment: Faces Faces Pain Scale: Hurts even more Pain Location: back Pain Descriptors / Indicators: Aching, Burning Pain Intervention(s): Monitored during session, Repositioned    Home Living Family/patient expects to be discharged to:: Private residence (ILF at [facility]) Living Arrangements: Spouse/significant other Available Help at Discharge: Family;Available 24 hours/day Type of Home: House Home Access: Level entry       Home Layout: One level Home Equipment: Conservation officer, nature (2 wheels);Cane - quad;Grab bars - tub/shower;Grab bars - toilet;BSC/3in1;Shower seat - built in Additional Comments: [facility] facility    Prior Function Prior Level of Function : Independent/Modified Independent             Mobility Comments: pt walks regularly in neighborhood and participates in chair volleyball       Hand Dominance   Dominant Hand: Right    Extremity/Trunk Assessment   Upper Extremity Assessment Upper Extremity Assessment: Defer to OT evaluation    Lower Extremity Assessment Lower Extremity Assessment: Generalized weakness;LLE deficits/detail LLE Sensation: decreased light touch    Cervical / Trunk Assessment Cervical / Trunk Assessment: Normal  Communication   Communication: No difficulties  Cognition Arousal/Alertness: Awake/alert Behavior During Therapy: WFL for tasks assessed/performed Overall Cognitive Status: Within Functional Limits for tasks assessed                                          General Comments General comments (skin integrity, edema, etc.): HR 76 at rest, up into low 90s during ambulation    Exercises     Assessment/Plan    PT Assessment Patient needs continued PT services  PT Problem List Decreased strength;Decreased range of motion;Decreased activity tolerance;Decreased balance;Decreased mobility;Decreased knowledge of use of DME;Decreased knowledge of precautions;Cardiopulmonary status  limiting activity       PT Treatment Interventions DME instruction;Gait training;Functional mobility training;Therapeutic exercise;Therapeutic activities;Balance training;Neuromuscular re-education;Patient/family education    PT Goals (Current goals can be found in the Care Plan section)  Acute Rehab PT Goals Patient Stated Goal: Get well, return home PT Goal Formulation: With patient Time For Goal Achievement: 03/16/22 Potential to Achieve Goals: Good    Frequency Min 4X/week     Co-evaluation               AM-PAC PT "6 Clicks" Mobility  Outcome Measure Help needed turning from your back to your side while in a flat bed without using bedrails?: A Little Help needed moving from lying on your back to sitting on the side of a flat bed without using bedrails?: A Little Help needed moving to and from a bed to a chair (including a wheelchair)?: A Little Help needed standing up from a chair using your arms (e.g., wheelchair or bedside chair)?: A Little Help needed to walk in hospital room?: A Little Help needed climbing 3-5 steps with a railing? : A Lot 6 Click Score: 17    End of Session Equipment Utilized During Treatment: Gait belt Activity Tolerance: Patient tolerated treatment well Patient left: in chair;with call bell/phone within reach;with chair alarm set;with family/visitor present;with SCD's reapplied Nurse Communication: Mobility status PT Visit Diagnosis: Unsteadiness on feet (R26.81);Other abnormalities of gait and mobility (R26.89);Muscle weakness (generalized) (M62.81);Difficulty in walking, not elsewhere classified (R26.2);Pain Pain - part of body:  (back)    Time: 0998-3382 PT Time Calculation (min) (ACUTE ONLY): 56 min   Charges:   PT  Evaluation $PT Eval Low Complexity: 1 Low PT Treatments $Gait Training: 8-22 mins $Therapeutic Activity: 23-37 mins        Candie Mile, PT   Ellouise Newer 03/09/2022, 10:20 AM

## 2022-03-09 NOTE — Progress Notes (Signed)
Ambulated in room and hallway with LSO brace in place.  Tolerated activity well.

## 2022-03-09 NOTE — Progress Notes (Signed)
    Subjective: Procedure(s) (LRB): THORACIC TWELVE TO LUMBAR FIVE DECOMPRESSION (N/A) 1 Day Post-Op  Patient reports pain as 3 on 0-10 scale.  Reports decreased leg pain reports incisional back pain   Positive void Negative bowel movement Negative flatus Negative chest pain or shortness of breath  Objective: Vital signs in last 24 hours: Temp:  [97.5 F (36.4 C)-98.1 F (36.7 C)] 98 F (36.7 C) (08/01 0713) Pulse Rate:  [50-114] 66 (08/01 0713) Resp:  [11-26] 18 (08/01 0713) BP: (88-128)/(57-83) 110/70 (08/01 0713) SpO2:  [90 %-97 %] 97 % (08/01 0713)  Intake/Output from previous day: 07/31 0701 - 08/01 0700 In: 3034.2 [I.V.:2684.2; IV Piggyback:350] Out: 887 [Urine:597; Drains:140; Blood:150]  Labs: Recent Labs    03/08/22 0945  WBC 7.6  RBC 4.24  HCT 43.5  PLT 210   Recent Labs    03/08/22 0945  NA 138  K 4.3  CL 104  CO2 26  BUN 12  CREATININE 1.16  GLUCOSE 113*  CALCIUM 9.2   No results for input(s): "LABPT", "INR" in the last 72 hours.  Physical Exam: Neurologically intact ABD soft Intact pulses distally Incision: dressing C/D/I Compartment soft Body mass index is 24.86 kg/m.   Assessment/Plan: Patient stable  Continue mobilization with physical therapy Continue care  Patient stable.  Reports decreased radicular left leg pain No CP or SOB.  Will defer cardiac monitoring to Cardiology team.  Appreciate the input. Hold on narcotics as patient demonstrating some early signs of confusion. Continue PT Will keep drain in today and re-evaluate removal if output remains low  Melina Schools, MD [facility] [phone]

## 2022-03-09 NOTE — Consult Note (Signed)
Ref: [name], MD   Subjective:  Awake. Atrial fibrillation continues but with controlled heart rate. Awaiting surgical clearance to resume anticoagulation.  Objective:  Vital Signs in the last 24 hours: Temp:  [97.5 F (36.4 C)-98.1 F (36.7 C)] 98 F (36.7 C) (08/01 1632) Pulse Rate:  [50-95] 85 (08/01 1632) Cardiac Rhythm: Atrial fibrillation (08/01 0732) Resp:  [12-26] 17 (08/01 1632) BP: (88-134)/(57-84) 134/84 (08/01 1632) SpO2:  [90 %-97 %] 92 % (08/01 1632)  Physical Exam: BP Readings from Last 1 Encounters:  03/09/22 134/84     Wt Readings from Last 1 Encounters:  03/08/22 69.9 kg    Weight change:  Body mass index is 24.86 kg/m. HEENT: [place]/AT, Eyes-Blue, Conjunctiva-Pink, Sclera-Non-icteric Neck: No JVD, No bruit, Trachea midline. Lungs:  Clear, Bilateral. Cardiac:  Regular rhythm, normal S1 and S2, no S3. II/VI systolic murmur. Abdomen:  Soft, non-tender. BS present. Extremities:  No edema present. No cyanosis. No clubbing. CNS: AxOx2, Cranial nerves grossly intact, moves all 4 extremities.  Skin: Warm and dry.   Intake/Output from previous day: 07/31 0701 - 08/01 0700 In: 3034.2 [I.V.:2684.2; IV Piggyback:350] Out: 887 [Urine:597; Drains:140; Blood:150]    Lab Results: BMET    Component Value Date/Time   NA 138 03/08/2022 0945   NA 136 01/02/2022 0444   NA 137 01/01/2022 0530   NA 143 04/08/2021 1026   K 4.3 03/08/2022 0945   K 4.1 01/02/2022 0444   K 4.1 01/01/2022 0530   CL 104 03/08/2022 0945   CL 103 01/02/2022 0444   CL 108 01/01/2022 0530   CO2 26 03/08/2022 0945   CO2 27 01/02/2022 0444   CO2 21 (L) 01/01/2022 0530   GLUCOSE 113 (H) 03/08/2022 0945   GLUCOSE 104 (H) 01/02/2022 0444   GLUCOSE 108 (H) 01/01/2022 0530   BUN 12 03/08/2022 0945   BUN 17 01/02/2022 0444   BUN 20 01/01/2022 0530   BUN 16 04/08/2021 1026   CREATININE 1.16 03/08/2022 0945   CREATININE 0.96 01/02/2022 0444   CREATININE 0.83 01/01/2022 0530   CALCIUM  9.2 03/08/2022 0945   CALCIUM 8.3 (L) 01/02/2022 0444   CALCIUM 8.2 (L) 01/01/2022 0530   GFRNONAA >60 03/08/2022 0945   GFRNONAA >60 01/02/2022 0444   GFRNONAA >60 01/01/2022 0530   GFRAA >60 01/18/2015 0500   GFRAA >60 01/10/2015 1005   GFRAA  11/29/2010 0620    >60        The eGFR has been calculated using the MDRD equation. This calculation has not been validated in all clinical situations. eGFR's persistently <60 mL/min signify possible Chronic Kidney Disease.   CBC    Component Value Date/Time   WBC 7.6 03/08/2022 0945   RBC 4.24 03/08/2022 0945   HGB 15.0 03/08/2022 0945   HGB 14.9 01/06/2022 1139   HCT 43.5 03/08/2022 0945   HCT 42.8 01/06/2022 1139   PLT 210 03/08/2022 0945   PLT 293 01/06/2022 1139   MCV 102.6 (H) 03/08/2022 0945   MCV 100 (H) 01/06/2022 1139   MCH 35.4 (H) 03/08/2022 0945   MCHC 34.5 03/08/2022 0945   RDW 13.6 03/08/2022 0945   RDW 13.1 01/06/2022 1139   LYMPHSABS 0.8 12/31/2021 1200   MONOABS 0.8 12/31/2021 1200   EOSABS 0.0 12/31/2021 1200   BASOSABS 0.0 12/31/2021 1200   HEPATIC Function Panel Recent Labs    12/31/21 1223 01/01/22 0530 01/02/22 0444  PROT 7.6  --   --   ALBUMIN 4.3 2.8* 2.8*  AST 20  --   --   ALT 14  --   --   ALKPHOS 57  --   --   BILIDIR 0.2  --   --   IBILI 0.4  --   --    HEMOGLOBIN A1C No results found for: "MPG" CARDIAC ENZYMES No results found for: "CKTOTAL", "CKMB", "CKMBINDEX", "TROPONINI" BNP No results for input(s): "PROBNP" in the last 8760 hours. TSH Recent Labs    12/31/21 1751  TSH 1.419   CHOLESTEROL No results for input(s): "CHOL" in the last 8760 hours.  Scheduled Meds:  amiodarone  200 mg Oral Daily   diltiazem  30 mg Oral Q12H   metoprolol tartrate  50 mg Oral BID   mupirocin ointment  1 Application Nasal BID   sodium chloride flush  3 mL Intravenous Q12H   Continuous Infusions:  sodium chloride     lactated ringers Stopped (03/09/22 1223)   PRN Meds:.acetaminophen  **OR** acetaminophen, menthol-cetylpyridinium **OR** phenol, methocarbamol **OR** [DISCONTINUED] methocarbamol (ROBAXIN) IV, ondansetron **OR** ondansetron (ZOFRAN) IV, oxyCODONE, polyethylene glycol, sodium chloride flush, sodium phosphate  Assessment/Plan:  Chronic atrial fibrillation, CHA2DS2VASc score of 3 Non-sustained VT S/P back surgery Senile dementia  Plan: Change amiodarone to PO. Discussed with wife and son, need to hold anticoagulation for now and resume as soon as it is safe.   LOS: 1 day   Time spent including chart review, lab review, examination, discussion with patient : 30 min   Dixie Dials  MD  03/09/2022, 5:51 PM

## 2022-03-09 NOTE — Evaluation (Signed)
Occupational Therapy Evaluation Patient Details Name: Joseph [name] MRN: [id number] DOB: 1935/11/01 Today's Date: 03/09/2022   History of Present Illness 86 yo. male admitted 03/08/22, due to  Epidural hematoma with severe spinal stenosis T12-L2. Underwent T12-L5 decompression. Complicated by non-sustained VT and afib. PMH of Arthritis, atrial fibrillation, GERD, prostate cancer and s/p squamous cell carcinoma   Clinical Impression   PTA, pt lived in independent living at [place] with his wife, and was independent in ADL, IADL, and driving. Currently, pt performing functional mobility and transfers with min guard using RW. Pt educated and demonstrating oral care, brace application, toileting, and donning socks with min guard-mod A. Pt recalling 3/3 spinal precautions with increased time. Recommend discharge home with [place], and will continue to follow acutely as admitted.      Recommendations for follow up therapy are one component of a multi-disciplinary discharge planning process, led by the attending physician.  Recommendations may be updated based on patient status, additional functional criteria and insurance authorization.   Follow Up Recommendations  Home health OT    Assistance Recommended at Discharge Frequent or constant Supervision/Assistance  Patient can return home with the following A little help with walking and/or transfers;A little help with bathing/dressing/bathroom;Assistance with cooking/housework;Assist for transportation;Help with stairs or ramp for entrance    Functional Status Assessment  Patient has had a recent decline in their functional status and demonstrates the ability to make significant improvements in function in a reasonable and predictable amount of time.  Equipment Recommendations  None recommended by OT    Recommendations for Other Services       Precautions / Restrictions Precautions Precautions: Back Precaution Booklet Issued: Yes  (comment) Precaution Comments: Reviewed precautions. Pt able to recall 3/3 precautions with increased time, however, mod cues throughout session to maintain Required Braces or Orthoses: Spinal Brace Spinal Brace: Lumbar corset;Applied in sitting position Restrictions Weight Bearing Restrictions: No      Mobility Bed Mobility Overal bed mobility: Needs Assistance Bed Mobility: Rolling, Sit to Sidelying Rolling: Min guard       Sit to sidelying: Min guard General bed mobility comments: Pt recalling log roll technique, min verbal cues for technique. Min guard A for safety. Min A from son once lying in bed to achieve proper positioning of shoulders in bed.    Transfers Overall transfer level: Needs assistance Equipment used: Rolling walker (2 wheels) Transfers: Sit to/from Stand Sit to Stand: Min guard           General transfer comment: Min guard A for safety. Pt reporting he typically uses rollator. Cues for safety with RW      Balance Overall balance assessment: Needs assistance Sitting-balance support: No upper extremity supported, Feet supported Sitting balance-Leahy Scale: Fair Sitting balance - Comments: Slow LOB backward when donning socks and sitting on the edge of the recliner, back not supported. Min A to maintain upright when donning socks.   Standing balance support: No upper extremity supported, During functional activity Standing balance-Leahy Scale: Fair Standing balance comment: Grooming while standing at the sink with min guard A. Reliant on RW during dynamic activity.                           ADL either performed or assessed with clinical judgement   ADL Overall ADL's : Needs assistance/impaired     Grooming: Min guard;Standing Grooming Details (indicate cue type and reason): Min guard A for safety. Educated and demonstrating oral  care within precautions.             Lower Body Dressing: Moderate assistance;Sitting/lateral  leans Lower Body Dressing Details (indicate cue type and reason): Pt donning sock sitting EOB with mod A from son. Toilet Transfer: Min guard;Ambulation;Rolling walker (2 wheels);Regular Toilet;Grab bars Toilet Transfer Details (indicate cue type and reason): Pt performing toilet transfer with min verbal cues for adherance to back precautions. Min guard A for safety. Toileting- Water quality scientist and Hygiene: Min guard;Sitting/lateral lean Toileting - Clothing Manipulation Details (indicate cue type and reason): Performing anterior pericare with min guard A     Functional mobility during ADLs: Min guard;Rolling walker (2 wheels) General ADL Comments: Cueing for adherance to back precautions and safety throughout session.     Vision Baseline Vision/History: 1 Wears glasses Patient Visual Report: No change from baseline Vision Assessment?: No apparent visual deficits     Perception     Praxis      Pertinent Vitals/Pain Pain Assessment Pain Assessment: Faces Faces Pain Scale: Hurts even more Pain Location: back Pain Descriptors / Indicators: Aching, Burning Pain Intervention(s): Limited activity within patient's tolerance, Monitored during session, Repositioned     Hand Dominance Right   Extremity/Trunk Assessment Upper Extremity Assessment Upper Extremity Assessment: Generalized weakness   Lower Extremity Assessment Lower Extremity Assessment: Defer to PT evaluation   Cervical / Trunk Assessment Cervical / Trunk Assessment: Back Surgery   Communication Communication Communication: No difficulties   Cognition Arousal/Alertness: Awake/alert Behavior During Therapy: WFL for tasks assessed/performed Overall Cognitive Status: Difficult to assess Area of Impairment: Memory, Safety/judgement, Problem solving, Following commands                     Memory: Decreased recall of precautions Following Commands: Follows one step commands consistently, Follows one step  commands with increased time Safety/Judgement: Decreased awareness of safety   Problem Solving: Slow processing, Requires verbal cues General Comments: Family present, but not indicating pt's cognition is any different than usual. Pt recalling precautions with increased time. Pt requires verbal cues for safe use of RW and for adherance to precautions. Requiring increased time to follow commands.     General Comments  HR in 70s at rest, and inton 90s during functional mobiltiy. VSS. Wife and son present and with several questions for OT.    Exercises     Shoulder Instructions      Home Living Family/patient expects to be discharged to:: Private residence (ILF at [facility]) Living Arrangements: Spouse/significant other Available Help at Discharge: Family;Available 24 hours/day Type of Home: House Home Access: Level entry     Home Layout: One level     Bathroom Shower/Tub: Walk-in shower;Tub/shower unit   Bathroom Toilet: Handicapped height Bathroom Accessibility: Yes   Home Equipment: Conservation officer, nature (2 wheels);Cane - quad;Grab bars - tub/shower;Grab bars - toilet;BSC/3in1;Shower seat - built in   Additional Comments: [place] living facility      Prior Functioning/Environment Prior Level of Function : Independent/Modified Independent             Mobility Comments: pt walks regularly in neighborhood and participates in chair volleyball ADLs Comments: Pt reporting he was independent in ADL, IADL, and was driving.        OT Problem List: Decreased strength;Decreased activity tolerance;Impaired balance (sitting and/or standing);Decreased cognition;Decreased knowledge of precautions;Decreased knowledge of use of DME or AE;Pain      OT Treatment/Interventions: Self-care/ADL training;Therapeutic exercise;DME and/or AE instruction;Therapeutic activities;Patient/family education;Balance training    OT  Goals(Current goals can be found in the care plan  section) Acute Rehab OT Goals Patient Stated Goal: Get better OT Goal Formulation: With patient/family Time For Goal Achievement: 03/23/22 Potential to Achieve Goals: Good ADL Goals Pt Will Perform Grooming: with modified independence;standing Pt Will Perform Lower Body Dressing: with modified independence;sit to/from stand Pt Will Transfer to Toilet: with modified independence;ambulating;grab bars;regular height toilet Pt Will Perform Tub/Shower Transfer: with modified independence;Shower transfer;ambulating;shower seat;grab bars;rolling walker  OT Frequency: Min 2X/week    Co-evaluation              AM-PAC OT "6 Clicks" Daily Activity     Outcome Measure Help from another person eating meals?: A Little Help from another person taking care of personal grooming?: A Little Help from another person toileting, which includes using toliet, bedpan, or urinal?: A Little Help from another person bathing (including washing, rinsing, drying)?: A Little Help from another person to put on and taking off regular upper body clothing?: A Little Help from another person to put on and taking off regular lower body clothing?: A Lot 6 Click Score: 17   End of Session Equipment Utilized During Treatment: Gait belt;Rolling walker (2 wheels) Nurse Communication: Mobility status  Activity Tolerance: Patient tolerated treatment well Patient left: in bed;with call bell/phone within reach;with bed alarm set;with family/visitor present  OT Visit Diagnosis: Unsteadiness on feet (R26.81);Muscle weakness (generalized) (M62.81);Pain Pain - part of body:  (back/operative site)                Time: 1336-1410 OT Time Calculation (min): 34 min Charges:  OT General Charges $OT Visit: 1 Visit OT Evaluation $OT Eval Low Complexity: 1 Low OT Treatments $Self Care/Home Management : 8-22 mins  Shanda Howells, OTR/L [facility] Acute Rehabilitation Office: [phone]   Lula Olszewski 03/09/2022, 3:50 PM

## 2022-03-10 NOTE — Progress Notes (Signed)
    Subjective: Procedure(s) (LRB): THORACIC TWELVE TO LUMBAR FIVE DECOMPRESSION (N/A) 2 Days Post-Op  Patient reports pain as 2 on 0-10 scale.  Reports decreased leg pain reports incisional back pain   Positive void Negative bowel movement Positive flatus Negative chest pain or shortness of breath  Objective: Vital signs in last 24 hours: Temp:  [97.7 F (36.5 C)-98.6 F (37 C)] 98.6 F (37 C) (08/02 0428) Pulse Rate:  [59-85] 76 (08/02 0428) Resp:  [15-19] 19 (08/02 0428) BP: (102-134)/(53-84) 102/53 (08/02 0428) SpO2:  [92 %-96 %] 95 % (08/02 0428)  Intake/Output from previous day: 08/01 0701 - 08/02 0700 In: 1238 [P.O.:360; I.V.:878] Out: 260 [Urine:120; Drains:140]  Labs: Recent Labs    03/08/22 0945  WBC 7.6  RBC 4.24  HCT 43.5  PLT 210   Recent Labs    03/08/22 0945  NA 138  K 4.3  CL 104  CO2 26  BUN 12  CREATININE 1.16  GLUCOSE 113*  CALCIUM 9.2   No results for input(s): "LABPT", "INR" in the last 72 hours.  Physical Exam: Neurologically intact ABD soft Intact pulses distally Incision: dressing C/D/I and no drainage Compartment soft Body mass index is 24.86 kg/m.   Assessment/Plan: Patient stable  xrays n/a Continue mobilization with physical therapy Continue care  Drain output over last shift: 60cc.  Total 140 for last 24 hrs.  Will continue drain for now.  Consider removal when <30 per shift.   We will hold Eliquis for now.  Consider restarting tomorrow. Patient had episode of confusion last night after being woken for vital signs.  However after verbal cues were given he was reorientated to person place and time. Continue mobilization with physical therapy. Continue telemetry until cardiology indicates that it is safe to DC.  Melina Schools, MD [facility] [phone]

## 2022-03-10 NOTE — Consult Note (Signed)
Ref: [name], MD   Subjective:  Awake. Positive back pain. Atrial fibrillation continues. Heart rate is controlled.  Objective:  Vital Signs in the last 24 hours: Temp:  [97.7 F (36.5 C)-98.6 F (37 C)] 98.5 F (36.9 C) (08/02 1500) Pulse Rate:  [59-77] 67 (08/02 1500) Cardiac Rhythm: Atrial fibrillation (08/02 0845) Resp:  [14-19] 14 (08/02 1500) BP: (102-125)/(53-84) 111/77 (08/02 1500) SpO2:  [92 %-98 %] 95 % (08/02 1500)  Physical Exam: BP Readings from Last 1 Encounters:  03/10/22 111/77     Wt Readings from Last 1 Encounters:  03/08/22 69.9 kg    Weight change:  Body mass index is 24.86 kg/m. HEENT: [place]/AT, Eyes-Blue, Conjunctiva-Pink, Sclera-Non-icteric Neck: No JVD, No bruit, Trachea midline. Lungs:  Clear, Bilateral. Cardiac:  Irregular rhythm, normal S1 and S2, no S3. II/VI systolic murmur. Abdomen:  Soft, non-tender. BS present. Extremities:  No edema present. No cyanosis. No clubbing. CNS: AxOx3, Cranial nerves grossly intact, moves all 4 extremities.  Skin: Warm and dry.   Intake/Output from previous day: 08/01 0701 - 08/02 0700 In: 1238 [P.O.:360; I.V.:878] Out: 260 [Urine:120; Drains:140]    Lab Results: BMET    Component Value Date/Time   NA 138 03/08/2022 0945   NA 136 01/02/2022 0444   NA 137 01/01/2022 0530   NA 143 04/08/2021 1026   K 4.3 03/08/2022 0945   K 4.1 01/02/2022 0444   K 4.1 01/01/2022 0530   CL 104 03/08/2022 0945   CL 103 01/02/2022 0444   CL 108 01/01/2022 0530   CO2 26 03/08/2022 0945   CO2 27 01/02/2022 0444   CO2 21 (L) 01/01/2022 0530   GLUCOSE 113 (H) 03/08/2022 0945   GLUCOSE 104 (H) 01/02/2022 0444   GLUCOSE 108 (H) 01/01/2022 0530   BUN 12 03/08/2022 0945   BUN 17 01/02/2022 0444   BUN 20 01/01/2022 0530   BUN 16 04/08/2021 1026   CREATININE 1.16 03/08/2022 0945   CREATININE 0.96 01/02/2022 0444   CREATININE 0.83 01/01/2022 0530   CALCIUM 9.2 03/08/2022 0945   CALCIUM 8.3 (L) 01/02/2022 0444    CALCIUM 8.2 (L) 01/01/2022 0530   GFRNONAA >60 03/08/2022 0945   GFRNONAA >60 01/02/2022 0444   GFRNONAA >60 01/01/2022 0530   GFRAA >60 01/18/2015 0500   GFRAA >60 01/10/2015 1005   GFRAA  11/29/2010 0620    >60        The eGFR has been calculated using the MDRD equation. This calculation has not been validated in all clinical situations. eGFR's persistently <60 mL/min signify possible Chronic Kidney Disease.   CBC    Component Value Date/Time   WBC 7.6 03/08/2022 0945   RBC 4.24 03/08/2022 0945   HGB 15.0 03/08/2022 0945   HGB 14.9 01/06/2022 1139   HCT 43.5 03/08/2022 0945   HCT 42.8 01/06/2022 1139   PLT 210 03/08/2022 0945   PLT 293 01/06/2022 1139   MCV 102.6 (H) 03/08/2022 0945   MCV 100 (H) 01/06/2022 1139   MCH 35.4 (H) 03/08/2022 0945   MCHC 34.5 03/08/2022 0945   RDW 13.6 03/08/2022 0945   RDW 13.1 01/06/2022 1139   LYMPHSABS 0.8 12/31/2021 1200   MONOABS 0.8 12/31/2021 1200   EOSABS 0.0 12/31/2021 1200   BASOSABS 0.0 12/31/2021 1200   HEPATIC Function Panel Recent Labs    12/31/21 1223 01/01/22 0530 01/02/22 0444  PROT 7.6  --   --   ALBUMIN 4.3 2.8* 2.8*  AST 20  --   --  ALT 14  --   --   ALKPHOS 57  --   --   BILIDIR 0.2  --   --   IBILI 0.4  --   --    HEMOGLOBIN A1C No results found for: "MPG" CARDIAC ENZYMES No results found for: "CKTOTAL", "CKMB", "CKMBINDEX", "TROPONINI" BNP No results for input(s): "PROBNP" in the last 8760 hours. TSH Recent Labs    12/31/21 1751  TSH 1.419   CHOLESTEROL No results for input(s): "CHOL" in the last 8760 hours.  Scheduled Meds:  amiodarone  200 mg Oral Daily   diltiazem  30 mg Oral Q12H   metoprolol tartrate  50 mg Oral BID   mupirocin ointment  1 Application Nasal BID   Continuous Infusions:  PRN Meds:.acetaminophen **OR** acetaminophen, menthol-cetylpyridinium **OR** phenol, methocarbamol **OR** [DISCONTINUED] methocarbamol (ROBAXIN) IV, ondansetron **OR** ondansetron (ZOFRAN) IV,  oxyCODONE, polyethylene glycol, sodium phosphate  Assessment/Plan:  Chronic atrial fibrillation Non-sustained VT S/P back surgery Senile dementia  Plan: Consider low dose Eliquis from tomorrow if drain is stable and removed   LOS: 2 days   Time spent including chart review, lab review, examination, discussion with patient/Family : 30 min   Dixie Dials  MD  03/10/2022, 6:56 PM

## 2022-03-10 NOTE — TOC Initial Note (Addendum)
Transition of Care [facility]) - Initial/Assessment Note    Patient Details  Name: Joseph [name] MRN: [id number] Date of Birth: 09-30-1935  Transition of Care [facility]) CM/SW Contact:    Pollie Friar, RN Phone Number: 03/10/2022, 1:44 PM  Clinical Narrative:                 Normally pt is from [place] at [facility]. 2 days before admission he was in the Continuous Care part of [place] due to his increased need for assistance. CM met with the patient and his wife and they plan on him returning to the Continuous Care part of [place] before returning to their apartment. CM has called and spoken to [place], RN at [facility] about the plan and she has referred this to [place]. CM is awaiting a call back from SW at [place]. TOC following.  1635: Continuous Care at [facility] is their SNF rehab. CM spoke to [place] their SW and they are expecting him back. Cm has asked [facility] to start insurance auth through [place].    Expected Discharge Plan: Assisted Living Barriers to Discharge: Continued Medical Work up   Patient Goals and CMS Choice   CMS Medicare.gov Compare Post Acute Care list provided to:: Patient Choice offered to / list presented to : Patient, Spouse  Expected Discharge Plan and Services Expected Discharge Plan: Assisted Living   Discharge Planning Services: CM Consult   Living arrangements for the past 2 months: Apartment                                      Prior Living Arrangements/Services Living arrangements for the past 2 months: Apartment Lives with:: Spouse Patient language and need for interpreter reviewed:: Yes Do you feel safe going back to the place where you live?: Yes          Current home services: DME (rollator/ shower seat) Criminal Activity/Legal Involvement Pertinent to Current Situation/Hospitalization: No - Comment as needed  Activities of Daily Living Home Assistive Devices/Equipment: Walker (specify type), Cane (specify quad or straight),  Eyeglasses, Shower chair with back, Grab bars in shower, Grab bars around toilet, Blood pressure cuff, Raised toilet seat with rails ADL Screening (condition at time of admission) Patient's cognitive ability adequate to safely complete daily activities?: Yes Is the patient deaf or have difficulty hearing?: No Does the patient have difficulty seeing, even when wearing glasses/contacts?: No Does the patient have difficulty concentrating, remembering, or making decisions?: No Patient able to express need for assistance with ADLs?: Yes Does the patient have difficulty dressing or bathing?: Yes Independently performs ADLs?: No Communication: Independent Dressing (OT): Needs assistance Is this a change from baseline?: Pre-admission baseline Grooming: Independent Feeding: Independent Bathing: Needs assistance Is this a change from baseline?: Pre-admission baseline Toileting: Needs assistance Is this a change from baseline?: Pre-admission baseline In/Out Bed: Needs assistance Is this a change from baseline?: Pre-admission baseline Walks in Home: Needs assistance Is this a change from baseline?: Pre-admission baseline Does the patient have difficulty walking or climbing stairs?: Yes Weakness of Legs: Both Weakness of Arms/Hands: Both  Permission Sought/Granted                  Emotional Assessment Appearance:: Appears stated age Attitude/Demeanor/Rapport: Engaged Affect (typically observed): Accepting Orientation: : Oriented to Self, Oriented to Place, Oriented to  Time, Oriented to Situation   Psych Involvement: No (comment)  Admission diagnosis:  Lumbar spinal stenosis [M48.061] Patient Active Problem List   Diagnosis Date Noted   Lumbar spinal stenosis 03/08/2022   CKD (chronic kidney disease), stage II 01/05/2022   Thrombocytopenia ([place]) 01/02/2022   CAP (community acquired pneumonia) 12/31/2021   Generalized weakness 12/31/2021   AKI (acute kidney injury) ([place])  05/11/2021   Spinal stenosis 05/06/2021   Educated about COVID-19 virus infection 05/08/2020   Chronic atrial fibrillation ([place]) 05/08/2020   Prostate cancer ([place]) 01/17/2015   TMJ arthralgia 06/21/2012   Paroxysmal atrial fibrillation with RVR ([place]) 12/22/2011   Diverticulitis of colon with perforation 12/11/2010   PCP:  [name], MD Pharmacy:   [facility] [id number] [place] [address] Phone: [phone] Fax: [place] at [facility] [address] [place] [address] Phone: [phone] Fax: [phone]     Social Determinants of Health (SDOH) Interventions    Readmission Risk Interventions     No data to display

## 2022-03-10 NOTE — Progress Notes (Signed)
Physical Therapy Treatment Patient Details Name: Joseph [name] MRN: [id number] DOB: Sep 12, 1935 Today's Date: 03/10/2022   History of Present Illness 86 yo. male admitted 03/08/22, due to  Epidural hematoma with severe spinal stenosis T12-L2. Underwent T12-L5 decompression. Complicated by non-sustained VT and afib. PMH of Arthritis, atrial fibrillation, GERD, prostate cancer and s/p squamous cell carcinoma    PT Comments    Patient progressing with ambulation distance in hallway.  He had more back pain today with MD discontinuing some of narcotics.  Discussed d/c options with pt and family due to came from skilled care at [facility].  Feel he will continue to benefit from skilled care at d/c prior to home as wife concerned about careing for him at home.  PT will continue to follow.    Recommendations for follow up therapy are one component of a multi-disciplinary discharge planning process, led by the attending physician.  Recommendations may be updated based on patient status, additional functional criteria and insurance authorization.  Follow Up Recommendations  Skilled nursing-short term rehab (<3 hours/day) Can patient physically be transported by private vehicle: Yes   Assistance Recommended at Discharge Intermittent Supervision/Assistance  Patient can return home with the following A little help with walking and/or transfers;A little help with bathing/dressing/bathroom;Assist for transportation   Equipment Recommendations  None recommended by PT    Recommendations for Other Services       Precautions / Restrictions Precautions Precautions: Fall;Back Precaution Comments: cues for precautions Required Braces or Orthoses: Spinal Brace Spinal Brace: Lumbar corset;Applied in sitting position     Mobility  Bed Mobility Overal bed mobility: Needs Assistance Bed Mobility: Rolling, Sidelying to Sit, Sit to Sidelying Rolling: Supervision Sidelying to sit: Supervision     Sit to  sidelying: Supervision General bed mobility comments: cues for technique during practice for bed mobility; assist for repositioning shoulders after supine due to diagonal in the bed    Transfers Overall transfer level: Needs assistance Equipment used: Rolling walker (2 wheels) Transfers: Sit to/from Stand Sit to Stand: Min guard           General transfer comment: up from recliner assist for balance    Ambulation/Gait Ambulation/Gait assistance: Min guard, Supervision Gait Distance (Feet): 150 Feet Assistive device: Rolling walker (2 wheels) Gait Pattern/deviations: Step-through pattern, Decreased stride length, Shuffle, Trunk flexed       General Gait Details: cues for posture, balance and stride length   Stairs             Wheelchair Mobility    Modified Rankin (Stroke Patients Only)       Balance Overall balance assessment: Needs assistance Sitting-balance support: Feet supported Sitting balance-Leahy Scale: Good     Standing balance support: No upper extremity supported Standing balance-Leahy Scale: Fair Standing balance comment: standing at bedside no UE support with S only                            Cognition Arousal/Alertness: Awake/alert Behavior During Therapy: Flat affect Overall Cognitive Status: Impaired/Different from baseline Area of Impairment: Memory, Safety/judgement, Problem solving                     Memory: Decreased recall of precautions, Decreased short-term memory Following Commands: Follows one step commands with increased time, Follows one step commands consistently Safety/Judgement: Decreased awareness of safety   Problem Solving: Slow processing, Requires verbal cues  Exercises      General Comments General comments (skin integrity, edema, etc.): son and wife present, discussed d/c plans and with differing opinions      Pertinent Vitals/Pain Pain Assessment Pain Score: 8  Pain  Location: back Pain Descriptors / Indicators: Aching, Grimacing Pain Intervention(s): Monitored during session, Patient requesting pain meds-RN notified    Home Living                          Prior Function            PT Goals (current goals can now be found in the care plan section) Progress towards PT goals: Progressing toward goals    Frequency           PT Plan Discharge plan needs to be updated    Co-evaluation              AM-PAC PT "6 Clicks" Mobility   Outcome Measure  Help needed turning from your back to your side while in a flat bed without using bedrails?: A Little Help needed moving from lying on your back to sitting on the side of a flat bed without using bedrails?: A Little Help needed moving to and from a bed to a chair (including a wheelchair)?: A Little Help needed standing up from a chair using your arms (e.g., wheelchair or bedside chair)?: A Little Help needed to walk in hospital room?: A Little Help needed climbing 3-5 steps with a railing? : A Lot 6 Click Score: 17    End of Session Equipment Utilized During Treatment: Gait belt;Back brace Activity Tolerance: Patient limited by pain Patient left: in bed;with call bell/phone within reach;with family/visitor present   PT Visit Diagnosis: Other abnormalities of gait and mobility (R26.89);Pain;Muscle weakness (generalized) (M62.81) Pain - part of body:  (back)     Time: 2878-6767 PT Time Calculation (min) (ACUTE ONLY): 27 min  Charges:  $Gait Training: 8-22 mins $Therapeutic Activity: 8-22 mins                     Magda Kiel, PT Acute Rehabilitation Services [phone] 03/10/2022    Reginia Naas 03/10/2022, 5:20 PM

## 2022-03-10 NOTE — NC FL2 (Signed)
[place] LEVEL OF CARE SCREENING TOOL     IDENTIFICATION  Patient Name: Joseph [name] Birthdate: 04/18/1936 Sex: male Admission Date (Current Location): 03/08/2022  [facility] and [place] Number:  Herbalist and Address:  [facility] [place] [place] [place] [address] [place], [place] [address]      Provider Number: [id number]  Attending Physician Name and Address:  Melina Schools, MD  Relative Name and Phone Number:       Current Level of Care: Hospital Recommended Level of Care: [place] Prior Approval Number:    Date Approved/Denied:   PASRR Number: [id number]  Discharge Plan: SNF    Current Diagnoses: Patient Active Problem List   Diagnosis Date Noted   Lumbar spinal stenosis 03/08/2022   CKD (chronic kidney disease), stage II 01/05/2022   Thrombocytopenia ([place]) 01/02/2022   CAP (community acquired pneumonia) 12/31/2021   Generalized weakness 12/31/2021   AKI (acute kidney injury) ([place]) 05/11/2021   Spinal stenosis 05/06/2021   Educated about COVID-19 virus infection 05/08/2020   Chronic atrial fibrillation ([place]) 05/08/2020   Prostate cancer ([place]) 01/17/2015   TMJ arthralgia 06/21/2012   Paroxysmal atrial fibrillation with RVR ([place]) 12/22/2011   Diverticulitis of colon with perforation 12/11/2010    Orientation RESPIRATION BLADDER Height & Weight     Self, Time, Situation, Place  Normal Continent Weight: 69.9 kg Height:  '5\' 6"'$  (167.6 cm)  BEHAVIORAL SYMPTOMS/MOOD NEUROLOGICAL BOWEL NUTRITION STATUS      Continent Diet (Regular with thin liquids)  AMBULATORY STATUS COMMUNICATION OF NEEDS Skin   Limited Assist Verbally Surgical wounds (dressing to back)                       Personal Care Assistance Level of Assistance  Bathing, Feeding, Dressing Bathing Assistance: Limited assistance Feeding assistance: Independent Dressing Assistance: Limited assistance     Functional Limitations Info  Sight, Hearing,  Speech Sight Info: Impaired Hearing Info: Adequate Speech Info: Adequate    SPECIAL CARE FACTORS FREQUENCY  PT (By licensed PT), OT (By licensed OT)     PT Frequency: 5x/wk OT Frequency: 5x/wk            Contractures Contractures Info: Not present    Additional Factors Info  Code Status, Allergies Code Status Info: Full code Allergies Info: Poison Ivy/ Tetracycline           Current Medications (03/10/2022):  This is the current hospital active medication list Current Facility-Administered Medications  Medication Dose Route Frequency Provider Last Rate Last Admin   0.9 %  sodium chloride infusion   Intravenous Continuous Melina Schools, MD       acetaminophen (TYLENOL) tablet 650 mg  650 mg Oral Q4H PRN Melina Schools, MD   650 mg at 03/10/22 4128   Or   acetaminophen (TYLENOL) suppository 650 mg  650 mg Rectal Q4H PRN Melina Schools, MD       amiodarone (PACERONE) tablet 200 mg  200 mg Oral Daily Dixie Dials, MD   200 mg at 03/10/22 0858   diltiazem (CARDIZEM) tablet 30 mg  30 mg Oral Q12H Dixie Dials, MD   30 mg at 03/10/22 7867   menthol-cetylpyridinium (CEPACOL) lozenge 3 mg  1 lozenge Oral PRN Melina Schools, MD       Or   phenol (CHLORASEPTIC) mouth spray 1 spray  1 spray Mouth/Throat PRN Melina Schools, MD       methocarbamol (ROBAXIN) tablet 500 mg  500 mg Oral Q8H PRN Melina Schools, MD       metoprolol tartrate (LOPRESSOR) tablet 50 mg  50 mg Oral BID Melina Schools, MD   50 mg at 03/10/22 0858   mupirocin ointment (BACTROBAN) 2 % 1 Application  1 Application Nasal BID Melina Schools, MD   1 Application at 72/82/06 0859   ondansetron (ZOFRAN) tablet 4 mg  4 mg Oral Q6H PRN Melina Schools, MD       Or   ondansetron [facility]) injection 4 mg  4 mg Intravenous Q6H PRN Melina Schools, MD       oxyCODONE (Oxy IR/ROXICODONE) immediate release tablet 5 mg  5 mg Oral Q3H PRN Melina Schools, MD   5 mg at 03/08/22 1915   polyethylene glycol (MIRALAX / GLYCOLAX) packet 17  g  17 g Oral Daily PRN Melina Schools, MD   17 g at 03/10/22 0859   sodium chloride flush (NS) 0.9 % injection 3 mL  3 mL Intravenous Q12H Melina Schools, MD   3 mL at 03/10/22 0859   sodium chloride flush (NS) 0.9 % injection 3 mL  3 mL Intravenous PRN Melina Schools, MD       sodium phosphate (FLEET) 7-19 GM/118ML enema 1 enema  1 enema Rectal Once PRN Melina Schools, MD         Discharge Medications: Please see discharge summary for a list of discharge medications.  Relevant Imaging Results:  Relevant Lab Results:   Additional Information SS#: [id number]  Pollie Friar, RN

## 2022-03-10 NOTE — Progress Notes (Signed)
Occupational Therapy Treatment Patient Details Name: Joseph [name] MRN: [id number] DOB: 1935-09-24 Today's Date: 03/10/2022   History of present illness 86 yo. male admitted 03/08/22, due to  Epidural hematoma with severe spinal stenosis T12-L2. Underwent T12-L5 decompression. Complicated by non-sustained VT and afib. PMH of Arthritis, atrial fibrillation, GERD, prostate cancer and s/p squamous cell carcinoma   OT comments  Pt greeted supine in bed with son present, pt agreeable to OT intervention. Session focus on BADL reeducation, functional mobility, dynamic standing balance, LB AE training, DME education/ back precaution education and decreasing overall caregiver burden.  Pt continues to present with increased pain,back precautions and impaired balance impacting pt ability to complete BADL independently. Pt able to state 3/3 back precautions but need MOD verbal cues to recall precautions during ADLs/functional mobility. Pt currently requires MIN A for UB ADLS and MOD A for LB ADLS with AE, full demo and education provided on all LB AE. Pt even practiced stepping into walkin shower with grab bars with min guard assist. Pt would continue to benefit from skilled occupational therapy while admitted and after d/c to address the below listed limitations in order to improve overall functional mobility and facilitate independence with BADL participation. DC plan remains appropriate, will follow acutely per POC.                     Recommendations for follow up therapy are one component of a multi-disciplinary discharge planning process, led by the attending physician.  Recommendations may be updated based on patient status, additional functional criteria and insurance authorization.    Follow Up Recommendations  Home health OT    Assistance Recommended at Discharge Frequent or constant Supervision/Assistance  Patient can return home with the following  A little help with walking and/or transfers;A little  help with bathing/dressing/bathroom;Assistance with cooking/housework;Assist for transportation;Help with stairs or ramp for entrance   Equipment Recommendations  None recommended by OT    Recommendations for Other Services      Precautions / Restrictions Precautions Precautions: Back Precaution Booklet Issued: Yes (comment) Precaution Comments: Reviewed precautions. Pt able to recall 3/3 precautions with increased time, however, mod cues throughout session to maintain Required Braces or Orthoses: Spinal Brace Spinal Brace: Lumbar corset;Applied in sitting position Restrictions Weight Bearing Restrictions: No       Mobility Bed Mobility Overal bed mobility: Needs Assistance Bed Mobility: Rolling, Sit to Sidelying Rolling: Min guard Sidelying to sit: Min guard       General bed mobility comments: + time and effort, rest break needed in between sidelying>sit d/t pain    Transfers Overall transfer level: Needs assistance Equipment used: Rolling walker (2 wheels) Transfers: Sit to/from Stand Sit to Stand: Min guard           General transfer comment: min guard to rise from EOB and toilet with use of grab bars for safety     Balance Overall balance assessment: Needs assistance Sitting-balance support: No upper extremity supported, Feet supported Sitting balance-Leahy Scale: Fair     Standing balance support: No upper extremity supported, During functional activity Standing balance-Leahy Scale: Fair Standing balance comment: Grooming while standing at the sink with min guard A. Reliant on RW during dynamic activity.                           ADL either performed or assessed with clinical judgement   ADL Overall ADL's : Needs assistance/impaired  Grooming: Wash/dry hands;Standing;Supervision/safety         Lower Body Bathing Details (indicate cue type and reason): demo and education proivded on LH sponge, son ordered Upper Body Dressing :  Minimal assistance;Sitting Upper Body Dressing Details (indicate cue type and reason): to don brace from EOB Lower Body Dressing: Moderate assistance;Sit to/from stand;Cueing for safety;Cueing for sequencing;With adaptive equipment Lower Body Dressing Details (indicate cue type and reason): to don pants from EOB with use of reacher Toilet Transfer: Min guard;Ambulation;Rolling walker (2 wheels);Regular Toilet;Grab bars Toilet Transfer Details (indicate cue type and reason): Pt performing toilet transfer with min verbal cues for adherance to back precautions. Min guard A for safety. Toileting- Water quality scientist and Hygiene: Min guard;Sitting/lateral lean   Tub/ Shower Transfer: Walk-in shower;Min guard;BSC/3in1;Ambulation Tub/Shower Transfer Details (indicate cue type and reason): min guard for safety to practice ambulatory shower transfer with Rw, cues for sequencing and safety Functional mobility during ADLs: Min guard;Rolling walker (2 wheels) General ADL Comments: pt needs cues for recall of back precautions during ADL participation    Extremity/Trunk Assessment Upper Extremity Assessment Upper Extremity Assessment: Generalized weakness   Lower Extremity Assessment Lower Extremity Assessment: Defer to PT evaluation   Cervical / Trunk Assessment Cervical / Trunk Assessment: Back Surgery    Vision Baseline Vision/History: 1 Wears glasses Patient Visual Report: No change from baseline Vision Assessment?: No apparent visual deficits   Perception Perception Perception: Within Functional Limits   Praxis Praxis Praxis: Intact    Cognition Arousal/Alertness: Awake/alert Behavior During Therapy: WFL for tasks assessed/performed Overall Cognitive Status: Impaired/Different from baseline Area of Impairment: Memory, Safety/judgement, Problem solving                     Memory: Decreased recall of precautions, Decreased short-term memory   Safety/Judgement: Decreased  awareness of safety   Problem Solving: Slow processing, Requires verbal cues General Comments: Family present, but not indicating pt's cognition is any different than usual. Pt recalling precautions with increased time. Pt requires verbal cues for safe use of RW and for adherance to precautions. Requiring increased time to follow commands.        Exercises      Shoulder Instructions       General Comments VSS on RA, son present durin session, reviewed all AE son ordered all needed DME as well as bed rail for supine>sit from flat [facility]    Pertinent Vitals/ Pain       Pain Assessment Pain Assessment: Faces Faces Pain Scale: Hurts even more Pain Location: back Pain Descriptors / Indicators: Aching, Grimacing Pain Intervention(s): Limited activity within patient's tolerance, Monitored during session, Repositioned  Home Living                                          Prior Functioning/Environment              Frequency  Min 2X/week        Progress Toward Goals  OT Goals(current goals can now be found in the care plan section)  Progress towards OT goals: Progressing toward goals  Acute Rehab OT Goals Patient Stated Goal: to feel better OT Goal Formulation: With patient/family Time For Goal Achievement: 03/23/22 Potential to Achieve Goals: Good  Plan Discharge plan remains appropriate;Frequency remains appropriate    Co-evaluation  AM-PAC OT "6 Clicks" Daily Activity     Outcome Measure   Help from another person eating meals?: None Help from another person taking care of personal grooming?: A Little Help from another person toileting, which includes using toliet, bedpan, or urinal?: A Little Help from another person bathing (including washing, rinsing, drying)?: A Little Help from another person to put on and taking off regular upper body clothing?: A Little Help from another person to put on and taking off regular lower  body clothing?: A Little 6 Click Score: 19    End of Session Equipment Utilized During Treatment: Rolling walker (2 wheels);Back brace  OT Visit Diagnosis: Unsteadiness on feet (R26.81);Muscle weakness (generalized) (M62.81);Pain Pain - part of body:  (back)   Activity Tolerance Patient tolerated treatment well   Patient Left in chair;with call bell/phone within reach   Nurse Communication Mobility status        Time: 0973-5329 OT Time Calculation (min): 49 min  Charges: OT General Charges $OT Visit: 1 Visit OT Treatments $Self Care/Home Management : 38-52 mins  Harley Alto., COTA/L Acute Rehabilitation Services [phone]  Precious Haws 03/10/2022, 12:22 PM

## 2022-03-11 DIAGNOSIS — R531 Weakness: Secondary | ICD-10-CM | POA: Diagnosis not present

## 2022-03-11 DIAGNOSIS — I480 Paroxysmal atrial fibrillation: Secondary | ICD-10-CM | POA: Diagnosis not present

## 2022-03-11 DIAGNOSIS — M48061 Spinal stenosis, lumbar region without neurogenic claudication: Secondary | ICD-10-CM | POA: Diagnosis not present

## 2022-03-11 LAB — COMPREHENSIVE METABOLIC PANEL
ALT: 12 U/L (ref 0–44)
AST: 20 U/L (ref 15–41)
Albumin: 3.1 g/dL — ABNORMAL LOW (ref 3.5–5.0)
Alkaline Phosphatase: 36 U/L — ABNORMAL LOW (ref 38–126)
Anion gap: 10 (ref 5–15)
BUN: 14 mg/dL (ref 8–23)
CO2: 28 mmol/L (ref 22–32)
Calcium: 8.7 mg/dL — ABNORMAL LOW (ref 8.9–10.3)
Chloride: 100 mmol/L (ref 98–111)
Creatinine, Ser: 1.09 mg/dL (ref 0.61–1.24)
GFR, Estimated: >60 mL/min (ref >60)
Glucose, Bld: 100 mg/dL — ABNORMAL HIGH (ref 70–99)
Potassium: 4.2 mmol/L (ref 3.5–5.1)
Sodium: 138 mmol/L (ref 135–145)
Total Bilirubin: 1.4 mg/dL — ABNORMAL HIGH (ref 0.3–1.2)
Total Protein: 5.4 g/dL — ABNORMAL LOW (ref 6.5–8.1)

## 2022-03-11 LAB — CBC
HCT: 34.7 % — ABNORMAL LOW (ref 39.0–52.0)
Hemoglobin: 11.9 g/dL — ABNORMAL LOW (ref 13.0–17.0)
MCH: 35.5 pg — ABNORMAL HIGH (ref 26.0–34.0)
MCHC: 34.3 g/dL (ref 30.0–36.0)
MCV: 103.6 fL — ABNORMAL HIGH (ref 80.0–100.0)
Platelets: 175 10*3/uL (ref 150–400)
RBC: 3.35 MIL/uL — ABNORMAL LOW (ref 4.22–5.81)
RDW: 13.7 % (ref 11.5–15.5)
WBC: 9.2 10*3/uL (ref 4.0–10.5)
nRBC: 0 % (ref 0.0–0.2)

## 2022-03-11 NOTE — TOC Progression Note (Signed)
Transition of Care [facility]) - Progression Note    Patient Details  Name: Joseph [name] MRN: [id number] Date of Birth: [birth year]  Transition of [facility]) CM/SW Contact  Pollie Friar, RN Phone Number: 03/11/2022, 11:06 AM  Clinical Narrative:    Pt has been approved for SNF rehab at [facility]: approved 8/2 -8/4 [place] ID [id number] next review date 8/4   Expected Discharge Plan: Assisted Living Barriers to Discharge: Continued Medical Work up  Expected Discharge Plan and Services Expected Discharge Plan: Assisted Living   Discharge Planning Services: CM Consult   Living arrangements for the past 2 months: Apartment                                       Social Determinants of Health (SDOH) Interventions    Readmission Risk Interventions     No data to display

## 2022-03-11 NOTE — Progress Notes (Signed)
    Subjective: Procedure(s) (LRB): THORACIC TWELVE TO LUMBAR FIVE DECOMPRESSION (N/A) 3 Days Post-Op  Patient reports pain as 2 on 0-10 scale.  Reports decreased leg pain reports incisional back pain   Positive void Positive bowel movement Positive flatus Negative chest pain or shortness of breath  Objective: Vital signs in last 24 hours: Temp:  [97.8 F (36.6 C)-98.7 F (37.1 C)] 97.8 F (36.6 C) (08/03 1654) Pulse Rate:  [53-88] 70 (08/03 1654) Resp:  [16-20] 20 (08/03 1654) BP: (116-130)/(59-88) 116/61 (08/03 1654) SpO2:  [92 %-96 %] 96 % (08/03 1654)  Intake/Output from previous day: 08/02 0701 - 08/03 0700 In: 560 [P.O.:560] Out: 15 [Drains:15]  Labs: Recent Labs    03/11/22 1212  WBC 9.2  RBC 3.35*  HCT 34.7*  PLT 175   Recent Labs    03/11/22 1212  NA 138  K 4.2  CL 100  CO2 28  BUN 14  CREATININE 1.09  GLUCOSE 100*  CALCIUM 8.7*   No results for input(s): "LABPT", "INR" in the last 72 hours.  Physical Exam: Neurologically intact ABD soft Intact pulses distally Incision: dressing C/D/I Compartment soft Body mass index is 24.86 kg/m.   Assessment/Plan: Patient stable  xrays n/a Continue mobilization with physical therapy Continue care    Output is minimal.  The drain was removed without incident 2.  Reviewed cardiology note.  Okay to start low-dose Eliquis tomorrow. 3.  Continue mobilization with physical therapy. 4.  Patient is improving.  Pain is well controlled.  Plan on possible discharge tomorrow afternoon if the patient is cleared from cardiology standpoint and tolerating anticoagulation.  Melina Schools, MD [facility] [phone]

## 2022-03-11 NOTE — Care Management Important Message (Signed)
Important Message  Patient Details  Name: Joseph [name] MRN: [id number] Date of Birth: 03-21-1936   Medicare Important Message Given:  Yes     Crawford Tamura 03/11/2022, 2:15 PM

## 2022-03-11 NOTE — Progress Notes (Signed)
Occupational Therapy Treatment Patient Details Name: Joseph [name] MRN: [id number] DOB: [birth year] Today's Date: 03/11/2022   History of present illness 86 yo. male admitted 03/08/22, due to  Epidural hematoma with severe spinal stenosis T12-L2. Underwent T12-L5 decompression. Complicated by non-sustained VT and afib. PMH of Arthritis, atrial fibrillation, GERD, prostate cancer and s/p squamous cell carcinoma   OT comments  Pt progressing towards established OT goals. Focusing session on LB dressing, functional transfers, bed mobility, and functional mobility. Pt performing LB and UB dressing with min A this session. Pt with greater skill to use reacher and sock aid to don LB dressing this session, and sitting in arm chair for support with balance per son request. Pt performing functional transfers with min guard A and bed mobility with supervision A. Pt required min verbal cues for adherence to back precautions during ADL and bed mobility. Discussed discharge with family, and pt highly requesting to discharge to SNF on same campus as his ILF in order to decrease caregiver burden for his wife. Pt would benefit from skilled OT services at SNF to optimize safety and independence in ADL and IADL.    Recommendations for follow up therapy are one component of a multi-disciplinary discharge planning process, led by the attending physician.  Recommendations may be updated based on patient status, additional functional criteria and insurance authorization.    Follow Up Recommendations  Skilled nursing-short term rehab (<3 hours/day)    Assistance Recommended at Discharge Frequent or constant Supervision/Assistance  Patient can return home with the following  A little help with walking and/or transfers;A little help with bathing/dressing/bathroom;Assistance with cooking/housework;Assist for transportation;Help with stairs or ramp for entrance   Equipment Recommendations  None recommended by OT     Recommendations for Other Services      Precautions / Restrictions Precautions Precautions: Fall;Back Precaution Booklet Issued:  (Provided at earlier date) Precaution Comments: cues for precautions Required Braces or Orthoses: Spinal Brace Spinal Brace: Lumbar corset;Applied in sitting position Restrictions Weight Bearing Restrictions: No       Mobility Bed Mobility Overal bed mobility: Needs Assistance Bed Mobility: Rolling, Sit to Sidelying Rolling: Supervision       Sit to sidelying: Supervision      Transfers Overall transfer level: Needs assistance Equipment used: Rolling walker (2 wheels) Transfers: Sit to/from Stand Sit to Stand: Min guard           General transfer comment: Min guard. Pushing up fron arm rests on chair or from surface of bed.     Balance Overall balance assessment: Needs assistance Sitting-balance support: Feet supported Sitting balance-Leahy Scale: Fair Sitting balance - Comments: sitting EOB with no LOB, however, requiring chair with back support to don LB dressing   Standing balance support: No upper extremity supported Standing balance-Leahy Scale: Fair Standing balance comment: standing at bedside no UE support with S only                           ADL either performed or assessed with clinical judgement   ADL Overall ADL's : Needs assistance/impaired     Grooming: Wash/dry hands;Standing;Supervision/safety Grooming Details (indicate cue type and reason): Washing hands on arrival         Upper Body Dressing : Minimal assistance;Standing Upper Body Dressing Details (indicate cue type and reason): To don brace. Pt was in hall without brace. reinforced taht should don in sitting Lower Body Dressing: Sit to/from stand;Minimal assistance Lower Body Dressing  Details (indicate cue type and reason): Using AE to don pants/underpants with min A dur to socks sticking to pants when using reacher to don pants. Sitting in  chair with back rest Toilet Transfer: Min guard;Ambulation;Rolling walker (2 wheels);Regular Toilet;Grab bars Toilet Transfer Details (indicate cue type and reason): Performing simulated toilet transfer with min verbal cues for safety         Functional mobility during ADLs: Min guard;Rolling walker (2 wheels) General ADL Comments: requires cues for back precautions during ADL    Extremity/Trunk Assessment Upper Extremity Assessment Upper Extremity Assessment: Generalized weakness   Lower Extremity Assessment Lower Extremity Assessment: Defer to PT evaluation        Vision   Vision Assessment?: No apparent visual deficits   Perception Perception Perception: Within Functional Limits   Praxis Praxis Praxis: Intact    Cognition Arousal/Alertness: Awake/alert Behavior During Therapy: WFL for tasks assessed/performed Overall Cognitive Status: Impaired/Different from baseline Area of Impairment: Memory, Safety/judgement, Problem solving                     Memory: Decreased recall of precautions, Decreased short-term memory Following Commands: Follows one step commands with increased time, Follows one step commands consistently     Problem Solving: Slow processing, Requires verbal cues General Comments: Pt recalling precautions with increased time. Min verbal cues for RW management. Slightly increased time to follow commands        Exercises Exercises: General Lower Extremity    Shoulder Instructions       General Comments son and wife present. Discussed discharge plans and pt adamant that he believes he needs stay at SNF    Pertinent Vitals/ Pain       Pain Assessment Pain Assessment: Faces Faces Pain Scale: Hurts little more Pain Location: back Pain Descriptors / Indicators: Aching Pain Intervention(s): Limited activity within patient's tolerance, Repositioned, Monitored during session  Home Living                                           Prior Functioning/Environment              Frequency  Min 2X/week        Progress Toward Goals  OT Goals(current goals can now be found in the care plan section)  Progress towards OT goals: Progressing toward goals  Acute Rehab OT Goals Patient Stated Goal: Not to burden wife OT Goal Formulation: With patient/family Time For Goal Achievement: 03/23/22 Potential to Achieve Goals: Good ADL Goals Pt Will Perform Grooming: with modified independence;standing Pt Will Perform Lower Body Dressing: with modified independence;sit to/from stand Pt Will Transfer to Toilet: with modified independence;ambulating;grab bars;regular height toilet Pt Will Perform Tub/Shower Transfer: with modified independence;Shower transfer;ambulating;shower seat;grab bars;rolling walker  Plan Frequency remains appropriate;Discharge plan needs to be updated    Co-evaluation                 AM-PAC OT "6 Clicks" Daily Activity     Outcome Measure   Help from another person eating meals?: None Help from another person taking care of personal grooming?: A Little Help from another person toileting, which includes using toliet, bedpan, or urinal?: A Little Help from another person bathing (including washing, rinsing, drying)?: A Little Help from another person to put on and taking off regular upper body clothing?: A Little Help from another person to put  on and taking off regular lower body clothing?: A Little 6 Click Score: 19    End of Session Equipment Utilized During Treatment: Rolling walker (2 wheels);Back brace;Gait belt  OT Visit Diagnosis: Unsteadiness on feet (R26.81);Muscle weakness (generalized) (M62.81);Pain Pain - part of body:  (back)   Activity Tolerance Patient tolerated treatment well   Patient Left in bed;with call bell/phone within reach   Nurse Communication Mobility status        Time: 1173-5670 OT Time Calculation (min): 25 min  Charges: OT General  Charges $OT Visit: 1 Visit OT Treatments $Self Care/Home Management : 8-22 mins $Therapeutic Activity: 8-22 mins  Shanda Howells, OTR/L [facility] Acute Rehabilitation Office: [phone]   Lula Olszewski 03/11/2022, 3:13 PM

## 2022-03-11 NOTE — Progress Notes (Signed)
Physical Therapy Treatment Patient Details Name: Joseph [name] MRN: [id number] DOB: 1935-11-23 Today's Date: 03/11/2022   History of Present Illness 86 yo. male admitted 03/08/22, due to  Epidural hematoma with severe spinal stenosis T12-L2. Underwent T12-L5 decompression. Complicated by non-sustained VT and afib. PMH of Arthritis, atrial fibrillation, GERD, prostate cancer and s/p squamous cell carcinoma    PT Comments    Continues to participate and improve with acute rehab, progressing towards goals. Amb in hallway with min guard assist, cues for safety and RW use. Reviewed bed mobility using log-roll technique. Reviewed seated exercises to perform periodically throughout the day for LE strengthening. Patient will continue to benefit from skilled physical therapy services to further improve independence with functional mobility.    Recommendations for follow up therapy are one component of a multi-disciplinary discharge planning process, led by the attending physician.  Recommendations may be updated based on patient status, additional functional criteria and insurance authorization.  Follow Up Recommendations  Skilled nursing-short term rehab (<3 hours/day) Can patient physically be transported by private vehicle: Yes   Assistance Recommended at Discharge Intermittent Supervision/Assistance  Patient can return home with the following A little help with walking and/or transfers;A little help with bathing/dressing/bathroom;Assist for transportation   Equipment Recommendations  None recommended by PT    Recommendations for Other Services       Precautions / Restrictions Precautions Precautions: Fall;Back Precaution Booklet Issued: Yes (comment) Precaution Comments: cues for precautions Required Braces or Orthoses: Spinal Brace Spinal Brace: Lumbar corset;Applied in sitting position Restrictions Weight Bearing Restrictions: No     Mobility  Bed Mobility Overal bed mobility: Needs  Assistance Bed Mobility: Rolling, Sidelying to Sit Rolling: Supervision Sidelying to sit: Supervision       General bed mobility comments: Reviewed log roll technique, VC required for sequencing. Using rail to pull himself over. supervision for safety, effortful and painful when pushing up but safely maintains back precautions with this technique.    Transfers Overall transfer level: Needs assistance Equipment used: Rolling walker (2 wheels) Transfers: Sit to/from Stand Sit to Stand: Min guard           General transfer comment: Min guard for safety from bed. Recalls handplacement. Good control with descent into chair.    Ambulation/Gait Ambulation/Gait assistance: Min guard Gait Distance (Feet): 195 Feet Assistive device: Rolling walker (2 wheels) Gait Pattern/deviations: Step-through pattern, Decreased stride length, Shuffle, Trunk flexed, Drifts right/left Gait velocity: slow Gait velocity interpretation: <1.31 ft/sec, indicative of household ambulator   General Gait Details: cues for posture, and increased step length. looking down frequently. Some drifting noted. Cues to bring RW closer to proximity at times to improve control. No buckling or LOB with this device.   Stairs             Wheelchair Mobility    Modified Rankin (Stroke Patients Only)       Balance Overall balance assessment: Needs assistance Sitting-balance support: Feet supported Sitting balance-Leahy Scale: Good Sitting balance - Comments: Tol sitting EOB without LOB today.   Standing balance support: No upper extremity supported Standing balance-Leahy Scale: Fair Standing balance comment: standing at bedside no UE support with S only                            Cognition Arousal/Alertness: Awake/alert Behavior During Therapy: Flat affect Overall Cognitive Status: Impaired/Different from baseline Area of Impairment: Memory, Safety/judgement, Problem solving  Memory: Decreased recall of precautions, Decreased short-term memory Following Commands: Follows one step commands with increased time, Follows one step commands consistently Safety/Judgement: Decreased awareness of safety   Problem Solving: Slow processing, Requires verbal cues General Comments: Pt recalling precautions with increased time. Pt requires verbal cues for safe use of RW and for adherance to precautions. Requiring increased time to follow commands.        Exercises General Exercises - Lower Extremity Ankle Circles/Pumps: AROM, Both, 10 reps, Seated Quad Sets: Strengthening, Both, 5 reps, Seated Gluteal Sets: Strengthening, Both, 5 reps, Seated    General Comments General comments (skin integrity, edema, etc.): Reviewed donning back brace and adjustments. Son present supportive, encouraging to pt.      Pertinent Vitals/Pain Pain Assessment Pain Assessment: Faces Faces Pain Scale: Hurts little more Pain Location: back Pain Descriptors / Indicators: Aching    Home Living                          Prior Function            PT Goals (current goals can now be found in the care plan section) Acute Rehab PT Goals Patient Stated Goal: Get well, return home PT Goal Formulation: With patient Time For Goal Achievement: 03/16/22 Potential to Achieve Goals: Good Progress towards PT goals: Progressing toward goals    Frequency    Min 4X/week      PT Plan Current plan remains appropriate    Co-evaluation              AM-PAC PT "6 Clicks" Mobility   Outcome Measure  Help needed turning from your back to your side while in a flat bed without using bedrails?: A Little Help needed moving from lying on your back to sitting on the side of a flat bed without using bedrails?: A Little Help needed moving to and from a bed to a chair (including a wheelchair)?: A Little Help needed standing up from a chair using your arms (e.g., wheelchair or  bedside chair)?: A Little Help needed to walk in hospital room?: A Little Help needed climbing 3-5 steps with a railing? : A Lot 6 Click Score: 17    End of Session Equipment Utilized During Treatment: Gait belt;Back brace Activity Tolerance: Patient tolerated treatment well (Further distance limited by back pain.) Patient left: with call bell/phone within reach;with family/visitor present;in chair;with SCD's reapplied   PT Visit Diagnosis: Other abnormalities of gait and mobility (R26.89);Pain;Muscle weakness (generalized) (M62.81) Pain - part of body:  (back)     Time: 2585-2778 PT Time Calculation (min) (ACUTE ONLY): 35 min  Charges:  $Gait Training: 8-22 mins $Therapeutic Activity: 8-22 mins                     Candie Mile, PT    Ellouise Newer 03/11/2022, 9:40 AM

## 2022-03-11 NOTE — Consult Note (Signed)
Ref: [name], MD   Subjective:  Awake. Drainage continues. Atrial fibrillation continues with controlled heart rate of 80's.  Objective:  Vital Signs in the last 24 hours: Temp:  [98.3 F (36.8 C)-98.7 F (37.1 C)] 98.6 F (37 C) (08/03 0915) Pulse Rate:  [53-88] 80 (08/03 0915) Cardiac Rhythm: Atrial fibrillation (08/03 0700) Resp:  [14-20] 16 (08/03 0915) BP: (111-130)/(59-88) 118/59 (08/03 0915) SpO2:  [94 %-96 %] 96 % (08/03 0915)  Physical Exam: BP Readings from Last 1 Encounters:  03/11/22 (!) 118/59     Wt Readings from Last 1 Encounters:  03/08/22 69.9 kg    Weight change:  Body mass index is 24.86 kg/m. HEENT: [place]/AT, Eyes-Blue, wears glasses, Conjunctiva-Pink, Sclera-Non-icteric Neck: No JVD, No bruit, Trachea midline. Lungs:  Clear, Bilateral. Cardiac:  Regular rhythm, normal S1 and S2, no S3. II/VI systolic murmur. Abdomen:  Soft, non-tender. BS present. Extremities:  No edema present. No cyanosis. No clubbing. CNS: AxOx3, Cranial nerves grossly intact, moves all 4 extremities.  Skin: Warm and dry.   Intake/Output from previous day: 08/02 0701 - 08/03 0700 In: 560 [P.O.:560] Out: 15 [Drains:15]    Lab Results: BMET    Component Value Date/Time   NA 138 03/08/2022 0945   NA 136 01/02/2022 0444   NA 137 01/01/2022 0530   NA 143 04/08/2021 1026   K 4.3 03/08/2022 0945   K 4.1 01/02/2022 0444   K 4.1 01/01/2022 0530   CL 104 03/08/2022 0945   CL 103 01/02/2022 0444   CL 108 01/01/2022 0530   CO2 26 03/08/2022 0945   CO2 27 01/02/2022 0444   CO2 21 (L) 01/01/2022 0530   GLUCOSE 113 (H) 03/08/2022 0945   GLUCOSE 104 (H) 01/02/2022 0444   GLUCOSE 108 (H) 01/01/2022 0530   BUN 12 03/08/2022 0945   BUN 17 01/02/2022 0444   BUN 20 01/01/2022 0530   BUN 16 04/08/2021 1026   CREATININE 1.16 03/08/2022 0945   CREATININE 0.96 01/02/2022 0444   CREATININE 0.83 01/01/2022 0530   CALCIUM 9.2 03/08/2022 0945   CALCIUM 8.3 (L) 01/02/2022 0444    CALCIUM 8.2 (L) 01/01/2022 0530   GFRNONAA >60 03/08/2022 0945   GFRNONAA >60 01/02/2022 0444   GFRNONAA >60 01/01/2022 0530   GFRAA >60 01/18/2015 0500   GFRAA >60 01/10/2015 1005   GFRAA  11/29/2010 0620    >60        The eGFR has been calculated using the MDRD equation. This calculation has not been validated in all clinical situations. eGFR's persistently <60 mL/min signify possible Chronic Kidney Disease.   CBC    Component Value Date/Time   WBC 7.6 03/08/2022 0945   RBC 4.24 03/08/2022 0945   HGB 15.0 03/08/2022 0945   HGB 14.9 01/06/2022 1139   HCT 43.5 03/08/2022 0945   HCT 42.8 01/06/2022 1139   PLT 210 03/08/2022 0945   PLT 293 01/06/2022 1139   MCV 102.6 (H) 03/08/2022 0945   MCV 100 (H) 01/06/2022 1139   MCH 35.4 (H) 03/08/2022 0945   MCHC 34.5 03/08/2022 0945   RDW 13.6 03/08/2022 0945   RDW 13.1 01/06/2022 1139   LYMPHSABS 0.8 12/31/2021 1200   MONOABS 0.8 12/31/2021 1200   EOSABS 0.0 12/31/2021 1200   BASOSABS 0.0 12/31/2021 1200   HEPATIC Function Panel Recent Labs    12/31/21 1223 01/01/22 0530 01/02/22 0444  PROT 7.6  --   --   ALBUMIN 4.3 2.8* 2.8*  AST 20  --   --  ALT 14  --   --   ALKPHOS 57  --   --   BILIDIR 0.2  --   --   IBILI 0.4  --   --    HEMOGLOBIN A1C No results found for: "MPG" CARDIAC ENZYMES No results found for: "CKTOTAL", "CKMB", "CKMBINDEX", "TROPONINI" BNP No results for input(s): "PROBNP" in the last 8760 hours. TSH Recent Labs    12/31/21 1751  TSH 1.419   CHOLESTEROL No results for input(s): "CHOL" in the last 8760 hours.  Scheduled Meds:  amiodarone  200 mg Oral Daily   diltiazem  30 mg Oral Q12H   metoprolol tartrate  50 mg Oral BID   mupirocin ointment  1 Application Nasal BID   Continuous Infusions:  PRN Meds:.acetaminophen **OR** acetaminophen, menthol-cetylpyridinium **OR** phenol, methocarbamol **OR** [DISCONTINUED] methocarbamol (ROBAXIN) IV, ondansetron **OR** ondansetron (ZOFRAN) IV,  oxyCODONE, polyethylene glycol, sodium phosphate  Assessment/Plan:  Chronic atrial fibrillation Non-sustained VT S/P back surgery with drain Senile dementia  Plan: Discussed aspirin, heparin or low dose Eliquis. Will wait another 24 hours to resume anticoagulation. Recheck CBC and CMET   LOS: 3 days   Time spent including chart review, lab review, examination, discussion with patient/family : 30 min   Dixie Dials  MD  03/11/2022, 11:47 AM

## 2022-03-12 DIAGNOSIS — N182 Chronic kidney disease, stage 2 (mild): Secondary | ICD-10-CM | POA: Diagnosis not present

## 2022-03-12 DIAGNOSIS — R000 Tachycardia, unspecified: Secondary | ICD-10-CM | POA: Diagnosis not present

## 2022-03-12 DIAGNOSIS — M6281 Muscle weakness (generalized): Secondary | ICD-10-CM | POA: Diagnosis not present

## 2022-03-12 DIAGNOSIS — I4821 Permanent atrial fibrillation: Secondary | ICD-10-CM | POA: Diagnosis not present

## 2022-03-12 DIAGNOSIS — I482 Chronic atrial fibrillation, unspecified: Secondary | ICD-10-CM | POA: Diagnosis not present

## 2022-03-12 DIAGNOSIS — R2689 Other abnormalities of gait and mobility: Secondary | ICD-10-CM | POA: Diagnosis not present

## 2022-03-12 DIAGNOSIS — M4800 Spinal stenosis, site unspecified: Secondary | ICD-10-CM | POA: Diagnosis not present

## 2022-03-12 DIAGNOSIS — M5459 Other low back pain: Secondary | ICD-10-CM | POA: Diagnosis not present

## 2022-03-12 DIAGNOSIS — D696 Thrombocytopenia, unspecified: Secondary | ICD-10-CM | POA: Diagnosis not present

## 2022-03-12 DIAGNOSIS — M48061 Spinal stenosis, lumbar region without neurogenic claudication: Secondary | ICD-10-CM | POA: Diagnosis not present

## 2022-03-12 DIAGNOSIS — J309 Allergic rhinitis, unspecified: Secondary | ICD-10-CM | POA: Diagnosis not present

## 2022-03-12 DIAGNOSIS — F0390 Unspecified dementia without behavioral disturbance: Secondary | ICD-10-CM | POA: Diagnosis not present

## 2022-03-12 MED ORDER — OXYCODONE-ACETAMINOPHEN 5-325 MG PO TABS: 1.0000 | ORAL_TABLET | Freq: Four times a day (QID) | ORAL | 0 refills | Status: AC | PRN

## 2022-03-12 MED ORDER — AMIODARONE HCL 200 MG PO TABS: 200.0000 mg | ORAL_TABLET | Freq: Every day | ORAL | 0 refills | Status: DC

## 2022-03-12 MED ORDER — APIXABAN 2.5 MG PO TABS
2.5000 mg | ORAL_TABLET | Freq: Two times a day (BID) | ORAL | Status: DC
Start: 2022-03-12 — End: 2022-03-12
  Administered 2022-03-12: 2.5 mg via ORAL
  Filled 2022-03-12: qty 1

## 2022-03-12 MED ORDER — METHOCARBAMOL 500 MG PO TABS: 500.0000 mg | ORAL_TABLET | Freq: Three times a day (TID) | ORAL | 0 refills | Status: AC | PRN

## 2022-03-12 MED ORDER — APIXABAN 2.5 MG PO TABS
2.5000 mg | ORAL_TABLET | Freq: Two times a day (BID) | ORAL | 0 refills | Status: DC
Start: 2022-03-12 — End: 2022-03-29

## 2022-03-12 MED ORDER — DILTIAZEM HCL 30 MG PO TABS
30.0000 mg | ORAL_TABLET | Freq: Two times a day (BID) | ORAL | 0 refills | Status: DC
Start: 2022-03-12 — End: 2022-03-29

## 2022-03-12 NOTE — Progress Notes (Signed)
    Subjective: Procedure(s) (LRB): THORACIC TWELVE TO LUMBAR FIVE DECOMPRESSION (N/A) 4 Days Post-Op  Patient reports pain as 2 on 0-10 scale.  Reports decreased leg pain reports incisional back pain   Positive void Positive bowel movement Positive flatus Negative chest pain or shortness of breath  Objective: Vital signs in last 24 hours: Temp:  [97.8 F (36.6 C)-98.6 F (37 C)] 98.1 F (36.7 C) (08/04 0441) Pulse Rate:  [70-82] 82 (08/04 0441) Resp:  [13-20] 13 (08/04 0441) BP: (94-118)/(44-77) 117/70 (08/04 0441) SpO2:  [92 %-97 %] 97 % (08/04 0441)  Intake/Output from previous day: 08/03 0701 - 08/04 0700 In: 54 [P.O.:357] Out: 20 [Drains:20]  Labs: Recent Labs    03/11/22 1212  WBC 9.2  RBC 3.35*  HCT 34.7*  PLT 175   Recent Labs    03/11/22 1212  NA 138  K 4.2  CL 100  CO2 28  BUN 14  CREATININE 1.09  GLUCOSE 100*  CALCIUM 8.7*   No results for input(s): "LABPT", "INR" in the last 72 hours.  Physical Exam: Neurologically intact ABD soft Intact pulses distally Incision: dressing C/D/I Compartment soft Body mass index is 24.86 kg/m.   Assessment/Plan: Patient stable  xrays N/A Continue mobilization with physical therapy Continue care  [place] is doing quite well status post T12-L2 decompression and excision of hematoma.  The wound today is clean dry and intact there is no swelling or bleeding status post removal of the drain.  Patient's hematocrit is stable.  1.  From the spine standpoint patient can be discharged to SNF once bed is available and patient is cleared by cardiology. 2.  Agree with starting low-dose Eliquis today. 3.  Patient will follow-up with his treating cardiologist as recommended. 4.  Continue mobilization with brace. 5.  Patient will follow-up with me in 2 weeks for wound check and reevaluation.  Melina Schools, MD [facility] [phone]

## 2022-03-12 NOTE — Progress Notes (Signed)
Nurse attempted x3 to call report. No response. Voice message left.  Gwendolyn Grant, RN

## 2022-03-12 NOTE — Consult Note (Signed)
Ref: [name], MD   Subjective:  Awake. Atrial fibrillation with HR in 70's at rest and 80's with 100 feet walk. Discussed resuming Eliquis at 2.5 mg. Bid Patient going for rehab for 1-2 weeks. Drain is removed last night.  Objective:  Vital Signs in the last 24 hours: Temp:  [97.8 F (36.6 C)-98.3 F (36.8 C)] 97.8 F (36.6 C) (08/04 0747) Pulse Rate:  [70-88] 88 (08/04 0747) Cardiac Rhythm: Atrial fibrillation (08/04 0700) Resp:  [13-20] 13 (08/04 0441) BP: (94-117)/(44-77) 109/67 (08/04 0747) SpO2:  [92 %-97 %] 93 % (08/04 0747)  Physical Exam: BP Readings from Last 1 Encounters:  03/12/22 109/67     Wt Readings from Last 1 Encounters:  03/08/22 69.9 kg    Weight change:  Body mass index is 24.86 kg/m. HEENT: [place]/AT, Eyes-Blue, Conjunctiva-Pink, Sclera-Non-icteric Neck: No JVD, No bruit, Trachea midline. Lungs:  Clear, Bilateral. Cardiac:  Regular rhythm, normal S1 and S2, no S3. II/VI systolic murmur. Abdomen:  Soft, non-tender. BS present. Extremities:  No edema present. No cyanosis. No clubbing. CNS: AxOx3, Cranial nerves grossly intact, moves all 4 extremities.  Skin: Warm and dry.   Intake/Output from previous day: 08/03 0701 - 08/04 0700 In: 357 [P.O.:357] Out: 20 [Drains:20]    Lab Results: BMET    Component Value Date/Time   NA 138 03/11/2022 1212   NA 138 03/08/2022 0945   NA 136 01/02/2022 0444   NA 143 04/08/2021 1026   K 4.2 03/11/2022 1212   K 4.3 03/08/2022 0945   K 4.1 01/02/2022 0444   CL 100 03/11/2022 1212   CL 104 03/08/2022 0945   CL 103 01/02/2022 0444   CO2 28 03/11/2022 1212   CO2 26 03/08/2022 0945   CO2 27 01/02/2022 0444   GLUCOSE 100 (H) 03/11/2022 1212   GLUCOSE 113 (H) 03/08/2022 0945   GLUCOSE 104 (H) 01/02/2022 0444   BUN 14 03/11/2022 1212   BUN 12 03/08/2022 0945   BUN 17 01/02/2022 0444   BUN 16 04/08/2021 1026   CREATININE 1.09 03/11/2022 1212   CREATININE 1.16 03/08/2022 0945   CREATININE 0.96  01/02/2022 0444   CALCIUM 8.7 (L) 03/11/2022 1212   CALCIUM 9.2 03/08/2022 0945   CALCIUM 8.3 (L) 01/02/2022 0444   GFRNONAA >60 03/11/2022 1212   GFRNONAA >60 03/08/2022 0945   GFRNONAA >60 01/02/2022 0444   GFRAA >60 01/18/2015 0500   GFRAA >60 01/10/2015 1005   GFRAA  11/29/2010 0620    >60        The eGFR has been calculated using the MDRD equation. This calculation has not been validated in all clinical situations. eGFR's persistently <60 mL/min signify possible Chronic Kidney Disease.   CBC    Component Value Date/Time   WBC 9.2 03/11/2022 1212   RBC 3.35 (L) 03/11/2022 1212   HGB 11.9 (L) 03/11/2022 1212   HGB 14.9 01/06/2022 1139   HCT 34.7 (L) 03/11/2022 1212   HCT 42.8 01/06/2022 1139   PLT 175 03/11/2022 1212   PLT 293 01/06/2022 1139   MCV 103.6 (H) 03/11/2022 1212   MCV 100 (H) 01/06/2022 1139   MCH 35.5 (H) 03/11/2022 1212   MCHC 34.3 03/11/2022 1212   RDW 13.7 03/11/2022 1212   RDW 13.1 01/06/2022 1139   LYMPHSABS 0.8 12/31/2021 1200   MONOABS 0.8 12/31/2021 1200   EOSABS 0.0 12/31/2021 1200   BASOSABS 0.0 12/31/2021 1200   HEPATIC Function Panel Recent Labs    12/31/21 1223 01/01/22 0530  01/02/22 0444 03/11/22 1212  PROT 7.6  --   --  5.4*  ALBUMIN 4.3 2.8* 2.8* 3.1*  AST 20  --   --  20  ALT 14  --   --  12  ALKPHOS 57  --   --  36*  BILIDIR 0.2  --   --   --   IBILI 0.4  --   --   --    HEMOGLOBIN A1C No results found for: "MPG" CARDIAC ENZYMES No results found for: "CKTOTAL", "CKMB", "CKMBINDEX", "TROPONINI" BNP No results for input(s): "PROBNP" in the last 8760 hours. TSH Recent Labs    12/31/21 1751  TSH 1.419   CHOLESTEROL No results for input(s): "CHOL" in the last 8760 hours.  Scheduled Meds:  amiodarone  200 mg Oral Daily   apixaban  2.5 mg Oral BID   diltiazem  30 mg Oral Q12H   metoprolol tartrate  50 mg Oral BID   mupirocin ointment  1 Application Nasal BID   Continuous Infusions:  PRN Meds:.acetaminophen  **OR** acetaminophen, menthol-cetylpyridinium **OR** phenol, methocarbamol **OR** [DISCONTINUED] methocarbamol (ROBAXIN) IV, ondansetron **OR** ondansetron (ZOFRAN) IV, oxyCODONE, polyethylene glycol, sodium phosphate  Assessment/Plan:  Chronic atrial fibrillation Non-sustained VT S/P back surgery Senile dementia  Plan: Eliquis 2.5 mg. Started. Ambulated patient 100 feet.with controlled HR. Discussed external cardioversion post 1 month. Patient to discuss with Dr. Percival Spanish about the procedure or continue medical treatment or referral to EP doctor. May decrease amiodarone to 100 mg. Daily post 1 month.   LOS: 4 days   Time spent including chart review, lab review, examination, discussion with patient/Family/Nurse : 30 min   Dixie Dials  MD  03/12/2022, 11:44 AM

## 2022-03-12 NOTE — Discharge Summary (Addendum)
Patient ID: Joseph [name] MRN: [id number] DOB/AGE: 08-24-1935 86 y.o.  Admit date: 03/08/2022 Discharge date: 03/12/2022  Admission Diagnoses:  Principal Problem:   Lumbar spinal stenosis   Discharge Diagnoses:  Principal Problem:   Lumbar spinal stenosis  status post Procedure(s): THORACIC TWELVE TO LUMBAR FIVE DECOMPRESSION  Past Medical History:  Diagnosis Date   Arthritis    Atrial fibrillation ([facility])    resolved with cardioversion   Complication of anesthesia    Dysrhythmia    Atrial Fibrilation   GERD (gastroesophageal reflux disease)    Headache    occasional   History of skin cancer    Pneumonia    PONV (postoperative nausea and vomiting)    Prostate cancer ([facility])    Sigmoid diverticulitis April of 2012   with small perforation   Squamous cell carcinoma     Surgeries: Procedure(s): THORACIC TWELVE TO LUMBAR FIVE DECOMPRESSION on 03/08/2022   Consultants:   Discharged Condition: Improved  Hospital Course: Joseph [name] is an 86 y.o. male who was admitted 03/08/2022 for operative treatment of Lumbar spinal stenosis. Patient failed conservative treatments (please see the history and physical for the specifics) and had severe unremitting pain that affects sleep, daily activities and work/hobbies. After pre-op clearance, the patient was taken to the operating room on 03/08/2022 and underwent  Procedure(s): THORACIC TWELVE TO LUMBAR FIVE DECOMPRESSION.    Patient was given perioperative antibiotics:  Anti-infectives (From admission, onward)    Start     Dose/Rate Route Frequency Ordered Stop   03/08/22 2000  ceFAZolin (ANCEF) IVPB 1 g/50 mL premix        1 g 100 mL/hr over 30 Minutes Intravenous Every 8 hours 03/08/22 1824 03/09/22 0354   03/08/22 1100  ceFAZolin (ANCEF) IVPB 2g/100 mL premix        2 g 200 mL/hr over 30 Minutes Intravenous 30 min pre-op 03/08/22 0847 03/08/22 1140        Patient was given sequential compression devices and early ambulation to  prevent DVT.   Patient benefited maximally from hospital stay and there were no complications. At the time of discharge, the patient was urinating/moving their bowels without difficulty, tolerating a regular diet, pain is controlled with oral pain medications and they have been cleared by PT/OT.   Recent vital signs: Patient Vitals for the past 24 hrs:  BP Temp Temp src Pulse Resp SpO2  03/12/22 1100 110/78 98 F (36.7 C) Oral 87 16 98 %  03/12/22 0747 109/67 97.8 F (36.6 C) Oral 88 -- 93 %  03/12/22 0441 117/70 98.1 F (36.7 C) Oral 82 13 97 %  03/11/22 2102 (!) 94/44 -- -- 71 -- --  03/11/22 2041 (!) 99/50 97.9 F (36.6 C) Oral 77 18 96 %  03/11/22 1654 116/61 97.8 F (36.6 C) Oral 70 20 96 %     Recent laboratory studies:  Recent Labs    03/11/22 1212  WBC 9.2  HGB 11.9*  HCT 34.7*  PLT 175  NA 138  K 4.2  CL 100  CO2 28  BUN 14  CREATININE 1.09  GLUCOSE 100*  CALCIUM 8.7*     Discharge Medications:   Allergies as of 03/12/2022       Reactions   Poison Ivy Extract Rash   Tetracycline Hcl Rash        Medication List     STOP taking these medications    acetaminophen 650 MG CR tablet Commonly known as: TYLENOL  diclofenac Sodium 1 % Gel Commonly known as: VOLTAREN   lidocaine 5 % Commonly known as: Lidoderm   OVER THE COUNTER MEDICATION   oxyCODONE 5 MG immediate release tablet Commonly known as: Roxicodone   SALONPAS PAIN RELIEF PATCH EX   TURMERIC CURCUMIN PO       TAKE these medications    amiodarone 200 MG tablet Commonly known as: PACERONE Take 1 tablet (200 mg total) by mouth daily. Start taking on: March 13, 2022   apixaban 2.5 MG Tabs tablet Commonly known as: ELIQUIS Take 1 tablet (2.5 mg total) by mouth 2 (two) times daily. What changed:  medication strength how much to take   diltiazem 30 MG tablet Commonly known as: CARDIZEM Take 1 tablet (30 mg total) by mouth every 12 (twelve) hours.   methocarbamol 500 MG  tablet Commonly known as: ROBAXIN Take 1 tablet (500 mg total) by mouth every 8 (eight) hours as needed for up to 5 days for muscle spasms.   metoprolol tartrate 50 MG tablet Commonly known as: LOPRESSOR Take 1 tablet (50 mg total) by mouth 2 (two) times daily.   oxyCODONE-acetaminophen 5-325 MG tablet Commonly known as: Percocet Take 1 tablet by mouth every 6 (six) hours as needed for up to 5 days for severe pain.        Diagnostic Studies: DG Lumbar Spine 1 View  Result Date: 03/08/2022 CLINICAL DATA:  Surgical localization. EXAM: LUMBAR SPINE - 1 VIEW COMPARISON:  Radiograph of same day.  MRI of March 02, 2022. FINDINGS: Single intraoperative cross-table lateral projection was obtained of the lumbar spine. These images demonstrate surgical probes at approximately the L1 and L2-3 levels. IMPRESSION: Surgical localization as described above. Electronically Signed   By: Marijo Conception M.D.   On: 03/08/2022 13:35   DG Lumbar Spine 2-3 Views  Result Date: 03/08/2022 CLINICAL DATA:  T12-L2 decompression. EXAM: LUMBAR SPINE - 2-3 VIEW COMPARISON:  MRI 03/02/2022 FINDINGS: Initial film shows needles directed at the pedicle level of T11 and L1. Second film shows tissue spreaders posterior with a probe directed at the pedicle level of L3. Findings discussed by telephone with Dr. Rolena Infante at 1230 hours. IMPRESSION: Pedicle level L3 localized. Electronically Signed   By: Nelson Chimes M.D.   On: 03/08/2022 12:32   MR Lumbar Spine W Wo Contrast  Result Date: 03/02/2022 CLINICAL DATA:  Initial evaluation for low back pain, left lower extremity numbness, weakness. EXAM: MRI LUMBAR SPINE WITHOUT AND WITH CONTRAST TECHNIQUE: Multiplanar and multiecho pulse sequences of the lumbar spine were obtained without and with intravenous contrast. CONTRAST:  80m GADAVIST GADOBUTROL 1 MMOL/ML IV SOLN COMPARISON:  MRI from 03/22/2021. FINDINGS: Segmentation: Standard. Lowest well-formed disc space labeled the L5-S1  level. Alignment: 2 mm retrolisthesis of L3 on L4, with 3 mm anterolisthesis of L5 on S1. Alignment otherwise normal with preservation of the normal lumbar lordosis. Vertebrae: Vertebral body height maintained without acute or chronic fracture. Bone marrow signal intensity mildly heterogeneous but overall within normal limits. No discrete or worrisome osseous lesions. No evidence for osteomyelitis discitis or septic arthritis within the lumbar spine. Conus medullaris and cauda equina: Conus extends to the T12-L1 level. Conus medullaris within normal limits. There is a heterogeneous lobulated epidural to intradural collection involving the central and left dorsal thecal sac, extending from T12 through L2-3 (series 11, image 8). This measures up to 1.5 x 1.1 x 8.0 cm in maximal dimensions (transverse by AP by craniocaudad). Finding is indeterminate, but could  reflect an evolving subacute hematoma. Possible infection with abscess formation could also be considered, although there is no other evidence for infection elsewhere within the lumbar spine. An atypical synovial cyst is also considered, although felt to be unlikely given the overall morphology of this finding. Paraspinal and other soft tissues: Postoperative changes present within the posterior paraspinous soft tissues. Paraspinous soft tissues demonstrate no acute finding. Few scattered T2 hyperintense cyst noted about the visualized kidneys, benign in appearance, no follow-up imaging recommended. Disc levels: T12-L1: Negative interspace. Mild left greater than right facet hypertrophy. Superior component of the intradural to epidural collection at the dorsal thecal sac. No more than mild spinal stenosis. Foramina remain patent. L1-2: Disc desiccation with mild disc bulge. Moderate facet hypertrophy. Intradural to epidural collection centered at the left dorsal aspect of the thecal sac. Resultant severe spinal stenosis, with severe narrowing of the left lateral  recess. Foramina remain patent. L2-3: Disc desiccation with mild disc bulge. Moderate facet and ligament flavum hypertrophy. Inferior aspect of the above described epidural to intradural collection centered at the left posterior thecal sac. Mild canal with moderate left lateral recess stenosis at this level. Foramina remain patent. L3-4: Trace retrolisthesis with degenerative intervertebral disc space narrowing. Diffuse disc bulge with mild reactive endplate spurring. Moderate facet hypertrophy. Mild bilateral subarticular stenosis. Central canal remains patent. Mild bilateral L3 foraminal narrowing. L4-5: Degenerative intervertebral disc space narrowing with diffuse disc bulge and disc desiccation. Associated reactive endplate spurring. Superimposed central and right foraminal disc protrusions. Moderate facet hypertrophy with associated small joint effusions. Prior posterior decompression. No residual spinal stenosis. Mild left with severe right L4 foraminal stenosis. L5-S1: Disc desiccation with mild disc bulge. Severe left greater than right facet arthrosis. No significant spinal stenosis. Foramina remain patent. IMPRESSION: 1. 1.5 x 1.1 x 8.0 cm heterogeneous epidural to intradural collection involving the central and left dorsal thecal sac extending from T12 through L2-3, indeterminate, but could reflect an evolving subacute hematoma. Possible infection with abscess formation could also be considered in the correct clinical setting, although there is no other evidence for infection elsewhere within the lumbar spine. Correlation with symptomatology and laboratory values recommended. Resultant up to severe canal with left lateral recess stenosis at the level of L1-2. Finding is likely the source of the patient's left lower extremity symptoms. 2. Underlying multilevel degenerative spondylosis as above. No other high-grade spinal stenosis. Severe right L4 foraminal narrowing. Electronically Signed   By: Jeannine Boga M.D.   On: 03/02/2022 23:31    Discharge Instructions     Incentive spirometry RT   Complete by: As directed         Follow-up Information     [name], MD Follow up in 1 month(s).   Specialty: Internal Medicine Contact information: [address] [place] [address] [phone]         [name], MD Follow up in 2 week(s).   Specialty: Cardiology Contact information: [address] [place] [address] [phone]         Melina Schools, MD Follow up in 2 week(s).   Specialty: Orthopedic Surgery Why: If symptoms worsen, For suture removal, For wound re-check Contact information: [address] [place] [address] [phone]                 Discharge Plan:  discharge to SNF  Disposition: Overall patient doing well.  Pain improved and controlled with medications.  Patient followed by cardiology  due to arrhthymia and is currently stable.  Anticoagulation restarted with signs of bleeding.  Plan on discharge to SNF for ongoing mobilization  Patient will follow up with me in 2 weeks Continue to encourage ambulation with brace.  No bending/stooping/squatting. Patient will follow up with his cardiologist within the next month Patient given a script for tramadol for pain control prior to surgery.  Ok to use this instead of oxycodone.     Cardiac medications adjusted by cardiology recommendations      Signed: Dahlia Bailiff for Dr. Melina Schools [facility] [phone] 03/12/2022, 2:40 PM

## 2022-03-12 NOTE — Discharge Instructions (Signed)
Laminectomy, Care After This sheet gives you information about how to care for yourself after your procedure. Your health care provider may also give you more specific instructions. If you have problems or questions, contact your health care provider. What can I expect after the procedure? After the procedure, it is common to have: Some pain around your incision area. Muscle tightening (spasms) across the back.   Follow these instructions at home: Incision care Follow instructions from your health care provider about how to take care of your incision area. Make sure you: Wash your hands with soap and water before and after you apply medicine to the area or change your bandage (dressing). If soap and water are not available, use hand sanitizer. Change your dressing as told by your health care provider. Leave stitches (sutures), skin glue, or adhesive strips in place. These skin closures may need to stay in place for 2 weeks or longer. If adhesive strip edges start to loosen and curl up, you may trim the loose edges. Do not remove adhesive strips completely unless your health care provider tells you to do that.  Check your incision area every day for signs of infection. Check for: More redness, swelling, or pain. More fluid or blood. Warmth. Pus or a bad smell. Medicines Take over-the-counter and prescription medicines only as told by your health care provider. If you were prescribed an antibiotic medicine, use it as told by your health care provider. Do not stop using the antibiotic even if you start to feel better. If needed, call office in 3 days to request refill of pain medications.  CALL MD IF YOU NOTICE BLEEDING/SWELLING OR WEAKNESS  Bathing Do not take baths, swim, or use a hot tub for 6 weeks, or until your incision has healed completely. If your health care provider approves, you may take showers after your dressing has been removed. Ok to shower in 5 days Activity Return to  your normal activities as told by your health care provider. Ask your health care provider what activities are safe for you. Avoid bending or twisting at your waist. Always bend at your knees. Do not sit for more than 20-30 minutes at a time. Lie down or walk between periods of sitting. Do not lift anything that is heavier than 10 lb (4.5 kg) or the limit that your health care provider tells you, until he or she says that it is safe. Do not drive for 2 weeks after your procedure or for as long as your health care provider tells you.  Do not drive or use heavy machinery while taking prescription pain medicine. General instructions To prevent or treat constipation while you are taking prescription pain medicine, your health care provider may recommend that you: Drink enough fluid to keep your urine clear or pale yellow. Take over-the-counter or prescription medicines. Eat foods that are high in fiber, such as fresh fruits and vegetables, whole grains, and beans. Limit foods that are high in fat and processed sugars, such as fried and sweet foods. Do breathing exercises as told. Keep all follow-up visits as told by your health care provider. This is important. Contact a health care provider if: You have more redness, swelling, or pain around your incision area. Your incision feels warm to the touch. You are not able to return to activities or do exercises as told by your health care provider. Get help right away if: You have: More fluid or blood coming from your incision area. Pus or a bad  smell coming from your incision area. Chills or a fever. Episodes of dizziness or fainting while standing. You develop a rash. You develop shortness of breath or you have difficulty breathing. You cannot control when you urinate or have a bowel movement. You become weak. You are not able to use your legs. Summary After the procedure, it is common to have some pain around your incision area. You may also  have muscle tightening (spasms) across the back. Follow instructions from your health care provider about how to care for your incision. Do not lift anything that is heavier than 10 lb (4.5 kg) or the limit that your health care provider tells you, until he or she says that it is safe. Contact your health care provider if you have more redness, swelling, or pain around your incision area or if your incision feels warm to the touch. These can be signs of infection. This information is not intended to replace advice given to you by your health care provider. Make sure you discuss any questions you have with your health care provider. Refer to this sheet in the next few weeks. These instructions provide you with information about caring for yourself after your procedure. Your health care provider may also give you more specific instructions. Your treatment has been planned according to current medical practices, but problems sometimes occur. Call your health care provider if you have any problems or questions after your procedure. What can I expect after the procedure? It is common to have pain for the first few days after the procedure. Some people continue to have mild pain even after making a full recovery. Follow these instructions at home: Medicine Take medicines only as directed by your health care provider. Avoid taking over-the-counter pain medicines unless your health care provider tells you otherwise. These medicines interfere with the development and growth of new bone cells. If you were prescribed a narcotic pain medicine, take it exactly as told by your health care provider. Do not drink alcohol while on the medicine. Do not drive while on the medicine. Injury care Care for your back brace as told by your health care provider. If directed, apply ice to the injured area: Put ice in a plastic bag. Place a towel between your skin and the bag. Leave the ice on for 20 minutes, 2-3 times a  day. Activity Perform physical therapy exercises as told by your health care provider. Exercise regularly. Start by taking short walks. Slowly increase your activity level over time. Gentle exercise helps to ease pain. Sit, stand, walk, turn in bed, and reposition yourself as told by your health care provider. This will help to keep your spine in proper alignment. Avoid bending and twisting your body. Avoid doing strenuous household chores, such as vacuuming. Do not lift anything that is heavier than 10 lb (4.5 kg). Other Instructions Keep all follow-up visits as directed by your health care provider. This is important. Do not use any tobacco products, including cigarettes, chewing tobacco, or electronic cigarettes. If you need help quitting, ask your health care provider. Nicotine affects the way bones heal. Contact a health care provider if: Your pain gets worse. You have a fever. You have redness, swelling, or pain at the site of your incision. You have fluid, blood, or pus coming from your incision. You have numbness, tingling, or weakness in any part of your body. Get help right away if: Your incision feels swollen and tender, and the surrounding area looks like a lump.  The lump may be red or bluish in color. You cannot move any part of your body (paralysis). You cannot control your bladder or bowels.

## 2022-03-12 NOTE — TOC Transition Note (Signed)
Transition of Care [facility]) - CM/SW Discharge Note   Patient Details  Name: Joseph [name] MRN: [id number] Date of Birth: [birth year]  Transition of Care [facility]) CM/SW Contact:  Pollie Friar, RN Phone Number: 03/12/2022, 2:33 PM   Clinical Narrative:    Pt is discharging to [facility] today. Family to provide transport. Bedside Rn updated.   Number for report: [phone]   Final next level of care: Skilled Nursing Facility Barriers to Discharge: No Barriers Identified   Patient Goals and CMS Choice   CMS Medicare.gov Compare Post Acute Care list provided to:: Patient Choice offered to / list presented to : Patient, Spouse  Discharge Placement PASRR number recieved: 03/10/22            Patient chooses bed at: [facility] Patient to be transferred to facility by: family Name of family member notified: son and wife at bedside Patient and family notified of of transfer: 03/12/22  Discharge Plan and Services   Discharge Planning Services: CM Consult                                 Social Determinants of Health ([place]) Interventions     Readmission Risk Interventions     No data to display

## 2022-03-12 NOTE — Plan of Care (Signed)

## 2022-03-12 NOTE — Progress Notes (Signed)
Physical Therapy Treatment Patient Details Name: Joseph [name] MRN: [id number] DOB: [birth year] Today's Date: 03/12/2022   History of Present Illness 86 yo. male admitted 03/08/22, due to  Epidural hematoma with severe spinal stenosis T12-L2. Underwent T12-L5 decompression. Complicated by non-sustained VT and afib. PMH of Arthritis, atrial fibrillation, GERD, prostate cancer and s/p squamous cell carcinoma    PT Comments    Tolerated treatment well, reviewed bed mobility techniques while maintaining back precautions. Improved tolerance with gait, minor cues for posture and walker control. Per pt request, navigated 2 steps several times (has 2 steps at home that is more conveniently located for enter/exiting home, but does have a less convenient option that requires further distance walk to get in/out of home.) Pt required min assist, difficulty recalling sequencing and safety on descent, but stable on ascent with good LE strength using RW. Encouraged working on this task at [employer] to Higher education careers adviser. Patient will continue to benefit from skilled physical therapy services to further improve independence with functional mobility.    Recommendations for follow up therapy are one component of a multi-disciplinary discharge planning process, led by the attending physician.  Recommendations may be updated based on patient status, additional functional criteria and insurance authorization.  Follow Up Recommendations  Skilled nursing-short term rehab (<3 hours/day) Can patient physically be transported by private vehicle: Yes   Assistance Recommended at Discharge Intermittent Supervision/Assistance  Patient can return home with the following A little help with walking and/or transfers;A little help with bathing/dressing/bathroom;Assist for transportation   Equipment Recommendations  None recommended by PT    Recommendations for Other Services       Precautions / Restrictions Precautions Precautions:  Fall;Back Precaution Booklet Issued:  (Provided at earlier date) Precaution Comments: cues for precautions Required Braces or Orthoses: Spinal Brace Spinal Brace: Lumbar corset;Applied in sitting position Restrictions Weight Bearing Restrictions: No     Mobility  Bed Mobility Overal bed mobility: Needs Assistance Bed Mobility: Rolling, Sit to Sidelying Rolling: Supervision       Sit to sidelying: Min guard General bed mobility comments: Min guard for safety to maintain back precautions. Still painful for patient but demonstrates adquate use of rails for support. Reminded to turn shoulders and hips simultaneously with roll.    Transfers Overall transfer level: Needs assistance Equipment used: Rolling walker (2 wheels) Transfers: Sit to/from Stand Sit to Stand: Min guard           General transfer comment: Min guard. Pushing up fron arm rests on chair, maintains precautions. Slower and guarded but without evidence of instability once holding on RW upon standing.    Ambulation/Gait Ambulation/Gait assistance: Min guard Gait Distance (Feet): 200 Feet Assistive device: Rolling walker (2 wheels) Gait Pattern/deviations: Step-through pattern, Decreased stride length, Shuffle, Trunk flexed, Drifts right/left Gait velocity: slow Gait velocity interpretation: <1.8 ft/sec, indicate of risk for recurrent falls   General Gait Details: Still with minor shuffle and occasional drift of RW outside of BOS. Cues for posture and RW control close to proximity. No buckling, larger steps today. improved gait speed noted.   Stairs Stairs: Yes Stairs assistance: Min assist Stair Management: No rails, Forwards, With walker Number of Stairs: 2 (x4) General stair comments: Practiced stair navigation. Decreased recall of sequencing coming down but safely navigated up. Min assist for walker control and VC for placement on descent. Good LE strength without buckling, RW block for  safety.   Wheelchair Mobility    Modified Rankin (Stroke Patients Only)  Balance Overall balance assessment: Needs assistance Sitting-balance support: Feet supported Sitting balance-Leahy Scale: Fair Sitting balance - Comments: Sits unsupported   Standing balance support: No upper extremity supported Standing balance-Leahy Scale: Fair Standing balance comment: Standing without UE support although prefers for comfort.                            Cognition Arousal/Alertness: Awake/alert Behavior During Therapy: WFL for tasks assessed/performed Overall Cognitive Status: Impaired/Different from baseline Area of Impairment: Memory, Safety/judgement, Problem solving                     Memory: Decreased recall of precautions, Decreased short-term memory Following Commands: Follows one step commands with increased time, Follows one step commands consistently     Problem Solving: Slow processing, Requires verbal cues General Comments: Pt recalling precautions with increased time. Min verbal cues for RW management. Slightly increased time to follow commands. Difficulty recalling sequencing and safety on stairs.        Exercises      General Comments General comments (skin integrity, edema, etc.): HR in 80s throughout therapy session including gait training.      Pertinent Vitals/Pain Pain Assessment Pain Assessment: Faces Faces Pain Scale: Hurts even more (Increased throughout session) Pain Location: back Pain Descriptors / Indicators: Aching Pain Intervention(s): Monitored during session, Repositioned, Limited activity within patient's tolerance    Home Living                          Prior Function            PT Goals (current goals can now be found in the care plan section) Acute Rehab PT Goals PT Goal Formulation: With patient Time For Goal Achievement: 03/16/22 Potential to Achieve Goals: Good Progress towards PT goals:  Progressing toward goals    Frequency    Min 4X/week      PT Plan Current plan remains appropriate    Co-evaluation              AM-PAC PT "6 Clicks" Mobility   Outcome Measure  Help needed turning from your back to your side while in a flat bed without using bedrails?: A Little Help needed moving from lying on your back to sitting on the side of a flat bed without using bedrails?: A Little Help needed moving to and from a bed to a chair (including a wheelchair)?: A Little Help needed standing up from a chair using your arms (e.g., wheelchair or bedside chair)?: A Little Help needed to walk in hospital room?: A Little Help needed climbing 3-5 steps with a railing? : A Little 6 Click Score: 18    End of Session Equipment Utilized During Treatment: Gait belt;Back brace Activity Tolerance: Patient tolerated treatment well Patient left: with family/visitor present;with SCD's reapplied;in bed   PT Visit Diagnosis: Other abnormalities of gait and mobility (R26.89);Pain;Muscle weakness (generalized) (M62.81) Pain - part of body:  (back)     Time: 1448-1856 PT Time Calculation (min) (ACUTE ONLY): 23 min  Charges:  $Gait Training: 8-22 mins $Therapeutic Activity: 8-22 mins                     Candie Mile, PT    Ellouise Newer 03/12/2022, 12:44 PM

## 2022-03-22 DIAGNOSIS — S064XAA Epidural hemorrhage with loss of consciousness status unknown, initial encounter: Secondary | ICD-10-CM | POA: Diagnosis not present

## 2022-03-22 DIAGNOSIS — I480 Paroxysmal atrial fibrillation: Secondary | ICD-10-CM | POA: Diagnosis not present

## 2022-03-25 NOTE — Progress Notes (Unsigned)
Cardiology Office Note   Date:  03/29/2022   ID:  Joseph [name], DOB 1935/11/13, MRN [id number]  PCP:  [name], MD  Cardiologist:   [name], MD   No chief complaint on file.    History of Present Illness: Joseph [name] is a 86 y.o. male who presents for follow up of atrial fib.  He had cardioversion in 2012 when he developed atrial fibrillation at the time of ruptured diverticulum. However, he had another recurrence of this in 2017.    He has been in chronic atrial fib.   He had back surgery since I last saw him.  This was just last week.  He is getting around slowly because of this.  Since I last saw him he was in the ED with back and leg pain and was found to have a spontaneous epidural hematoma related to blood thinner.  Eliquis was held.  I reviewed these records for this visit.   He was ultimately restarted on 2.5 mg twice daily.  He returns for follow-up.  He has had no new palpitations, presyncope or syncope.  He has had no new chest pressure, neck or arm discomfort.  I do see that he was seen by another cardiology group when he was in the hospital and I reviewed those records.  He was put on a low-dose beta-blocker.  Also he was started on amiodarone suspect for paroxysmal atrial fibrillation.  He was mostly sent home from this but not taking it at home.  He has not been having any palpitations, presyncope or syncope.  He has some low back pain.  Is not been having any chest pressure, neck or arm discomfort.  He is having no new shortness of breath, PND or orthopnea.   Past Medical History:  Diagnosis Date   Arthritis    Atrial fibrillation ([place])    resolved with cardioversion   Complication of anesthesia    Dysrhythmia    Atrial Fibrilation   GERD (gastroesophageal reflux disease)    Headache    occasional   History of skin cancer    Pneumonia    PONV (postoperative nausea and vomiting)    Prostate cancer ([place])    Sigmoid diverticulitis April of 2012   with  small perforation   Squamous cell carcinoma     Past Surgical History:  Procedure Laterality Date   CARDIOVERSION  01/07/2009   LASIK     LUMBAR LAMINECTOMY/DECOMPRESSION MICRODISCECTOMY N/A 05/06/2021   Procedure: Lumbar decompression and insitu fusion Lumbar four - Lumbar five;  Surgeon: Melina Schools, MD;  Location: [place];  Service: Orthopedics;  Laterality: N/A;  3 hrs 3 C-Bed   LUMBAR LAMINECTOMY/DECOMPRESSION MICRODISCECTOMY N/A 03/08/2022   Procedure: THORACIC TWELVE TO LUMBAR FIVE DECOMPRESSION;  Surgeon: Melina Schools, MD;  Location: [place];  Service: Orthopedics;  Laterality: N/A;  2hr 3 C-Bed   LYMPHADENECTOMY Bilateral 01/17/2015   Procedure: PELVIC LYMPHADENECTOMY;  Surgeon: Alexis Frock, MD;  Location: [facility];  Service: Urology;  Laterality: Bilateral;   NASAL SEPTUM SURGERY     ROBOT ASSISTED LAPAROSCOPIC RADICAL PROSTATECTOMY N/A 01/17/2015   Procedure: ROBOTIC ASSISTED LAPAROSCOPIC RADICAL PROSTATECTOMY WITH INDOCYANINE GREEN DYE INJECTION;  Surgeon: Alexis Frock, MD;  Location: [facility];  Service: Urology;  Laterality: N/A;   SHOULDER SURGERY     x 2   TONSILLECTOMY     as a child     Current Outpatient Medications  Medication Sig Dispense Refill   amiodarone (PACERONE) 200  MG tablet Take 1 tablet (200 mg total) by mouth daily. 30 tablet 0   apixaban (ELIQUIS) 2.5 MG TABS tablet Take 1 tablet (2.5 mg total) by mouth 2 (two) times daily. 60 tablet 0   diltiazem (CARDIZEM) 30 MG tablet Take 1 tablet (30 mg total) by mouth every 12 (twelve) hours. 60 tablet 0   metoprolol tartrate (LOPRESSOR) 50 MG tablet Take 1 tablet (50 mg total) by mouth 2 (two) times daily. 180 tablet 1   No current facility-administered medications for this visit.    Allergies:   Poison ivy extract and Tetracycline hcl    ROS:  Please see the history of present illness.   Otherwise, review of systems are positive for none.   All other systems are reviewed and negative.    PHYSICAL  EXAM: VS:  BP 130/72   Pulse 74   Ht '5\' 6"'$  (1.676 m)   Wt 158 lb 12.8 oz (72 kg)   SpO2 97%   BMI 25.63 kg/m  , BMI Body mass index is 25.63 kg/m.  GENERAL:  Well appearing NECK:  No jugular venous distention, waveform within normal limits, carotid upstroke brisk and symmetric, no bruits, no thyromegaly LUNGS:  Clear to auscultation bilaterally CHEST:  Unremarkable HEART:  PMI not displaced or sustained,S1 and S2 within normal limits, no S3, no clicks, no rubs, no murmur irregular  ABD:  Flat, positive bowel sounds normal in frequency in pitch, no bruits, no rebound, no guarding, no midline pulsatile mass, no hepatomegaly, no splenomegaly EXT:  2 plus pulses throughout, no edema, no cyanosis no clubbing  EKG:  EKG is  ordered today. Atrial fibrillation, rate 62, axis within normal limits, intervals within normal limits, no acute ST-T wave changes  Recent Labs: 12/31/2021: TSH 1.419 01/02/2022: Magnesium 2.0 03/11/2022: ALT 12; BUN 14; Creatinine, Ser 1.09; Hemoglobin 11.9; Platelets 175; Potassium 4.2; Sodium 138    Lipid Panel No results found for: "CHOL", "TRIG", "HDL", "CHOLHDL", "VLDL", "LDLCALC", "LDLDIRECT"    Wt Readings from Last 3 Encounters:  03/29/22 158 lb 12.8 oz (72 kg)  03/08/22 154 lb (69.9 kg)  01/06/22 160 lb (72.6 kg)      Other studies Reviewed: Additional studies/ records that were reviewed today include: Extensive review of hospital records including another practices cardiology notes Review of the above records demonstrates: See elsewhere  ASSESSMENT AND PLAN:   ATRIAL FIB:     He tolerates anticoagulation. . [name] has a CHA2DS2 - VASc score of 2 .  He seems to be in sinus rhythm.  I do not think he is taking the amiodarone currently that was there at discharge well not continue this.  I do not think that he needs the beta-blocker.  He can remain on his calcium channel blocker.  I had a long discussion with the patient and his wife.  They  prefer to be on the 2.5 mg of Eliquis.  I explained the dosing per package insert at which she would still qualify for 5 mg twice daily.  However, they are very leery of further bleeding and understand that there probably is some benefit to the lower dose so he technically does not qualify.  He will continue with this.  This is a shared decision-making discussion.   Current medicines are reviewed at length with the patient today.  The patient does not have concerns regarding medicines.  The following changes have been made: As above  Labs/ tests ordered today include:  None  No orders of the defined types were placed in this encounter.     Disposition:   FU with me in 6 months  Signed, [name], MD  03/29/2022 8:43 AM    [facility]

## 2022-03-29 ENCOUNTER — Encounter: Payer: Self-pay | Admitting: Cardiology

## 2022-03-29 ENCOUNTER — Ambulatory Visit: Payer: Medicare PPO | Admitting: Cardiology

## 2022-03-29 VITALS — BP 130/72 | HR 74 | Ht 66.0 in | Wt 158.8 lb

## 2022-03-29 DIAGNOSIS — I480 Paroxysmal atrial fibrillation: Secondary | ICD-10-CM | POA: Diagnosis not present

## 2022-03-29 MED ORDER — APIXABAN 2.5 MG PO TABS: 2.5000 mg | ORAL_TABLET | Freq: Two times a day (BID) | ORAL | 3 refills | Status: DC

## 2022-03-29 MED ORDER — DILTIAZEM HCL 30 MG PO TABS: 30.0000 mg | ORAL_TABLET | Freq: Two times a day (BID) | ORAL | 3 refills | Status: DC

## 2022-03-29 NOTE — Patient Instructions (Signed)
Medication Instructions:   STOP AMIODARONE  STOP METOPROLOL  *If you need a refill on your cardiac medications before your next appointment, please call your pharmacy*   Follow-Up: At [facility], you and your health needs are our priority.  As part of our continuing mission to provide you with exceptional heart care, we have created designated Provider Care Teams.  These Care Teams include your primary Cardiologist (physician) and Advanced Practice Providers (APPs -  Physician Assistants and Nurse Practitioners) who all work together to provide you with the care you need, when you need it.  We recommend signing up for the patient portal called "MyChart".  Sign up information is provided on this After Visit Summary.  MyChart is used to connect with patients for Virtual Visits (Telemedicine).  Patients are able to view lab/test results, encounter notes, upcoming appointments, etc.  Non-urgent messages can be sent to your provider as well.   To learn more about what you can do with MyChart, go to [email or web address].    Your next appointment:   6 month(s)  The format for your next appointment:   In Person  Provider:   [name] MD

## 2022-04-07 DIAGNOSIS — M25511 Pain in right shoulder: Secondary | ICD-10-CM | POA: Diagnosis not present

## 2022-04-07 DIAGNOSIS — M19011 Primary osteoarthritis, right shoulder: Secondary | ICD-10-CM | POA: Diagnosis not present

## 2022-04-09 ENCOUNTER — Ambulatory Visit: Payer: Medicare PPO | Admitting: Cardiology

## 2022-04-14 ENCOUNTER — Ambulatory Visit: Payer: Medicare PPO | Admitting: Physician Assistant

## 2022-04-27 DIAGNOSIS — Z85828 Personal history of other malignant neoplasm of skin: Secondary | ICD-10-CM | POA: Diagnosis not present

## 2022-04-27 DIAGNOSIS — L570 Actinic keratosis: Secondary | ICD-10-CM | POA: Diagnosis not present

## 2022-04-27 DIAGNOSIS — D692 Other nonthrombocytopenic purpura: Secondary | ICD-10-CM | POA: Diagnosis not present

## 2022-04-28 DIAGNOSIS — M545 Low back pain, unspecified: Secondary | ICD-10-CM | POA: Diagnosis not present

## 2022-04-28 DIAGNOSIS — Z4889 Encounter for other specified surgical aftercare: Secondary | ICD-10-CM | POA: Diagnosis not present

## 2022-05-03 DIAGNOSIS — M545 Low back pain, unspecified: Secondary | ICD-10-CM | POA: Diagnosis not present

## 2022-05-05 DIAGNOSIS — M545 Low back pain, unspecified: Secondary | ICD-10-CM | POA: Diagnosis not present

## 2022-05-07 DIAGNOSIS — M545 Low back pain, unspecified: Secondary | ICD-10-CM | POA: Diagnosis not present

## 2022-05-10 DIAGNOSIS — M545 Low back pain, unspecified: Secondary | ICD-10-CM | POA: Diagnosis not present

## 2022-05-10 DIAGNOSIS — Z4889 Encounter for other specified surgical aftercare: Secondary | ICD-10-CM | POA: Diagnosis not present

## 2022-05-12 DIAGNOSIS — M545 Low back pain, unspecified: Secondary | ICD-10-CM | POA: Diagnosis not present

## 2022-05-12 DIAGNOSIS — Z4889 Encounter for other specified surgical aftercare: Secondary | ICD-10-CM | POA: Diagnosis not present

## 2022-05-14 DIAGNOSIS — Z4889 Encounter for other specified surgical aftercare: Secondary | ICD-10-CM | POA: Diagnosis not present

## 2022-05-14 DIAGNOSIS — M545 Low back pain, unspecified: Secondary | ICD-10-CM | POA: Diagnosis not present

## 2022-05-17 DIAGNOSIS — Z4889 Encounter for other specified surgical aftercare: Secondary | ICD-10-CM | POA: Diagnosis not present

## 2022-05-17 DIAGNOSIS — M545 Low back pain, unspecified: Secondary | ICD-10-CM | POA: Diagnosis not present

## 2022-05-19 DIAGNOSIS — M545 Low back pain, unspecified: Secondary | ICD-10-CM | POA: Diagnosis not present

## 2022-05-19 DIAGNOSIS — Z4889 Encounter for other specified surgical aftercare: Secondary | ICD-10-CM | POA: Diagnosis not present

## 2022-05-19 DIAGNOSIS — M19011 Primary osteoarthritis, right shoulder: Secondary | ICD-10-CM | POA: Diagnosis not present

## 2022-05-24 DIAGNOSIS — M545 Low back pain, unspecified: Secondary | ICD-10-CM | POA: Diagnosis not present

## 2022-05-24 DIAGNOSIS — Z4889 Encounter for other specified surgical aftercare: Secondary | ICD-10-CM | POA: Diagnosis not present

## 2022-05-26 DIAGNOSIS — M545 Low back pain, unspecified: Secondary | ICD-10-CM | POA: Diagnosis not present

## 2022-05-26 DIAGNOSIS — Z4889 Encounter for other specified surgical aftercare: Secondary | ICD-10-CM | POA: Diagnosis not present

## 2022-05-28 DIAGNOSIS — Z4889 Encounter for other specified surgical aftercare: Secondary | ICD-10-CM | POA: Diagnosis not present

## 2022-05-28 DIAGNOSIS — M545 Low back pain, unspecified: Secondary | ICD-10-CM | POA: Diagnosis not present

## 2022-06-16 DIAGNOSIS — M25511 Pain in right shoulder: Secondary | ICD-10-CM | POA: Diagnosis not present

## 2022-06-29 DIAGNOSIS — L820 Inflamed seborrheic keratosis: Secondary | ICD-10-CM | POA: Diagnosis not present

## 2022-06-29 DIAGNOSIS — L821 Other seborrheic keratosis: Secondary | ICD-10-CM | POA: Diagnosis not present

## 2022-06-29 DIAGNOSIS — L570 Actinic keratosis: Secondary | ICD-10-CM | POA: Diagnosis not present

## 2022-06-29 DIAGNOSIS — L814 Other melanin hyperpigmentation: Secondary | ICD-10-CM | POA: Diagnosis not present

## 2022-06-29 DIAGNOSIS — L853 Xerosis cutis: Secondary | ICD-10-CM | POA: Diagnosis not present

## 2022-07-05 DIAGNOSIS — M25811 Other specified joint disorders, right shoulder: Secondary | ICD-10-CM | POA: Diagnosis not present

## 2022-07-08 ENCOUNTER — Telehealth: Payer: Self-pay

## 2022-07-08 ENCOUNTER — Ambulatory Visit: Payer: Medicare PPO | Admitting: Cardiology

## 2022-07-08 NOTE — Telephone Encounter (Signed)
   Pre-operative Risk Assessment    Patient Name: Joseph [name]  DOB: 1936-08-08 MRN: [id number]      Request for Surgical Clearance    Procedure:   Right reverse shoulder arthroplasty   Date of Surgery:  Clearance TBD                                 Surgeon:  Dr. Justice Britain Surgeon's Group or Practice Name:  [facility]  Phone number:  [phone] Fax number:  [phone] Attn: Glendale Chard    Type of Clearance Requested:   - Medical  - Pharmacy:  Hold Apixaban (Eliquis) Need instructions    Type of Anesthesia:  General    Additional requests/questions:    Tana Conch   07/08/2022, 5:18 PM

## 2022-07-09 NOTE — Telephone Encounter (Signed)
   Name: Joseph [name]  DOB: 1935-08-20  MRN: [id number]  Primary Cardiologist: [name], MD   Preoperative team, please contact this patient and set up a phone call appointment for further preoperative risk assessment. Please obtain consent and complete medication review. Thank you for your help.  I confirm that guidance regarding antiplatelet and oral anticoagulation therapy has been completed and, if necessary, noted below.  Clinical pharmacist has addressed request for anticoagulation hold.   Deberah Pelton, NP 07/09/2022, 10:11 AM [name]

## 2022-07-09 NOTE — Telephone Encounter (Signed)
Patient with diagnosis of afib on Eliquis for anticoagulation.    Procedure: Right reverse shoulder arthroplasty   Date of procedure: TBD  CHA2DS2-VASc Score = 2  This indicates a 2.2% annual risk of stroke. The patient's score is based upon: CHF History: 0 HTN History: 0 Diabetes History: 0 Stroke History: 0 Vascular Disease History: 0 Age Score: 2 Gender Score: 0  CrCl 37m/min Platelet count 175K  Per office protocol, patient can hold Eliquis for 3 days prior to procedure.    **This guidance is not considered finalized until pre-operative APP has relayed final recommendations.**

## 2022-07-09 NOTE — Telephone Encounter (Signed)
I tried to call the pt to set up a tele pre op appt, but no answer or vm.

## 2022-07-12 DIAGNOSIS — Z01818 Encounter for other preprocedural examination: Secondary | ICD-10-CM | POA: Diagnosis not present

## 2022-07-12 DIAGNOSIS — I482 Chronic atrial fibrillation, unspecified: Secondary | ICD-10-CM | POA: Diagnosis not present

## 2022-07-12 DIAGNOSIS — I10 Essential (primary) hypertension: Secondary | ICD-10-CM | POA: Diagnosis not present

## 2022-07-13 ENCOUNTER — Telehealth: Payer: Self-pay | Admitting: *Deleted

## 2022-07-13 NOTE — Telephone Encounter (Signed)
Pt agreeable to plan of care for tele pre op appt 07/14/22 @ 10 am. Med rec and consent are done.     Patient Consent for Virtual Visit        Joseph [name] has provided verbal consent on 07/13/2022 for a virtual visit (video or telephone).   CONSENT FOR VIRTUAL VISIT FOR:  Joseph [name]  By participating in this virtual visit I agree to the following:  I hereby voluntarily request, consent and authorize [facility] and its employed or contracted physicians, physician assistants, nurse practitioners or other licensed health care professionals (the Practitioner), to provide me with telemedicine health care services (the "Services") as deemed necessary by the treating Practitioner. I acknowledge and consent to receive the Services by the Practitioner via telemedicine. I understand that the telemedicine visit will involve communicating with the Practitioner through live audiovisual communication technology and the disclosure of certain medical information by electronic transmission. I acknowledge that I have been given the opportunity to request an in-person assessment or other available alternative prior to the telemedicine visit and am voluntarily participating in the telemedicine visit.  I understand that I have the right to withhold or withdraw my consent to the use of telemedicine in the course of my care at any time, without affecting my right to future care or treatment, and that the Practitioner or I may terminate the telemedicine visit at any time. I understand that I have the right to inspect all information obtained and/or recorded in the course of the telemedicine visit and may receive copies of available information for a reasonable fee.  I understand that some of the potential risks of receiving the Services via telemedicine include:  Delay or interruption in medical evaluation due to technological equipment failure or disruption; Information transmitted may not be sufficient (e.g.  poor resolution of images) to allow for appropriate medical decision making by the Practitioner; and/or  In rare instances, security protocols could fail, causing a breach of personal health information.  Furthermore, I acknowledge that it is my responsibility to provide information about my medical history, conditions and care that is complete and accurate to the best of my ability. I acknowledge that Practitioner's advice, recommendations, and/or decision may be based on factors not within their control, such as incomplete or inaccurate data provided by me or distortions of diagnostic images or specimens that may result from electronic transmissions. I understand that the practice of medicine is not an exact science and that Practitioner makes no warranties or guarantees regarding treatment outcomes. I acknowledge that a copy of this consent can be made available to me via my patient portal ([facility]), or I can request a printed copy by calling the office of [facility].    I understand that my insurance will be billed for this visit.   I have read or had this consent read to me. I understand the contents of this consent, which adequately explains the benefits and risks of the Services being provided via telemedicine.  I have been provided ample opportunity to ask questions regarding this consent and the Services and have had my questions answered to my satisfaction. I give my informed consent for the services to be provided through the use of telemedicine in my medical care

## 2022-07-13 NOTE — Telephone Encounter (Signed)
Pt agreeable to plan of care for tele pre op appt 07/14/22 @ 10 am. Med rec and consent are done.

## 2022-07-14 ENCOUNTER — Ambulatory Visit: Payer: Medicare PPO | Attending: Cardiology | Admitting: Nurse Practitioner

## 2022-07-14 DIAGNOSIS — Z01810 Encounter for preprocedural cardiovascular examination: Secondary | ICD-10-CM | POA: Diagnosis not present

## 2022-07-14 NOTE — Progress Notes (Signed)
Virtual Visit via Telephone Note   Because of Joseph [name]'s co-morbid illnesses, he is at least at moderate risk for complications without adequate follow up.  This format is felt to be most appropriate for this patient at this time.  The patient did not have access to video technology/had technical difficulties with video requiring transitioning to audio format only (telephone).  All issues noted in this document were discussed and addressed.  No physical exam could be performed with this format.  Please refer to the patient's chart for his consent to telehealth for Joseph [name].  Evaluation Performed:  Preoperative cardiovascular risk assessment _____________   Date:  07/14/2022   Patient ID:  Joseph [name], DOB 1935/09/28, MRN [id number] Patient Location:  Home Provider location:   Office  Primary Care Provider:  [name], MD Primary Cardiologist:  [name], MD  Chief Complaint / Patient Profile   86 y.o. y/o male with a h/o chronic atrial fibrillation, arthritis, and GERD who is pending right reverse shoulder arthroplasty with Dr. Justice Britain of [facility] and presents today for telephonic preoperative cardiovascular risk assessment.  History of Present Illness    Joseph [name] is a 86 y.o. male who presents via audio/video conferencing for a telehealth visit today.  Pt was last seen in cardiology clinic on 03/29/2022 by Dr. Percival Spanish.  At that time Joseph [name] was doing well. The patient is now pending procedure as outlined above. Since his last visit, he has done well from a cardiac standpoint.   He denies chest pain, palpitations, dyspnea, pnd, orthopnea, n, v, dizziness, syncope, edema, weight gain, or early satiety. All other systems reviewed and are otherwise negative except as noted above.   Past Medical History    Past Medical History:  Diagnosis Date   Arthritis    Atrial fibrillation ([place])    resolved with cardioversion   Complication of anesthesia     Dysrhythmia    Atrial Fibrilation   GERD (gastroesophageal reflux disease)    Headache    occasional   History of skin cancer    Pneumonia    PONV (postoperative nausea and vomiting)    Prostate cancer ([place])    Sigmoid diverticulitis April of 2012   with small perforation   Squamous cell carcinoma    Past Surgical History:  Procedure Laterality Date   CARDIOVERSION  01/07/2009   LASIK     LUMBAR LAMINECTOMY/DECOMPRESSION MICRODISCECTOMY N/A 05/06/2021   Procedure: Lumbar decompression and insitu fusion Lumbar four - Lumbar five;  Surgeon: Melina Schools, MD;  Location: [place];  Service: Orthopedics;  Laterality: N/A;  3 hrs 3 C-Bed   LUMBAR LAMINECTOMY/DECOMPRESSION MICRODISCECTOMY N/A 03/08/2022   Procedure: THORACIC TWELVE TO LUMBAR FIVE DECOMPRESSION;  Surgeon: Melina Schools, MD;  Location: [place];  Service: Orthopedics;  Laterality: N/A;  2hr 3 C-Bed   LYMPHADENECTOMY Bilateral 01/17/2015   Procedure: PELVIC LYMPHADENECTOMY;  Surgeon: Alexis Frock, MD;  [facility];  Service: Urology;  Laterality: Bilateral;   NASAL SEPTUM SURGERY     ROBOT ASSISTED LAPAROSCOPIC RADICAL PROSTATECTOMY N/A 01/17/2015   Procedure: ROBOTIC ASSISTED LAPAROSCOPIC RADICAL PROSTATECTOMY WITH INDOCYANINE GREEN DYE INJECTION;  Surgeon: Alexis Frock, MD;  [facility];  Service: Urology;  Laterality: N/A;   SHOULDER SURGERY     x 2   TONSILLECTOMY     as a child    Allergies  Allergies  Allergen Reactions   Poison Ivy Extract Rash   Tetracycline Hcl Rash  Home Medications    Prior to Admission medications   Medication Sig Start Date End Date Taking? Authorizing Provider  acetaminophen (TYLENOL) 650 MG CR tablet Take 650 mg by mouth every 8 (eight) hours as needed for pain.    [provider]  apixaban (ELIQUIS) 2.5 MG TABS tablet Take 1 tablet (2.5 mg total) by mouth 2 (two) times daily. 03/29/22   [name], MD  diltiazem (CARDIZEM) 30 MG tablet Take 1 tablet (30  mg total) by mouth every 12 (twelve) hours. 03/29/22   [name], MD    Physical Exam    Vital Signs:  Joseph [name] does not have vital signs available for review today.  Given telephonic nature of communication, physical exam is limited. AAOx3. NAD. Normal affect.  Speech and respirations are unlabored.  Accessory Clinical Findings    None  Assessment & Plan    1.  Preoperative Cardiovascular Risk Assessment:  According to the Revised Cardiac Risk Index (RCRI), his Perioperative Risk of Major Cardiac Event is (%): 0.4. His Functional Capacity in METs is: 5.81 according to the Duke Activity Status Index (DASI).Therefore, based on ACC/AHA guidelines, patient would be at acceptable risk for the planned procedure without further cardiovascular testing.  The patient was advised that if he develops new symptoms prior to surgery to contact our office to arrange for a follow-up visit, and he verbalized understanding.   Per office protocol, patient can hold Eliquis for 3 days prior to procedure.  Please resume Eliquis as soon as possible postprocedure, at the discretion of the surgeon.   A copy of this note will be routed to requesting surgeon.  Time:   Today, I have spent 5 minutes with the patient with telehealth technology discussing medical history, symptoms, and management plan.     Lenna Sciara, NP  07/14/2022, 10:10 AM

## 2022-07-19 DIAGNOSIS — Z85828 Personal history of other malignant neoplasm of skin: Secondary | ICD-10-CM | POA: Diagnosis not present

## 2022-07-19 DIAGNOSIS — L853 Xerosis cutis: Secondary | ICD-10-CM | POA: Diagnosis not present

## 2022-07-19 DIAGNOSIS — Z125 Encounter for screening for malignant neoplasm of prostate: Secondary | ICD-10-CM | POA: Diagnosis not present

## 2022-07-19 DIAGNOSIS — D692 Other nonthrombocytopenic purpura: Secondary | ICD-10-CM | POA: Diagnosis not present

## 2022-07-19 DIAGNOSIS — L821 Other seborrheic keratosis: Secondary | ICD-10-CM | POA: Diagnosis not present

## 2022-07-19 DIAGNOSIS — R7303 Prediabetes: Secondary | ICD-10-CM | POA: Diagnosis not present

## 2022-07-19 DIAGNOSIS — L820 Inflamed seborrheic keratosis: Secondary | ICD-10-CM | POA: Diagnosis not present

## 2022-07-19 DIAGNOSIS — I10 Essential (primary) hypertension: Secondary | ICD-10-CM | POA: Diagnosis not present

## 2022-07-19 DIAGNOSIS — L570 Actinic keratosis: Secondary | ICD-10-CM | POA: Diagnosis not present

## 2022-07-22 DIAGNOSIS — I482 Chronic atrial fibrillation, unspecified: Secondary | ICD-10-CM | POA: Diagnosis not present

## 2022-07-22 DIAGNOSIS — R413 Other amnesia: Secondary | ICD-10-CM | POA: Diagnosis not present

## 2022-07-22 DIAGNOSIS — D6869 Other thrombophilia: Secondary | ICD-10-CM | POA: Diagnosis not present

## 2022-07-22 DIAGNOSIS — Z23 Encounter for immunization: Secondary | ICD-10-CM | POA: Diagnosis not present

## 2022-07-22 DIAGNOSIS — R32 Unspecified urinary incontinence: Secondary | ICD-10-CM | POA: Diagnosis not present

## 2022-07-22 DIAGNOSIS — D692 Other nonthrombocytopenic purpura: Secondary | ICD-10-CM | POA: Diagnosis not present

## 2022-07-22 DIAGNOSIS — Z0000 Encounter for general adult medical examination without abnormal findings: Secondary | ICD-10-CM | POA: Diagnosis not present

## 2022-07-22 DIAGNOSIS — I119 Hypertensive heart disease without heart failure: Secondary | ICD-10-CM | POA: Diagnosis not present

## 2022-07-22 DIAGNOSIS — Z8546 Personal history of malignant neoplasm of prostate: Secondary | ICD-10-CM | POA: Diagnosis not present

## 2022-07-27 NOTE — Patient Instructions (Signed)
SURGICAL WAITING ROOM VISITATION Patients having surgery or a procedure may have no more than 2 support people in the waiting area - these visitors may rotate.   Children under the age of 37 must have an adult with them who is not the patient. If the patient needs to stay at the hospital during part of their recovery, the visitor guidelines for inpatient rooms apply. Pre-op nurse will coordinate an appropriate time for 1 support person to accompany patient in pre-op.  This support person may not rotate.    Please refer to the [facility] website for the visitor guidelines for Inpatients (after your surgery is over and you are in a regular room).       Your procedure is scheduled on:  08/19/2022    Report to [facility] Main Entrance    Report to admitting at  0730 AM   Call this number if you have problems the morning of surgery [phone]   Do not eat food :After Midnight.   After Midnight you may have the following liquids until _ 0700_____ AM  DAY OF SURGERY  Water Non-Citrus Juices (without pulp, NO RED) Carbonated Beverages Black Coffee (NO MILK/CREAM OR CREAMERS, sugar ok)  Clear Tea (NO MILK/CREAM OR CREAMERS, sugar ok) regular and decaf                             Plain Jell-O (NO RED)                                           Fruit ices (not with fruit pulp, NO RED)                                     Popsicles (NO RED)                                                               Sports drinks like Gatorade (NO RED)                    The day of surgery:  Drink ONE (1) Pre-Surgery Clear Ensure or G2 at   0700 AM ( have completed by )  the morning of surgery. Drink in one sitting. Do not sip.  This drink was given to you during your hospital  pre-op appointment visit. Nothing else to drink after completing the  Pre-Surgery Clear Ensure or G2.          If you have questions, please contact your surgeon's office.       Oral Hygiene is also important to  reduce your risk of infection.                                    Remember - BRUSH YOUR TEETH THE MORNING OF SURGERY WITH YOUR REGULAR TOOTHPASTE  DENTURES WILL BE REMOVED PRIOR TO SURGERY PLEASE DO NOT APPLY "Poly grip" OR ADHESIVES!!!   Do NOT smoke after Midnight   Take  these medicines the morning of surgery with A SIP OF WATER:   cardizem   DO NOT TAKE ANY ORAL DIABETIC MEDICATIONS DAY OF YOUR SURGERY  Bring CPAP mask and tubing day of surgery.                              You may not have any metal on your body including hair pins, jewelry, and body piercing             Do not wear make-up, lotions, powders, perfumes/cologne, or deodorant  Do not wear nail polish including gel and S&S, artificial/acrylic nails, or any other type of covering on natural nails including finger and toenails. If you have artificial nails, gel coating, etc. that needs to be removed by a nail salon please have this removed prior to surgery or surgery may need to be canceled/ delayed if the surgeon/ anesthesia feels like they are unable to be safely monitored.   Do not shave  48 hours prior to surgery.               Men may shave face and neck.   Do not bring valuables to the hospital. [place].   Contacts, glasses, dentures or bridgework may not be worn into surgery.   Bring small overnight bag day of surgery.   DO NOT [place]. PHARMACY WILL DISPENSE MEDICATIONS LISTED ON YOUR MEDICATION LIST TO YOU DURING YOUR ADMISSION [place]!    Patients discharged on the day of surgery will not be allowed to drive home.  Someone NEEDS to stay with you for the first 24 hours after anesthesia.   Special Instructions: Bring a copy of your healthcare power of attorney and living will documents the day of surgery if you haven't scanned them before.              Please read over the following fact sheets you were given: IF  [place] [phone]   If you received a COVID test during your pre-op visit  it is requested that you wear a mask when out in public, stay away from anyone that may not be feeling well and notify your surgeon if you develop symptoms. If you test positive for Covid or have been in contact with anyone that has tested positive in the last 10 days please notify you surgeon.    [place] - Preparing for Surgery Before surgery, you can play an important role.  Because skin is not sterile, your skin needs to be as free of germs as possible.  You can reduce the number of germs on your skin by washing with CHG (chlorahexidine gluconate) soap before surgery.  CHG is an antiseptic cleaner which kills germs and bonds with the skin to continue killing germs even after washing. Please DO NOT use if you have an allergy to CHG or antibacterial soaps.  If your skin becomes reddened/irritated stop using the CHG and inform your nurse when you arrive at Short Stay. Do not shave (including legs and underarms) for at least 48 hours prior to the first CHG shower.  You may shave your face/neck. Please follow these instructions carefully:  1.  Shower with CHG Soap the night before surgery and the  morning of Surgery.  2.  If you choose to wash your hair, wash your hair first as usual with your  normal  shampoo.  3.  After you shampoo, rinse your hair and body thoroughly to remove the  shampoo.                           4.  Use CHG as you would any other liquid soap.  You can apply chg directly  to the skin and wash                       Gently with a scrungie or clean washcloth.  5.  Apply the CHG Soap to your body ONLY FROM THE NECK DOWN.   Do not use on face/ open                           Wound or open sores. Avoid contact with eyes, ears mouth and genitals (private parts).                       Wash face,  Genitals (private parts) with your normal soap.              6.  Wash thoroughly, paying special attention to the area where your surgery  will be performed.  7.  Thoroughly rinse your body with warm water from the neck down.  8.  DO NOT shower/wash with your normal soap after using and rinsing off  the CHG Soap.                9.  Pat yourself dry with a clean towel.            10.  Wear clean pajamas.            11.  Place clean sheets on your bed the night of your first shower and do not  sleep with pets. Day of Surgery : Do not apply any lotions/deodorants the morning of surgery.  Please wear clean clothes to the hospital/surgery center.  FAILURE TO FOLLOW THESE INSTRUCTIONS MAY RESULT IN THE CANCELLATION OF YOUR SURGERY PATIENT SIGNATURE_________________________________  NURSE SIGNATURE__________________________________  ________________________________________________________________________

## 2022-07-27 NOTE — Progress Notes (Addendum)
Anesthesia Review:  PCP: DR Shelia Media- clearance on chart dated 07/12/22 with LOV 07/12/22  Cardiologist : DR [name],  Diona Browner, NP LOV 07/14/22  Chest x-ray : 12/31/21- 1 view  EKG : 03/29/22  Echo : 01/01/22  Stress test: Cardiac Cath :  Activity level: can do a flight of stairs without difficulty  Sleep Study/ CPAP : none  Fasting Blood Sugar :      / Checks Blood Sugar -- times a day:   Blood Thinner/ Instructions /Last Dose: ASA / Instructions/ Last Dose :    Eliquis- - stop 3 days prior   PT lives at [place] with wife in [place].  Pt dispenses his own meds.   Blood pressure at  preop was 152/101 in left arm and 151/91 in right arm.  Pt denies any chest pain, shortness of breath, dizziness, headache or blurried vision.  Wife with pt at preop appt.  PT states there is a Marine scientist at [facility] to check blood pressure and pt has his own cuff at home to monitor pressure.  PT also states on Tuesday afternoons there is a blood pressure check there at [facility].  PT given copy of blood pressure readins from preop appt and instructed pt to monitor blood pressure at home and to notify  DR Shelia Media if does not improve and to also have nurse monitor blood pressure.  Pt voiced understanding.     Pt was diaper with pad since prostate surgery per pt .  PT to bring supply of diapers  with pads to hospital on day of surgery.

## 2022-08-03 ENCOUNTER — Encounter (HOSPITAL_COMMUNITY): Payer: Self-pay

## 2022-08-03 ENCOUNTER — Encounter (HOSPITAL_COMMUNITY)
Admission: RE | Admit: 2022-08-03 | Discharge: 2022-08-03 | Disposition: A | Discharge status: Home / Self Care | Payer: Medicare PPO | Source: Ambulatory Visit | Attending: Orthopedic Surgery | Admitting: Orthopedic Surgery

## 2022-08-03 ENCOUNTER — Other Ambulatory Visit: Payer: Self-pay

## 2022-08-03 VITALS — BP 151/91 | HR 81 | Temp 98.4°F | Resp 16 | Ht 66.5 in | Wt 163.0 lb

## 2022-08-03 DIAGNOSIS — Z01812 Encounter for preprocedural laboratory examination: Secondary | ICD-10-CM | POA: Diagnosis not present

## 2022-08-03 DIAGNOSIS — Z01818 Encounter for other preprocedural examination: Secondary | ICD-10-CM

## 2022-08-03 LAB — BASIC METABOLIC PANEL
Anion gap: 8 (ref 5–15)
BUN: 17 mg/dL (ref 8–23)
CO2: 24 mmol/L (ref 22–32)
Calcium: 9 mg/dL (ref 8.9–10.3)
Chloride: 107 mmol/L (ref 98–111)
Creatinine, Ser: 1.38 mg/dL — ABNORMAL HIGH (ref 0.61–1.24)
GFR, Estimated: 50 mL/min — ABNORMAL LOW (ref >60)
Glucose, Bld: 98 mg/dL (ref 70–99)
Potassium: 4.1 mmol/L (ref 3.5–5.1)
Sodium: 139 mmol/L (ref 135–145)

## 2022-08-03 LAB — CBC
HCT: 46.8 % (ref 39.0–52.0)
Hemoglobin: 15.5 g/dL (ref 13.0–17.0)
MCH: 33.7 pg (ref 26.0–34.0)
MCHC: 33.1 g/dL (ref 30.0–36.0)
MCV: 101.7 fL — ABNORMAL HIGH (ref 80.0–100.0)
Platelets: 246 10*3/uL (ref 150–400)
RBC: 4.6 MIL/uL (ref 4.22–5.81)
RDW: 13 % (ref 11.5–15.5)
WBC: 7.6 10*3/uL (ref 4.0–10.5)
nRBC: 0 % (ref 0.0–0.2)

## 2022-08-03 LAB — SURGICAL PCR SCREEN
MRSA, PCR: NEGATIVE
Staphylococcus aureus: NEGATIVE

## 2022-08-03 NOTE — Progress Notes (Signed)
[place]- Preparing for Total Shoulder Arthroplasty  °  °Before surgery, you can play an important role. Because skin is not sterile, your skin needs to be as free of germs as possible. You can reduce the number of germs on your skin by using the following products. °Benzoyl Peroxide Gel °Reduces the number of germs present on the skin °Applied twice a day to shoulder area starting two days before surgery   ° °================================================================== ° °Please follow these instructions carefully: ° °BENZOYL PEROXIDE 5% GEL ° °Please do not use if you have an allergy to benzoyl peroxide.   If your skin becomes reddened/irritated stop using the benzoyl peroxide. ° °Starting two days before surgery, apply as follows: °Apply benzoyl peroxide in the morning and at night. Apply after taking a shower. If you are not taking a shower clean entire shoulder front, back, and side along with the armpit with a clean wet washcloth. ° °Place a quarter-sized dollop on your shoulder and rub in thoroughly, making sure to cover the front, back, and side of your shoulder, along with the armpit.  ° °2 days before ____ AM   ____ PM              1 day before ____ AM   ____ PM °                        °Do this twice a day for two days.  (Last application is the night before surgery, AFTER using the CHG soap as described below). ° °Do NOT apply benzoyl peroxide gel on the day of surgery.  °

## 2022-08-12 NOTE — Progress Notes (Signed)
Anesthesia Chart Review   Case: [id number] Date/Time: 08/19/22 0945   Procedure: REVERSE SHOULDER ARTHROPLASTY (Right: Shoulder) - 114mn   Anesthesia type: General   Pre-op diagnosis: Right shoulder rotator cuff tear arthropathy   [facility]   Surgeons: SJustice Britain MD       DISCUSSION:87 y.o. never smoker with h/o PONV, atrial fibrillation, right shoulder OA scheduled for above procedure 08/19/2022 with Dr. KJustice Britain   Pt seen by cardiology 07/14/2022. Per OV note, "According to the Revised Cardiac Risk Index (RCRI), his Perioperative Risk of Major Cardiac Event is (%): 0.4. His Functional Capacity in METs is: 5.81 according to the Duke Activity Status Index (DASI).Therefore, based on ACC/AHA guidelines, patient would be at acceptable risk for the planned procedure without further cardiovascular testing.   The patient was advised that if he develops new symptoms prior to surgery to contact our office to arrange for a follow-up visit, and he verbalized understanding.  Per office protocol, patient can hold Eliquis for 3 days prior to procedure.  Please resume Eliquis as soon as possible postprocedure, at the discretion of the surgeon."  Pt reports last dose of Eliquis 08/15/2022 in the evening.   Anticipate pt can proceed with planned procedure barring acute status change.   VS: There were no vitals taken for this visit.  PROVIDERS: PDeland Pretty MD is PCP   Cardiologist : DR JMinus Breeding LABS: Labs reviewed: Acceptable for surgery. (all labs ordered are listed, but only abnormal results are displayed)  Labs Reviewed - No data to display   IMAGES:   EKG:   CV: Echo 01/01/2022 1. Left ventricular ejection fraction, by estimation, is 55 to 60%. The  left ventricle has normal function. The left ventricle has no regional  wall motion abnormalities. Left ventricular diastolic parameters are  indeterminate.   2. Right ventricular systolic function is  normal. The right ventricular  size is mildly enlarged. Tricuspid regurgitation signal is inadequate for  assessing PA pressure. The estimated right ventricular systolic pressure  is 345.4mmHg.   3. Left atrial size was mildly dilated.   4. Right atrial size was mildly dilated.   5. The mitral valve is degenerative. Mild mitral valve regurgitation. No  evidence of mitral stenosis. Moderate mitral annular calcification.   6. The aortic valve is grossly normal. There is mild calcification of the  aortic valve. There is moderate thickening of the aortic valve. Aortic  valve regurgitation is not visualized. Aortic valve sclerosis is present,  with no evidence of aortic valve  stenosis.   7. The inferior vena cava is normal in size with <50% respiratory  variability, suggesting right atrial pressure of 8 mmHg.  Past Medical History:  Diagnosis Date   Arthritis    [place]    resolved with cardioversion   Complication of anesthesia    Dysrhythmia    Atrial Fibrilation   GERD (gastroesophageal reflux disease)    Headache    occasional   History of skin cancer    Pneumonia    PONV (postoperative nausea and vomiting)    Prostate cancer ([place]    Sigmoid diverticulitis April of 2012   with small perforation   Squamous cell carcinoma     Past Surgical History:  Procedure Laterality Date   CARDIOVERSION  01/07/2009   LASIK     LUMBAR LAMINECTOMY/DECOMPRESSION MICRODISCECTOMY N/A 05/06/2021   Procedure: Lumbar decompression and insitu fusion Lumbar four - Lumbar five;  Surgeon:  Melina Schools, MD;  Location: [place];  Service: Orthopedics;  Laterality: N/A;  3 hrs 3 C-Bed   LUMBAR LAMINECTOMY/DECOMPRESSION MICRODISCECTOMY N/A 03/08/2022   Procedure: THORACIC TWELVE TO LUMBAR FIVE DECOMPRESSION;  Surgeon: Melina Schools, MD;  Location: [place];  Service: Orthopedics;  Laterality: N/A;  2hr 3 C-Bed   LYMPHADENECTOMY Bilateral 01/17/2015   Procedure: PELVIC LYMPHADENECTOMY;   Surgeon: Alexis Frock, MD;  [facility];  Service: Urology;  Laterality: Bilateral;   NASAL SEPTUM SURGERY     ROBOT ASSISTED LAPAROSCOPIC RADICAL PROSTATECTOMY N/A 01/17/2015   Procedure: ROBOTIC ASSISTED LAPAROSCOPIC RADICAL PROSTATECTOMY WITH INDOCYANINE GREEN DYE INJECTION;  Surgeon: Alexis Frock, MD;  [facility];  Service: Urology;  Laterality: N/A;   SHOULDER SURGERY     x 2   TONSILLECTOMY     as a child    MEDICATIONS: No current facility-administered medications for this encounter.    acetaminophen (TYLENOL) 650 MG CR tablet   apixaban (ELIQUIS) 2.5 MG TABS tablet   Cyanocobalamin (B-12 PO)   diltiazem (CARDIZEM) 30 MG tablet   OVER THE COUNTER MEDICATION   polyvinyl alcohol (LIQUIFILM TEARS) 1.4 % ophthalmic solution   TURMERIC PO   Joseph [name] Ward, PA-C [facility] Pre-Surgical Testing [phone]

## 2022-08-16 DIAGNOSIS — L853 Xerosis cutis: Secondary | ICD-10-CM | POA: Diagnosis not present

## 2022-08-16 DIAGNOSIS — Z85828 Personal history of other malignant neoplasm of skin: Secondary | ICD-10-CM | POA: Diagnosis not present

## 2022-08-16 DIAGNOSIS — L570 Actinic keratosis: Secondary | ICD-10-CM | POA: Diagnosis not present

## 2022-08-16 NOTE — Anesthesia Preprocedure Evaluation (Signed)
Anesthesia Evaluation  Patient identified by MRN, date of birth, ID band Patient awake    Reviewed: Allergy & Precautions, NPO status , Patient's Chart, lab work & pertinent test results  History of Anesthesia Complications (+) PONV and history of anesthetic complications  Airway Mallampati: II  TM Distance: >3 FB Neck ROM: Full    Dental  (+) Dental Advisory Given   Pulmonary neg pulmonary ROS   breath sounds clear to auscultation       Cardiovascular + dysrhythmias Atrial Fibrillation  Rhythm:Regular Rate:Normal     Neuro/Psych negative neurological ROS     GI/Hepatic Neg liver ROS,GERD  ,,  Endo/Other  negative endocrine ROS    Renal/GU CRFRenal disease     Musculoskeletal  (+) Arthritis ,    Abdominal   Peds  Hematology negative hematology ROS (+)   Anesthesia Other Findings   Reproductive/Obstetrics                             Anesthesia Physical Anesthesia Plan  ASA: 3  Anesthesia Plan: General   Post-op Pain Management: Tylenol PO (pre-op)* and Regional block*   Induction: Intravenous  PONV Risk Score and Plan: 2 and Dexamethasone, Ondansetron and Treatment may vary due to age or medical condition  Airway Management Planned: Oral ETT  Additional Equipment: None  Intra-op Plan:   Post-operative Plan: Extubation in OR  Informed Consent: I have reviewed the patients History and Physical, chart, labs and discussed the procedure including the risks, benefits and alternatives for the proposed anesthesia with the patient or authorized representative who has indicated his/her understanding and acceptance.     Dental advisory given  Plan Discussed with: CRNA  Anesthesia Plan Comments:        Anesthesia Quick Evaluation

## 2022-08-18 DIAGNOSIS — L719 Rosacea, unspecified: Secondary | ICD-10-CM | POA: Diagnosis not present

## 2022-08-18 DIAGNOSIS — Z8546 Personal history of malignant neoplasm of prostate: Secondary | ICD-10-CM | POA: Diagnosis not present

## 2022-08-18 DIAGNOSIS — H43813 Vitreous degeneration, bilateral: Secondary | ICD-10-CM | POA: Diagnosis not present

## 2022-08-18 DIAGNOSIS — H2512 Age-related nuclear cataract, left eye: Secondary | ICD-10-CM | POA: Diagnosis not present

## 2022-08-18 DIAGNOSIS — H0288A Meibomian gland dysfunction right eye, upper and lower eyelids: Secondary | ICD-10-CM | POA: Diagnosis not present

## 2022-08-18 DIAGNOSIS — H04123 Dry eye syndrome of bilateral lacrimal glands: Secondary | ICD-10-CM | POA: Diagnosis not present

## 2022-08-18 DIAGNOSIS — H11041 Peripheral pterygium, stationary, right eye: Secondary | ICD-10-CM | POA: Diagnosis not present

## 2022-08-18 DIAGNOSIS — H0288B Meibomian gland dysfunction left eye, upper and lower eyelids: Secondary | ICD-10-CM | POA: Diagnosis not present

## 2022-08-18 DIAGNOSIS — C61 Malignant neoplasm of prostate: Secondary | ICD-10-CM | POA: Diagnosis not present

## 2022-08-19 ENCOUNTER — Other Ambulatory Visit: Payer: Self-pay

## 2022-08-19 ENCOUNTER — Ambulatory Visit (HOSPITAL_COMMUNITY): Payer: Medicare PPO | Admitting: Physician Assistant

## 2022-08-19 ENCOUNTER — Encounter (HOSPITAL_COMMUNITY): Payer: Self-pay | Admitting: Orthopedic Surgery

## 2022-08-19 ENCOUNTER — Ambulatory Visit (HOSPITAL_COMMUNITY)
Admission: RE | Admit: 2022-08-19 | Discharge: 2022-08-19 | Disposition: A | Discharge status: Home / Self Care | Payer: Medicare PPO | Attending: Orthopedic Surgery | Admitting: Orthopedic Surgery

## 2022-08-19 ENCOUNTER — Encounter (HOSPITAL_COMMUNITY)
Admission: RE | Disposition: A | Discharge status: Home / Self Care | Payer: Self-pay | Source: Home / Self Care | Attending: Orthopedic Surgery

## 2022-08-19 DIAGNOSIS — M12811 Other specific arthropathies, not elsewhere classified, right shoulder: Secondary | ICD-10-CM

## 2022-08-19 DIAGNOSIS — G8918 Other acute postprocedural pain: Secondary | ICD-10-CM | POA: Diagnosis not present

## 2022-08-19 DIAGNOSIS — M75101 Unspecified rotator cuff tear or rupture of right shoulder, not specified as traumatic: Secondary | ICD-10-CM | POA: Diagnosis not present

## 2022-08-19 DIAGNOSIS — N189 Chronic kidney disease, unspecified: Secondary | ICD-10-CM | POA: Insufficient documentation

## 2022-08-19 DIAGNOSIS — I4891 Unspecified atrial fibrillation: Secondary | ICD-10-CM | POA: Diagnosis not present

## 2022-08-19 DIAGNOSIS — M19011 Primary osteoarthritis, right shoulder: Secondary | ICD-10-CM | POA: Diagnosis not present

## 2022-08-19 DIAGNOSIS — N182 Chronic kidney disease, stage 2 (mild): Secondary | ICD-10-CM | POA: Diagnosis not present

## 2022-08-19 DIAGNOSIS — M25711 Osteophyte, right shoulder: Secondary | ICD-10-CM | POA: Diagnosis not present

## 2022-08-19 DIAGNOSIS — Z01818 Encounter for other preprocedural examination: Secondary | ICD-10-CM

## 2022-08-19 HISTORY — PX: REVERSE SHOULDER ARTHROPLASTY: SHX5054

## 2022-08-19 SURGERY — ARTHROPLASTY, SHOULDER, TOTAL, REVERSE
Anesthesia: General | Site: Shoulder | Laterality: Right

## 2022-08-19 MED ORDER — MIDAZOLAM HCL 2 MG/2ML IJ SOLN
1.0000 mg | Freq: Once | INTRAMUSCULAR | Status: DC
  Filled 2022-08-19: qty 2

## 2022-08-19 MED ORDER — CEFAZOLIN SODIUM-DEXTROSE 2-4 GM/100ML-% IV SOLN
2.0000 g | INTRAVENOUS | Status: AC
  Administered 2022-08-19: 2 g via INTRAVENOUS
  Filled 2022-08-19: qty 100

## 2022-08-19 MED ORDER — FENTANYL CITRATE PF 50 MCG/ML IJ SOSY
50.0000 ug | PREFILLED_SYRINGE | Freq: Once | INTRAMUSCULAR | Status: AC
  Administered 2022-08-19: 50 ug via INTRAVENOUS
  Filled 2022-08-19: qty 2

## 2022-08-19 MED ORDER — LACTATED RINGERS IV BOLUS
500.0000 mL | Freq: Once | INTRAVENOUS | Status: AC
  Administered 2022-08-19: 500 mL via INTRAVENOUS

## 2022-08-19 MED ORDER — LIDOCAINE HCL (PF) 2 % IJ SOLN
INTRAMUSCULAR | Status: AC
  Filled 2022-08-19: qty 5

## 2022-08-19 MED ORDER — LACTATED RINGERS IV BOLUS
250.0000 mL | Freq: Once | INTRAVENOUS | Status: AC
  Administered 2022-08-19: 250 mL via INTRAVENOUS

## 2022-08-19 MED ORDER — BUPIVACAINE LIPOSOME 1.3 % IJ SUSP
INTRAMUSCULAR | Status: DC | PRN
  Administered 2022-08-19: 10 mL via PERINEURAL

## 2022-08-19 MED ORDER — LIDOCAINE 2% (20 MG/ML) 5 ML SYRINGE
INTRAMUSCULAR | Status: DC | PRN
  Administered 2022-08-19: 60 mg via INTRAVENOUS

## 2022-08-19 MED ORDER — TRANEXAMIC ACID 1000 MG/10ML IV SOLN: 1000.0000 mg | INTRAVENOUS | Status: DC

## 2022-08-19 MED ORDER — PHENYLEPHRINE HCL-NACL 20-0.9 MG/250ML-% IV SOLN
INTRAVENOUS | Status: DC | PRN
  Administered 2022-08-19: 40 ug/min via INTRAVENOUS

## 2022-08-19 MED ORDER — VANCOMYCIN HCL 1000 MG IV SOLR
INTRAVENOUS | Status: AC
  Filled 2022-08-19: qty 20

## 2022-08-19 MED ORDER — VANCOMYCIN HCL 1000 MG IV SOLR
INTRAVENOUS | Status: DC | PRN
  Administered 2022-08-19: 1000 mg

## 2022-08-19 MED ORDER — ONDANSETRON HCL 4 MG/2ML IJ SOLN
INTRAMUSCULAR | Status: DC | PRN
  Administered 2022-08-19: 4 mg via INTRAVENOUS

## 2022-08-19 MED ORDER — ORAL CARE MOUTH RINSE: 15.0000 mL | Freq: Once | OROMUCOSAL | Status: AC

## 2022-08-19 MED ORDER — ACETAMINOPHEN 500 MG PO TABS: 1000.0000 mg | ORAL_TABLET | Freq: Once | ORAL | Status: DC

## 2022-08-19 MED ORDER — DEXAMETHASONE SODIUM PHOSPHATE 10 MG/ML IJ SOLN
INTRAMUSCULAR | Status: AC
  Filled 2022-08-19: qty 1

## 2022-08-19 MED ORDER — AMISULPRIDE (ANTIEMETIC) 5 MG/2ML IV SOLN: 10.0000 mg | Freq: Once | INTRAVENOUS | Status: DC | PRN

## 2022-08-19 MED ORDER — ROCURONIUM BROMIDE 10 MG/ML (PF) SYRINGE
PREFILLED_SYRINGE | INTRAVENOUS | Status: DC | PRN
  Administered 2022-08-19: 10 mg via INTRAVENOUS
  Administered 2022-08-19: 40 mg via INTRAVENOUS
  Administered 2022-08-19: 10 mg via INTRAVENOUS

## 2022-08-19 MED ORDER — TRANEXAMIC ACID-NACL 1000-0.7 MG/100ML-% IV SOLN
1000.0000 mg | INTRAVENOUS | Status: AC
  Administered 2022-08-19: 1000 mg via INTRAVENOUS
  Filled 2022-08-19: qty 100

## 2022-08-19 MED ORDER — LACTATED RINGERS IV SOLN: INTRAVENOUS | Status: DC

## 2022-08-19 MED ORDER — PROPOFOL 10 MG/ML IV BOLUS
INTRAVENOUS | Status: AC
  Filled 2022-08-19: qty 20

## 2022-08-19 MED ORDER — DEXAMETHASONE SODIUM PHOSPHATE 10 MG/ML IJ SOLN
INTRAMUSCULAR | Status: DC | PRN
  Administered 2022-08-19: 4 mg via INTRAVENOUS

## 2022-08-19 MED ORDER — 0.9 % SODIUM CHLORIDE (POUR BTL) OPTIME
TOPICAL | Status: DC | PRN
  Administered 2022-08-19: 1000 mL

## 2022-08-19 MED ORDER — STERILE WATER FOR IRRIGATION IR SOLN
Status: DC | PRN
  Administered 2022-08-19: 2000 mL

## 2022-08-19 MED ORDER — SUGAMMADEX SODIUM 200 MG/2ML IV SOLN
INTRAVENOUS | Status: DC | PRN
  Administered 2022-08-19: 200 mg via INTRAVENOUS

## 2022-08-19 MED ORDER — PROPOFOL 10 MG/ML IV BOLUS
INTRAVENOUS | Status: DC | PRN
  Administered 2022-08-19: 110 mg via INTRAVENOUS

## 2022-08-19 MED ORDER — BUPIVACAINE-EPINEPHRINE (PF) 0.5% -1:200000 IJ SOLN
INTRAMUSCULAR | Status: DC | PRN
  Administered 2022-08-19: 15 mL via PERINEURAL

## 2022-08-19 MED ORDER — ONDANSETRON HCL 4 MG/2ML IJ SOLN
INTRAMUSCULAR | Status: AC
  Filled 2022-08-19: qty 2

## 2022-08-19 MED ORDER — FENTANYL CITRATE PF 50 MCG/ML IJ SOSY: 25.0000 ug | PREFILLED_SYRINGE | INTRAMUSCULAR | Status: DC | PRN

## 2022-08-19 MED ORDER — CHLORHEXIDINE GLUCONATE 0.12 % MT SOLN
15.0000 mL | Freq: Once | OROMUCOSAL | Status: AC
  Administered 2022-08-19: 15 mL via OROMUCOSAL

## 2022-08-19 SURGICAL SUPPLY — 65 items
BAG COUNTER SPONGE SURGICOUNT (BAG) IMPLANT
BAG ZIPLOCK 12X15 (MISCELLANEOUS) ×1 IMPLANT
BIT DRILL AR 3 (BIT) ×1
BIT DRILL AR 3 NS (BIT) IMPLANT
BLADE SAW SGTL 83.5X18.5 (BLADE) ×1 IMPLANT
BNDG COHESIVE 4X5 TAN STRL LF (GAUZE/BANDAGES/DRESSINGS) ×1 IMPLANT
COOLER ICEMAN CLASSIC (MISCELLANEOUS) ×1 IMPLANT
COVER BACK TABLE 60X90IN (DRAPES) ×1 IMPLANT
COVER SURGICAL LIGHT HANDLE (MISCELLANEOUS) ×1 IMPLANT
CUP SUT UNIV REVERS 39 NEU (Shoulder) IMPLANT
DERMABOND ADVANCED .7 DNX12 (GAUZE/BANDAGES/DRESSINGS) ×1 IMPLANT
DRAPE ORTHO SPLIT 77X108 STRL (DRAPES) ×2
DRAPE SHEET LG 3/4 BI-LAMINATE (DRAPES) ×1 IMPLANT
DRAPE SURG 17X11 SM STRL (DRAPES) ×1 IMPLANT
DRAPE SURG ORHT 6 SPLT 77X108 (DRAPES) ×2 IMPLANT
DRAPE TOP 10253 STERILE (DRAPES) ×1 IMPLANT
DRAPE U-SHAPE 47X51 STRL (DRAPES) ×1 IMPLANT
DRESSING AQUACEL AG SP 3.5X6 (GAUZE/BANDAGES/DRESSINGS) ×1 IMPLANT
DRILL SURG AR 10 (DRILL) IMPLANT
DRSG AQUACEL AG ADV 3.5X10 (GAUZE/BANDAGES/DRESSINGS) IMPLANT
DRSG AQUACEL AG SP 3.5X6 (GAUZE/BANDAGES/DRESSINGS) ×1
DURAPREP 26ML APPLICATOR (WOUND CARE) ×1 IMPLANT
ELECT BLADE TIP CTD 4 INCH (ELECTRODE) ×1 IMPLANT
ELECT PENCIL ROCKER SW 15FT (MISCELLANEOUS) ×1 IMPLANT
ELECT REM PT RETURN 15FT ADLT (MISCELLANEOUS) ×1 IMPLANT
FACESHIELD WRAPAROUND (MASK) ×5 IMPLANT
FACESHIELD WRAPAROUND OR TEAM (MASK) ×5 IMPLANT
GLENOID UNI REV MOD 24 +2 LAT (Joint) IMPLANT
GLENOSPHERE 39+4 LAT/24 UNI RV (Joint) IMPLANT
GLOVE BIO SURGEON STRL SZ7.5 (GLOVE) ×1 IMPLANT
GLOVE BIO SURGEON STRL SZ8 (GLOVE) ×1 IMPLANT
GLOVE SS BIOGEL STRL SZ 7 (GLOVE) ×1 IMPLANT
GLOVE SS BIOGEL STRL SZ 7.5 (GLOVE) ×1 IMPLANT
GOWN STRL SURGICAL XL XLNG (GOWN DISPOSABLE) ×2 IMPLANT
INSERT HUMERAL 39/+6 (Insert) IMPLANT
KIT BASIN OR (CUSTOM PROCEDURE TRAY) ×1 IMPLANT
KIT TURNOVER KIT A (KITS) IMPLANT
MANIFOLD NEPTUNE II (INSTRUMENTS) ×1 IMPLANT
NDL TAPERED W/ NITINOL LOOP (MISCELLANEOUS) ×1 IMPLANT
NEEDLE TAPERED W/ NITINOL LOOP (MISCELLANEOUS) ×1 IMPLANT
NS IRRIG 1000ML POUR BTL (IV SOLUTION) ×1 IMPLANT
PACK SHOULDER (CUSTOM PROCEDURE TRAY) ×1 IMPLANT
PAD ARMBOARD 7.5X6 YLW CONV (MISCELLANEOUS) ×1 IMPLANT
PAD COLD SHLDR WRAP-ON (PAD) ×1 IMPLANT
PIN NITINOL TARGETER 2.8 (PIN) IMPLANT
PIN SET MODULAR GLENOID SYSTEM (PIN) IMPLANT
RESTRAINT HEAD UNIVERSAL NS (MISCELLANEOUS) ×1 IMPLANT
SCREW CENTRAL MOD 30MM (Screw) IMPLANT
SCREW PERI LOCK 5.5X16 (Screw) IMPLANT
SCREW PERI LOCK 5.5X32 (Screw) IMPLANT
SCREW PERIPHERAL 5.5X20 LOCK (Screw) IMPLANT
SCREW PERIPHERAL 5.5X28 LOCK (Screw) IMPLANT
SLING ARM FOAM STRAP LRG (SOFTGOODS) IMPLANT
SLING ARM FOAM STRAP MED (SOFTGOODS) IMPLANT
STEM HUM UNIV REV SZ11 (Stem) IMPLANT
SUT MNCRL AB 3-0 PS2 18 (SUTURE) ×1 IMPLANT
SUT MON AB 2-0 CT1 36 (SUTURE) ×1 IMPLANT
SUT VIC AB 1 CT1 36 (SUTURE) ×1 IMPLANT
SUTURE TAPE 1.3 40 TPR END (SUTURE) ×2 IMPLANT
SUTURETAPE 1.3 40 TPR END (SUTURE) ×2
TOWEL OR 17X26 10 PK STRL BLUE (TOWEL DISPOSABLE) ×1 IMPLANT
TOWEL OR NON WOVEN STRL DISP B (DISPOSABLE) ×1 IMPLANT
TUBE SUCTION HIGH CAP CLEAR NV (SUCTIONS) ×1 IMPLANT
TUBING CONNECTING 10 (TUBING) IMPLANT
WATER STERILE IRR 1000ML POUR (IV SOLUTION) ×2 IMPLANT

## 2022-08-19 NOTE — Transfer of Care (Signed)
Immediate Anesthesia Transfer of Care Note  Patient: Joseph [name]  Procedure(s) Performed: REVERSE SHOULDER ARTHROPLASTY (Right: Shoulder)  Patient Location: PACU  Anesthesia Type:General  Level of Consciousness: awake, drowsy, and patient cooperative  Airway & Oxygen Therapy: Patient Spontanous Breathing and Patient connected to face mask oxygen  Post-op Assessment: Report given to RN and Post -op Vital signs reviewed and stable  Post vital signs: Reviewed and stable  Last Vitals:  Vitals Value Taken Time  BP 130/79 08/19/22 1145  Temp    Pulse 78 08/19/22 1148  Resp 14 08/19/22 1148  SpO2 100 % 08/19/22 1148  Vitals shown include unvalidated device data.  Last Pain:  Vitals:   08/19/22 0930  TempSrc:   PainSc: 0-No pain         Complications: No notable events documented.

## 2022-08-19 NOTE — H&P (Signed)
Joseph [name]    Chief Complaint: Right shoulder rotator cuff tear arthropathy HPI: The patient is a 87 y.o. male with chronic and progressively increasing right shoulder pain related to his severe glenohumeral arthritis and associated rotator cuff degeneration and dysfunction.  Due to his increasing functional imitations and failure to respond to prolonged attempts at conservative management, he is brought to the operating room at this time for planned right shoulder reverse arthroplasty  Past Medical History:  Diagnosis Date   Arthritis    Atrial fibrillation ([place])    resolved with cardioversion   Complication of anesthesia    Dysrhythmia    Atrial Fibrilation   GERD (gastroesophageal reflux disease)    Headache    occasional   History of skin cancer    Pneumonia    PONV (postoperative nausea and vomiting)    Prostate cancer ([place])    Sigmoid diverticulitis April of 2012   with small perforation   Squamous cell carcinoma       Past Surgical History:  Procedure Laterality Date   CARDIOVERSION  01/07/2009   LASIK     LUMBAR LAMINECTOMY/DECOMPRESSION MICRODISCECTOMY N/A 05/06/2021   Procedure: Lumbar decompression and insitu fusion Lumbar four - Lumbar five;  Surgeon: Melina Schools, MD;  Location: [facility];  Service: Orthopedics;  Laterality: N/A;  3 hrs 3 C-Bed   LUMBAR LAMINECTOMY/DECOMPRESSION MICRODISCECTOMY N/A 03/08/2022   Procedure: THORACIC TWELVE TO LUMBAR FIVE DECOMPRESSION;  Surgeon: Melina Schools, MD;  Location: [facility];  Service: Orthopedics;  Laterality: N/A;  2hr 3 C-Bed   LYMPHADENECTOMY Bilateral 01/17/2015   Procedure: PELVIC LYMPHADENECTOMY;  Surgeon: Alexis Frock, MD;  Location: [facility];  Service: Urology;  Laterality: Bilateral;   NASAL SEPTUM SURGERY     ROBOT ASSISTED LAPAROSCOPIC RADICAL PROSTATECTOMY N/A 01/17/2015   Procedure: ROBOTIC ASSISTED LAPAROSCOPIC RADICAL PROSTATECTOMY WITH INDOCYANINE GREEN DYE INJECTION;  Surgeon: Alexis Frock, MD;  Location: [facility];  Service: Urology;  Laterality: N/A;   SHOULDER SURGERY     x 2   TONSILLECTOMY     as a child    Family History  Problem Relation Age of Onset   Other Mother        no neg hx   Other Father        no neg hx    Social History:  reports that he has never smoked. He has never used smokeless tobacco. He reports current alcohol use. He reports that he does not use drugs.  BMI: Estimated body mass index is 25.91 kg/m as calculated from the following:   Height as of 08/03/22: 5' 6.5" (1.689 m).   Weight as of this encounter: 73.9 kg.  Lab Results  Component Value Date   ALBUMIN 3.1 (L) 03/11/2022   Diabetes: Patient does not have a diagnosis of diabetes.     Smoking Status:   reports that he has never smoked. He has never used smokeless tobacco.     Medications Prior to Admission  Medication Sig Dispense Refill   acetaminophen (TYLENOL) 650 MG CR tablet Take 1,300 mg by mouth every 8 (eight) hours as needed for pain.     apixaban (ELIQUIS) 2.5 MG TABS tablet Take 1 tablet (2.5 mg total) by mouth 2 (two) times daily. 180 tablet 3   Cyanocobalamin (B-12 PO) Take 1 tablet by mouth daily.     diltiazem (CARDIZEM) 30 MG tablet Take 1 tablet (30 mg total) by mouth every 12 (twelve) hours. 180 tablet 3   OVER  THE COUNTER MEDICATION Apply 1 Application topically daily as needed (pain). Hempvana     polyvinyl alcohol (LIQUIFILM TEARS) 1.4 % ophthalmic solution Place 1 drop into both eyes as needed for dry eyes.     TURMERIC PO Take 1 capsule by mouth daily.       Physical Exam: Right shoulder demonstrates painful and guarded motion is noted at his recent office visits.  He has profoundly restricted mobility with globally decreased strength.  He is otherwise neurovascular intact in the right upper extremity.  Radiographs  Plain films confirm severe osteoarthritis with complete obliteration of the joint space, subchondral sclerosis, and peripheral osteophyte  formation.  Recent MRI scan confirms severe rotator cuff thinning and degeneration  Vitals  Temp:  [97.6 F (36.4 C)] 97.6 F (36.4 C) (01/11 0807) Pulse Rate:  [85] 85 (01/11 0807) Resp:  [16] 16 (01/11 0807) BP: (141)/(84) 141/84 (01/11 0807) SpO2:  [96 %] 96 % (01/11 0807) Weight:  [73.9 kg] 73.9 kg (01/11 0820)  Assessment/Plan  Impression: Right shoulder rotator cuff tear arthropathy  Plan of Action: Procedure(s): REVERSE SHOULDER ARTHROPLASTY  Joseph [name] 08/19/2022, 9:17 AM Contact # [phone]

## 2022-08-19 NOTE — Op Note (Signed)
08/19/2022  11:35 AM  PATIENT:   Joseph [name]  87 y.o. male  PRE-OPERATIVE DIAGNOSIS:  Right shoulder rotator cuff tear arthropathy  POST-OPERATIVE DIAGNOSIS: Same  PROCEDURE: Right shoulder reverse arthroplasty utilizing a press-fit size 11 Arthrex stem with a neutral metaphysis, +6 polyethylene insert, 39/+4 glenosphere and a small/+2 baseplate  SURGEON:  Josie Burleigh, Metta Clines M.D.  ASSISTANTS: Jenetta Loges, PA-C  Jenetta Loges, PA-C was utilized as an Environmental consultant throughout this case, essential for help with positioning the patient, positioning extremity, tissue manipulation, implantation of the prosthesis, suture management, wound closure, and intraoperative decision-making.  ANESTHESIA:   General endotracheal and interscalene block with Exparel  EBL: 150 cc  SPECIMEN: None  Drains: None   PATIENT DISPOSITION:  PACU - hemodynamically stable.    PLAN OF CARE: Discharge to home after PACU  Brief history:  Joseph [name] is an 87 year old gentleman who has had chronic and progressively increasing right shoulder pain related to severe rotator cuff tear arthropathy.  Due to his increasing functional limitations and failure to respond to prolonged attempts at conservative management, he is brought to the operating room at this time for planned right shoulder reverse arthroplasty.  Preoperatively, I counseled the patient regarding treatment options and risks versus benefits thereof.  Possible surgical complications were all reviewed including potential for bleeding, infection, neurovascular injury, persistent pain, loss of motion, anesthetic complication, failure of the implant, and possible need for additional surgery. They understand and accept and agrees with our planned procedure.   Procedure detail:  After undergoing routine preop evaluation the patient received prophylactic antibiotics and interscalene block with Exparel was established in the holding area by the anesthesia  department.  Subsequently placed supine on the operating table and underwent the smooth induction of a general endotracheal anesthesia.  Placed in the beachchair position and appropriately padded and protected.  The right shoulder girdle region was sterilely prepped and draped in standard fashion.  Timeout was called.  Deltopectoral approach to the right shoulder was made through an approximately 8 cm incision.  Skin flaps elevated dissection carried deeply the deltopectoral interval was then developed proximal to this with the vein taken laterally.  The conjoined tendon was mobilized and retracted medially in the upper centimeter and a half of the pectoralis major was tenotomized for exposure.  Adhesions divided beneath the deltoid and the long head biceps tendon was then tenodesed at the upper border the pectoralis major tendon with the proximal segment unroofed and excised.  The rotator cuff was then split from the apex of the bicipital groove to the base of the coracoid and the subscapularis was then separated from the lesser tuberosity using electrocautery the free margin was tagged with a pair of suture tape sutures.  Capsular attachments were then divided the anterior and inferior margins of the humeral neck and the humeral head was then delivered through the wound.  Extra medullary guide was used to outline the proposed humeral head resection which we performed with an oscillating saw at approximately 20 degrees of retroversion.  A rondure was then used to remove the large marginal osteophytes.  A metal cap was then placed over the cut proximal humeral surface and the glenoid was then exposed and a circumferential labral resection was performed.  A guidepin was then directed into the center of the glenoid and the glenoid was then reamed with the central followed by the peripheral reamer to a stable subchondral bony bed.  Preparation completed with a drill and tap for a  30 mm lag screw.  Our baseplate was  then assembled and the baseplate was inserted with vancomycin powder applied to the threads of the lag screw and excellent purchase and fixation was achieved.  A 39/+4 glenosphere was then impacted onto the baseplate and the central locking screw was placed.  Attention returned back to the humeral metaphysis where the canal was opened and we broached up to a size 11 at 20 degrees of retroversion.  A neutral metaphyseal reaming guide was then used to prepare the metaphysis.  A trial implant was then placed and this showed good motion good stability and good soft tissue balance.  The trial was then removed.  Our final implant was assembled.  He was inserted after vancomycin powder was sprayed liberally into the humeral canal.  The implant was then seated with excellent purchase and fixation.  A series of trial reduction was then performed and ultimately felt that the +6 poly gave [place] the best motion stability and soft tissue balance.  The trial implant was then removed.  The final poly was then impacted after the implant was cleaned and dried.  The final reduction showed excellent motion stability and soft tissue balance all much to our satisfaction.  We then confirmed the subscapularis is having good elasticity and it was then repaired back to the eyelets on the collar of the implant using the previously placed suture tape sutures.  Final irrigation was completed.  Hemostasis was obtained.  Balance of the vancomycin powder was then sprayed liberally throughout the deep soft tissue planes.  The deltopectoral interval was reapproximated with a series of figure-of-eight and 1 Vicryl sutures.  2-0 Monocryl used to close the subcu layer and intracuticular 3-0 Monocryl used to close the skin followed by Dermabond and Aquacel dressing.  The right arm was placed into a sling and the patient was awakened, extubated, and taken to the recovery room in stable condition.  Marin Shutter MD   Contact # [phone]

## 2022-08-19 NOTE — Anesthesia Procedure Notes (Signed)
Procedure Name: Intubation Date/Time: 08/19/2022 10:10 AM  Performed by: Eben Burow, CRNAPre-anesthesia Checklist: Patient identified, Emergency Drugs available, Suction available, Patient being monitored and Timeout performed Patient Re-evaluated:Patient Re-evaluated prior to induction Oxygen Delivery Method: Circle system utilized Preoxygenation: Pre-oxygenation with 100% oxygen Induction Type: IV induction Ventilation: Mask ventilation without difficulty Laryngoscope Size: Mac and 4 Grade View: Grade II Tube type: Oral Tube size: 7.5 mm Number of attempts: 1 Airway Equipment and Method: Stylet Placement Confirmation: ETT inserted through vocal cords under direct vision, positive ETCO2 and breath sounds checked- equal and bilateral Secured at: 22 cm Tube secured with: Tape Dental Injury: Teeth and Oropharynx as per pre-operative assessment  Comments: Intubation by Sheron Nightingale, SRNA

## 2022-08-19 NOTE — Anesthesia Postprocedure Evaluation (Signed)
Anesthesia Post Note  Patient: Joseph [name]  Procedure(s) Performed: REVERSE SHOULDER ARTHROPLASTY (Right: Shoulder)     Patient location during evaluation: PACU Anesthesia Type: General Level of consciousness: awake and alert Pain management: pain level controlled Vital Signs Assessment: post-procedure vital signs reviewed and stable Respiratory status: spontaneous breathing, nonlabored ventilation, respiratory function stable and patient connected to nasal cannula oxygen Cardiovascular status: blood pressure returned to baseline and stable Postop Assessment: no apparent nausea or vomiting Anesthetic complications: no  No notable events documented.  Last Vitals:  Vitals:   08/19/22 1430 08/19/22 1535  BP: 121/81 (!) 139/90  Pulse: 92 87  Resp:  18  Temp:  36.4 C  SpO2: 93% 95%    Last Pain:  Vitals:   08/19/22 1535  TempSrc: Oral  PainSc: 0-No pain                 Tiajuana Amass

## 2022-08-19 NOTE — Anesthesia Procedure Notes (Signed)
Anesthesia Regional Block: Interscalene brachial plexus block   Pre-Anesthetic Checklist: , timeout performed,  Correct Patient, Correct Site, Correct Laterality,  Correct Procedure, Correct Position, site marked,  Risks and benefits discussed,  Surgical consent,  Pre-op evaluation,  At surgeon's request and post-op pain management  Laterality: Right  Prep: chloraprep       Needles:  Injection technique: Single-shot  Needle Type: Echogenic Stimulator Needle     Needle Length: 9cm  Needle Gauge: 21     Additional Needles:   Procedures:, nerve stimulator,,, ultrasound used (permanent image in chart),,     Nerve Stimulator or Paresthesia:  Response: deltoid, 0.5 mA  Additional Responses:   Narrative:  Start time: 08/19/2022 9:20 AM End time: 08/19/2022 9:27 AM Injection made incrementally with aspirations every 5 mL.  Performed by: Personally  Anesthesiologist: Suzette Battiest, MD

## 2022-08-19 NOTE — Discharge Instructions (Signed)

## 2022-08-19 NOTE — Evaluation (Signed)
Occupational Therapy Evaluation Patient Details Name: Joseph [name] MRN: [id number] DOB: 1936-01-07 Today's Date: 08/19/2022   History of Present Illness Joseph [name] is a 87 yr old male who is s/p a R shoulder reverse arthroplasty on 08-19-2021, secondary rotator cuff tear arthropathy.   Clinical Impression   Pt is s/p shoulder replacement without functional use of right dominant upper extremity secondary to effects of surgery, interscalene block,  and shoulder precautions. [name] with regards to ROM, exercises, post-op precautions, UE positioning, donning upper extremity clothing, recommendations for bathing while maintaining shoulder precautions, use of ice for pain and edema management and donning/doffing sling. Patient and his spouse presented with fair understanding, recall, and teach back abilities. They needed consistent reinforcement and re-education throughout. The pt will benefit from further OT services in the hospital setting, as well as when he discharges from the hospital, in order to provide further reinforcement and education, and to ensure appropriate carryover of knowledge and skills taught. Him and his spouse reside at [facility] and have the option to receive therapy services there, with this OT recommending the pt consider a short-term inpatient rehab stay vs in home therapy there; he stated agreement with this and indicated he would follow-up with the facility to set-up such services. OT notified pt's in house PA of recommendations. Patient needed assistance to donn shirt, underwear, pants, socks and shoes and provided with instruction provided on compensatory strategies to perform ADLs. Patient to follow up with MD for further therapy needs.        Recommendations for follow up therapy are one component of a multi-disciplinary discharge planning process, led by the attending physician.  Recommendations may  be updated based on patient status, additional functional criteria and insurance authorization.   Follow Up Recommendations  Follow physician's recommendations for discharge plan and follow up therapies     Assistance Recommended at Discharge Frequent or constant Supervision/Assistance  Patient can return home with the following A lot of help with bathing/dressing/bathroom;Direct supervision/assist for medications management;Assist for transportation;A little help with walking and/or transfers    Functional Status Assessment  Patient has had a recent decline in their functional status and demonstrates the ability to make significant improvements in function in a reasonable and predictable amount of time.  Equipment Recommendations  None recommended by OT       Precautions / Restrictions Precautions Precautions: Shoulder Shoulder Interventions: Shoulder sling/immobilizer Precaution Booklet Issued: Yes (comment) Required Braces or Orthoses: Sling Restrictions Weight Bearing Restrictions: Yes RUE Weight Bearing: Non weight bearing Other Position/Activity Restrictions: If sitting in controlled environment, ok to come out of sling to give neck a break. Please sleep in it to protect until follow up in office.    OK to use operative arm for feeding, hygiene and ADLs.  Ok to instruct Pendulums and lap slides as exercises. Ok to use operative arm within the following parameters for ADL purposes    New ROM (8/18)  Ok for PROM, AAROM, AROM within pain tolerance and within the following ROM  ER 20  ABD 45  FE 60      Please instruct Ice machine usage      Mobility Bed Mobility    General bed mobility comments: pt was received seated in the bedside chair    Transfers Overall transfer level: Needs assistance   Transfers: Sit to/from Stand Sit to Stand: Supervision  ADL either performed or assessed with clinical judgement                   Pertinent  Vitals/Pain Pain Assessment Pain Assessment: No/denies pain     Hand Dominance Right      Communication Communication Communication: No difficulties   Cognition Arousal/Alertness: Awake/alert Behavior During Therapy: WFL for tasks assessed/performed Overall Cognitive Status: Within Functional Limits for tasks assessed        General Comments: Oriented x4, able to follow 1-2 step commands without difficulty           Shoulder Instructions Shoulder Instructions Donning/doffing shirt without moving shoulder: Moderate assistance Method for sponge bathing under operated UE: Moderate assistance Donning/doffing sling/immobilizer: Moderate assistance Correct positioning of sling/immobilizer: Moderate assistance Pendulum exercises (written home exercise program): Moderate assistance ROM for elbow, wrist and digits of operated UE: Minimal assistance Sling wearing schedule (on at all times/off for ADL's): Moderate assistance Proper positioning of operated UE when showering: Moderate assistance Positioning of UE while sleeping: Moderate assistance    Home Living Family/patient expects to be discharged to:: Private residence Living Arrangements: Spouse/significant other Available Help at Discharge: Family Type of Home:  (Joseph [name]) Home Access: Level entry     Home Layout: One level     Bathroom Shower/Tub: Chief Strategy Officer: Grab bars - tub/shower;Shower seat - built in   Additional Comments: [facility] living facility      Prior Functioning/Environment Prior Level of Function : Independent/Modified Independent             Mobility Comments: He was independent with ambulation. ADLs Comments: He was independent with ADLs and short distance driving. Meals and cleaning are managed by the staff there.        OT Problem List: Decreased strength;Decreased range of motion;Impaired balance (sitting and/or  standing);Decreased knowledge of precautions;Impaired sensation      OT Treatment/Interventions: Self-care/ADL training;Therapeutic exercise;Therapeutic activities;Energy conservation;Patient/family education;DME and/or AE instruction;Balance training    OT Goals(Current goals can be found in the care plan section) Acute Rehab OT Goals OT Goal Formulation: With patient/family Time For Goal Achievement: 09/02/22 Potential to Achieve Goals: Good ADL Goals Pt Will Perform Upper Body Dressing: with supervision;sitting Pt Will Transfer to Toilet: with supervision Pt Will Perform Toileting - Clothing Manipulation and hygiene: with supervision  OT Frequency: Min 2X/week       AM-PAC OT "6 Clicks" Daily Activity     Outcome Measure Help from another person eating meals?: None Help from another person taking care of personal grooming?: A Little Help from another person toileting, which includes using toliet, bedpan, or urinal?: A Little Help from another person bathing (including washing, rinsing, drying)?: A Lot Help from another person to put on and taking off regular upper body clothing?: A Lot Help from another person to put on and taking off regular lower body clothing?: A Lot 6 Click Score: 16   End of Session Nurse Communication: Other (comment) (Shoulder instruction completed)  Activity Tolerance: Patient tolerated treatment well Patient left: in chair;with call bell/phone within reach;with family/visitor present  OT Visit Diagnosis: Muscle weakness (generalized) (M62.81)                Time: 4825-0037 OT Time Calculation (min): 39 min Charges:  OT General Charges $OT Visit: 1 Visit OT Evaluation $OT Eval Moderate Complexity: 1 Mod OT Treatments $Self Care/Home Management : 8-22 mins    Margean Korell L  Ronnald Ramp, OTR/L 08/19/2022, 4:06 PM

## 2022-08-20 ENCOUNTER — Encounter (HOSPITAL_COMMUNITY): Payer: Self-pay | Admitting: Orthopedic Surgery

## 2022-08-27 DIAGNOSIS — N393 Stress incontinence (female) (male): Secondary | ICD-10-CM | POA: Diagnosis not present

## 2022-08-27 DIAGNOSIS — Z8546 Personal history of malignant neoplasm of prostate: Secondary | ICD-10-CM | POA: Diagnosis not present

## 2022-08-30 DIAGNOSIS — Z96611 Presence of right artificial shoulder joint: Secondary | ICD-10-CM | POA: Diagnosis not present

## 2022-08-30 DIAGNOSIS — Z5189 Encounter for other specified aftercare: Secondary | ICD-10-CM | POA: Diagnosis not present

## 2022-09-01 DIAGNOSIS — Z23 Encounter for immunization: Secondary | ICD-10-CM | POA: Diagnosis not present

## 2022-09-23 DIAGNOSIS — H2512 Age-related nuclear cataract, left eye: Secondary | ICD-10-CM | POA: Diagnosis not present

## 2022-09-27 DIAGNOSIS — Z5189 Encounter for other specified aftercare: Secondary | ICD-10-CM | POA: Diagnosis not present

## 2022-09-27 DIAGNOSIS — M6281 Muscle weakness (generalized): Secondary | ICD-10-CM | POA: Diagnosis not present

## 2022-09-27 DIAGNOSIS — M25611 Stiffness of right shoulder, not elsewhere classified: Secondary | ICD-10-CM | POA: Diagnosis not present

## 2022-09-28 DIAGNOSIS — R413 Other amnesia: Secondary | ICD-10-CM | POA: Diagnosis not present

## 2022-10-03 ENCOUNTER — Encounter: Payer: Self-pay | Admitting: Cardiology

## 2022-10-03 DIAGNOSIS — I4821 Permanent atrial fibrillation: Secondary | ICD-10-CM | POA: Insufficient documentation

## 2022-10-03 NOTE — Progress Notes (Unsigned)
Cardiology Office Note   Date:  10/04/2022   ID:  Joseph [name], DOB 03/02/1936, MRN [id number]  PCP:  [name], MD  Cardiologist:   [name], MD    Chief Complaint  Patient presents with   Atrial Fibrillation     History of Present Illness: Joseph [name] is a 87 y.o. male who presents for follow up of atrial fib.  He had cardioversion in 2012 when he developed atrial fibrillation at the time of ruptured diverticulum. However, he had another recurrence of this in 2017.    He has been in chronic atrial fib.   We saw him in December 2023 prior to shoulder arthroplasty.    He is starting PT and so has not been as active since his shoulder surgery in January.  Of note he is on a lower dose of Eliquis because he was in the emergency room in July with a spontaneous epidural hematoma and he has been Eliquis was held.  He was eventually started back at a lower dose.  The patient denies any new symptoms such as chest discomfort, neck or arm discomfort. There has been no new shortness of breath, PND or orthopnea. There have been no reported palpitations, presyncope or syncope.     Past Medical History:  Diagnosis Date   Arthritis    Atrial fibrillation ([place])    resolved with cardioversion   Complication of anesthesia    Dysrhythmia    Atrial Fibrilation   GERD (gastroesophageal reflux disease)    History of skin cancer    PONV (postoperative nausea and vomiting)    Prostate cancer ([place])    Sigmoid diverticulitis April of 2012   with small perforation   Squamous cell carcinoma     Past Surgical History:  Procedure Laterality Date   CARDIOVERSION  01/07/2009   LASIK     LUMBAR LAMINECTOMY/DECOMPRESSION MICRODISCECTOMY N/A 05/06/2021   Procedure: Lumbar decompression and insitu fusion Lumbar four - Lumbar five;  Surgeon: Melina Schools, MD;  Location: [place];  Service: Orthopedics;  Laterality: N/A;  3 hrs 3 C-Bed   LUMBAR LAMINECTOMY/DECOMPRESSION MICRODISCECTOMY N/A  03/08/2022   Procedure: THORACIC TWELVE TO LUMBAR FIVE DECOMPRESSION;  Surgeon: Melina Schools, MD;  Location: [place];  Service: Orthopedics;  Laterality: N/A;  2hr 3 C-Bed   LYMPHADENECTOMY Bilateral 01/17/2015   Procedure: PELVIC LYMPHADENECTOMY;  Surgeon: Alexis Frock, MD;  Location: [facility];  Service: Urology;  Laterality: Bilateral;   NASAL SEPTUM SURGERY     REVERSE SHOULDER ARTHROPLASTY Right 08/19/2022   Procedure: REVERSE SHOULDER ARTHROPLASTY;  Surgeon: Justice Britain, MD;  Location: [facility];  Service: Orthopedics;  Laterality: Right;  163mn   ROBOT ASSISTED LAPAROSCOPIC RADICAL PROSTATECTOMY N/A 01/17/2015   Procedure: ROBOTIC ASSISTED LAPAROSCOPIC RADICAL PROSTATECTOMY WITH INDOCYANINE GREEN DYE INJECTION;  Surgeon: TAlexis Frock MD;  Location: [facility];  Service: Urology;  Laterality: N/A;   SHOULDER SURGERY     x 2   TONSILLECTOMY     as a child     Current Outpatient Medications  Medication Sig Dispense Refill   acetaminophen (TYLENOL) 650 MG CR tablet Take 1,300 mg by mouth every 8 (eight) hours as needed for pain.     apixaban (ELIQUIS) 2.5 MG TABS tablet Take 1 tablet (2.5 mg total) by mouth 2 (two) times daily. 180 tablet 3   Cyanocobalamin (B-12 PO) Take 1 tablet by mouth daily.     diltiazem (CARDIZEM) 30 MG tablet Take 1 tablet (30 mg total)  by mouth every 12 (twelve) hours. 180 tablet 3   OVER THE COUNTER MEDICATION Apply 1 Application topically daily as needed (pain). Hempvana     polyvinyl alcohol (LIQUIFILM TEARS) 1.4 % ophthalmic solution Place 1 drop into both eyes as needed for dry eyes.     prednisoLONE sodium phosphate (INFLAMASE FORTE) 1 % ophthalmic solution Place 1 drop into the left eye 4 (four) times daily. Use a directed     TURMERIC PO Take 1 capsule by mouth daily.     No current facility-administered medications for this visit.    Allergies:   Poison ivy extract and Tetracycline hcl    ROS:  Please see the history of present illness.   Otherwise,  review of systems are positive for none.   All other systems are reviewed and negative.    PHYSICAL EXAM: VS:  BP 128/70   Pulse 96   Ht '5\' 6"'$  (1.676 m)   Wt 160 lb (72.6 kg)   SpO2 97%   BMI 25.82 kg/m  , BMI Body mass index is 25.82 kg/m.  GENERAL:  Well appearing NECK:  No jugular venous distention, waveform within normal limits, carotid upstroke brisk and symmetric, no bruits, no thyromegaly LUNGS:  Clear to auscultation bilaterally CHEST:  Unremarkable HEART:  PMI not displaced or sustained,S1 and S2 within normal limits, no S3, no clicks, no rubs, no murmurs, irregular  ABD:  Flat, positive bowel sounds normal in frequency in pitch, no bruits, no rebound, no guarding, no midline pulsatile mass, no hepatomegaly, no splenomegaly EXT:  2 plus pulses throughout, no edema, no cyanosis no clubbing   EKG:  EKG is not ordered today.    Recent Labs: 12/31/2021: TSH 1.419 01/02/2022: Magnesium 2.0 03/11/2022: ALT 12 08/03/2022: BUN 17; Creatinine, Ser 1.38; Hemoglobin 15.5; Platelets 246; Potassium 4.1; Sodium 139    Lipid Panel No results found for: "CHOL", "TRIG", "HDL", "CHOLHDL", "VLDL", "LDLCALC", "LDLDIRECT"    Wt Readings from Last 3 Encounters:  10/04/22 160 lb (72.6 kg)  08/19/22 163 lb (73.9 kg)  08/03/22 163 lb (73.9 kg)      Other studies Reviewed: Additional studies/ records that were reviewed today include: ED records, primary care labs Review of the above records demonstrates: See elsewhere  ASSESSMENT AND PLAN:   ATRIAL FIB:     He tolerates anticoagulation. . Mr. Joseph [name] has a CHA2DS2 - VASc score of 2 .   He is not on a therapeutic dose of Eliquis based on his weight and renal function.  I did review the MRI results again from July of last year.  There was a heterogeneous epidural to intradural collection involving the central and left dorsal thecal sac extending from T12-L2 3 but could reflect evolving subacute hematoma.  The ER doctor did discuss it  with the neurosurgeon on-call Dr. Kathyrn Sheriff.  And it was suggested at that time that they hold the Eliquis.  At some point it was restarted at a lower dose.  I have asked the patient and his wife to go back to the neurosurgeon to find out if he would be cleared to resume the therapeutic dose.  Also he is to get a pulse oximeter to follow his heart rate.   Current medicines are reviewed at length with the patient today.  The patient does not have concerns regarding medicines.  The following changes have been made:  None  Labs/ tests ordered today include:    None  No orders of the defined  types were placed in this encounter.    Disposition:   FU with me in one year.    Signed, [name], MD  10/04/2022 9:07 AM    [facility]

## 2022-10-04 ENCOUNTER — Encounter: Payer: Self-pay | Admitting: Cardiology

## 2022-10-04 ENCOUNTER — Ambulatory Visit: Payer: Medicare PPO | Attending: Cardiology | Admitting: Cardiology

## 2022-10-04 VITALS — BP 128/70 | HR 96 | Ht 66.0 in | Wt 160.0 lb

## 2022-10-04 DIAGNOSIS — Z961 Presence of intraocular lens: Secondary | ICD-10-CM | POA: Diagnosis not present

## 2022-10-04 DIAGNOSIS — H2512 Age-related nuclear cataract, left eye: Secondary | ICD-10-CM | POA: Diagnosis not present

## 2022-10-04 DIAGNOSIS — I4821 Permanent atrial fibrillation: Secondary | ICD-10-CM | POA: Diagnosis not present

## 2022-10-04 NOTE — Patient Instructions (Signed)
   Follow-Up: At [facility], you and your health needs are our priority.  As part of our continuing mission to provide you with exceptional heart care, we have created designated Provider Care Teams.  These Care Teams include your primary Cardiologist (physician) and Advanced Practice Providers (APPs -  Physician Assistants and Nurse Practitioners) who all work together to provide you with the care you need, when you need it.  We recommend signing up for the patient portal called "MyChart".  Sign up information is provided on this After Visit Summary.  MyChart is used to connect with patients for Virtual Visits (Telemedicine).  Patients are able to view lab/test results, encounter notes, upcoming appointments, etc.  Non-urgent messages can be sent to your provider as well.   To learn more about what you can do with MyChart, go to [email or web address].    Your next appointment:   12 month(s)  Provider:   James Hochrein, MD      

## 2022-10-06 DIAGNOSIS — Z5189 Encounter for other specified aftercare: Secondary | ICD-10-CM | POA: Diagnosis not present

## 2022-10-06 DIAGNOSIS — M6281 Muscle weakness (generalized): Secondary | ICD-10-CM | POA: Diagnosis not present

## 2022-10-06 DIAGNOSIS — M25611 Stiffness of right shoulder, not elsewhere classified: Secondary | ICD-10-CM | POA: Diagnosis not present

## 2022-10-07 DIAGNOSIS — H2511 Age-related nuclear cataract, right eye: Secondary | ICD-10-CM | POA: Diagnosis not present

## 2022-10-07 DIAGNOSIS — H2512 Age-related nuclear cataract, left eye: Secondary | ICD-10-CM | POA: Diagnosis not present

## 2022-10-11 DIAGNOSIS — M6281 Muscle weakness (generalized): Secondary | ICD-10-CM | POA: Diagnosis not present

## 2022-10-11 DIAGNOSIS — Z5189 Encounter for other specified aftercare: Secondary | ICD-10-CM | POA: Diagnosis not present

## 2022-10-11 DIAGNOSIS — M25611 Stiffness of right shoulder, not elsewhere classified: Secondary | ICD-10-CM | POA: Diagnosis not present

## 2022-10-14 ENCOUNTER — Encounter: Payer: Self-pay | Admitting: Internal Medicine

## 2022-10-14 ENCOUNTER — Ambulatory Visit: Payer: Medicare PPO | Admitting: Internal Medicine

## 2022-10-14 VITALS — BP 124/78 | HR 97 | Temp 97.3°F | Resp 18 | Ht 66.0 in | Wt 162.0 lb

## 2022-10-14 DIAGNOSIS — M6281 Muscle weakness (generalized): Secondary | ICD-10-CM | POA: Diagnosis not present

## 2022-10-14 DIAGNOSIS — H6991 Unspecified Eustachian tube disorder, right ear: Secondary | ICD-10-CM | POA: Insufficient documentation

## 2022-10-14 DIAGNOSIS — M25611 Stiffness of right shoulder, not elsewhere classified: Secondary | ICD-10-CM | POA: Diagnosis not present

## 2022-10-14 DIAGNOSIS — Z5189 Encounter for other specified aftercare: Secondary | ICD-10-CM | POA: Diagnosis not present

## 2022-10-14 MED ORDER — FLUTICASONE PROPIONATE 50 MCG/ACT NA SUSP: 1.0000 | Freq: Two times a day (BID) | NASAL | 0 refills | Status: DC

## 2022-10-14 NOTE — Progress Notes (Signed)
Office Visit  Subjective   Patient ID: Joseph [name]   DOB: [birth year]   Age: 87 y.o.   MRN: [id number]   Chief Complaint Chief Complaint  Patient presents with   office visit    Right ear pain      History of Present Illness Joseph [name] is a 87 yo male who comes in today with complaints of right ear pain that has been going on for years.  He states this is a dull aching pain that is intermittent and he wonders if it is related to ear wax.  He states he has had tinnitus for years but denies any worsening or acute hearing loss.  He does have problems with seasonal allergies with sinus congestion and rhinorrhea but no fevers, chills, rash, SOB, wheezing or other problems.     Past Medical History Past Medical History:  Diagnosis Date   Arthritis    Atrial fibrillation ([place])    resolved with cardioversion   Complication of anesthesia    Dysrhythmia    Atrial Fibrilation   GERD (gastroesophageal reflux disease)    History of skin cancer    PONV (postoperative nausea and vomiting)    Prostate cancer ([place])    Sigmoid diverticulitis April of 2012   with small perforation   Squamous cell carcinoma      Allergies Allergies  Allergen Reactions   Poison Ivy Extract Rash   Tetracycline Hcl Rash     Medications  Current Outpatient Medications:    fluticasone (FLONASE) 50 MCG/ACT nasal spray, Place 1 spray into both nostrils in the morning and at bedtime., Disp: 15.8 mL, Rfl: 0   acetaminophen (TYLENOL) 650 MG CR tablet, Take 1,300 mg by mouth every 8 (eight) hours as needed for pain., Disp: , Rfl:    apixaban (ELIQUIS) 2.5 MG TABS tablet, Take 1 tablet (2.5 mg total) by mouth 2 (two) times daily., Disp: 180 tablet, Rfl: 3   Cyanocobalamin (B-12 PO), Take 1 tablet by mouth daily., Disp: , Rfl:    diltiazem (CARDIZEM) 30 MG tablet, Take 1 tablet (30 mg total) by mouth every 12 (twelve) hours., Disp: 180 tablet, Rfl: 3   OVER THE COUNTER MEDICATION, Apply 1 Application topically daily  as needed (pain). Hempvana, Disp: , Rfl:    polyvinyl alcohol (LIQUIFILM TEARS) 1.4 % ophthalmic solution, Place 1 drop into both eyes as needed for dry eyes., Disp: , Rfl:    prednisoLONE sodium phosphate (INFLAMASE FORTE) 1 % ophthalmic solution, Place 1 drop into the left eye 4 (four) times daily. Use a directed, Disp: , Rfl:    TURMERIC PO, Take 1 capsule by mouth daily., Disp: , Rfl:    Review of Systems Review of Systems  Constitutional:  Negative for fever.  Respiratory:  Negative for cough, sputum production, shortness of breath and wheezing.   Gastrointestinal:  Negative for nausea and vomiting.  Musculoskeletal:  Negative for myalgias.  Skin:  Negative for rash.  Neurological:  Negative for dizziness.       Objective:    Vitals BP 124/78 (BP Location: Left Arm, Patient Position: Sitting, Cuff Size: Normal)   Pulse 97   Temp (!) 97.3 F (36.3 C)   Resp 18   Ht '5\' 6"'$  (1.676 m)   Wt 162 lb (73.5 kg)   SpO2 99%   BMI 26.15 kg/m    Physical Examination Physical Exam Constitutional:      Appearance: Normal appearance. He is not ill-appearing.  HENT:  Right Ear: Tympanic membrane, ear canal and external ear normal.     Left Ear: Tympanic membrane, ear canal and external ear normal.     Ears:     Comments: There is old scarring of his right TM but there were no effusions or other ear abnormalities.    Nose: Congestion present. No rhinorrhea.     Mouth/Throat:     Mouth: Mucous membranes are moist.     Pharynx: Oropharynx is clear. No oropharyngeal exudate or posterior oropharyngeal erythema.  Cardiovascular:     Rate and Rhythm: Normal rate and regular rhythm.     Pulses: Normal pulses.     Heart sounds: No murmur heard.    No friction rub. No gallop.  Pulmonary:     Effort: Pulmonary effort is normal. No respiratory distress.     Breath sounds: No wheezing, rhonchi or rales.  Abdominal:     General: Abdomen is flat. Bowel sounds are normal. There is no  distension.     Palpations: Abdomen is soft.     Tenderness: There is no abdominal tenderness.  Musculoskeletal:     Right lower leg: No edema.     Left lower leg: No edema.  Skin:    General: Skin is warm and dry.     Findings: No rash.  Neurological:     Mental Status: He is alert.        Assessment & Plan:   Dysfunction of right eustachian tube He has miminal wax in his ears.  He is complaining of allergic rhinitis and we will try him on a trial of flonase and zyrtec.  If he is not improved in the next 2-3 weeks, we can refer him to ENT.    No follow-ups on file.   Joseph Roger, MD

## 2022-10-14 NOTE — Assessment & Plan Note (Signed)
He has miminal wax in his ears.  He is complaining of allergic rhinitis and we will try him on a trial of flonase and zyrtec.  If he is not improved in the next 2-3 weeks, we can refer him to ENT.

## 2022-10-18 DIAGNOSIS — M25611 Stiffness of right shoulder, not elsewhere classified: Secondary | ICD-10-CM | POA: Diagnosis not present

## 2022-10-18 DIAGNOSIS — Z5189 Encounter for other specified aftercare: Secondary | ICD-10-CM | POA: Diagnosis not present

## 2022-10-18 DIAGNOSIS — M6281 Muscle weakness (generalized): Secondary | ICD-10-CM | POA: Diagnosis not present

## 2022-10-21 DIAGNOSIS — M25611 Stiffness of right shoulder, not elsewhere classified: Secondary | ICD-10-CM | POA: Diagnosis not present

## 2022-10-21 DIAGNOSIS — Z5189 Encounter for other specified aftercare: Secondary | ICD-10-CM | POA: Diagnosis not present

## 2022-10-21 DIAGNOSIS — M6281 Muscle weakness (generalized): Secondary | ICD-10-CM | POA: Diagnosis not present

## 2022-10-25 DIAGNOSIS — M6281 Muscle weakness (generalized): Secondary | ICD-10-CM | POA: Diagnosis not present

## 2022-10-25 DIAGNOSIS — M25611 Stiffness of right shoulder, not elsewhere classified: Secondary | ICD-10-CM | POA: Diagnosis not present

## 2022-10-25 DIAGNOSIS — Z5189 Encounter for other specified aftercare: Secondary | ICD-10-CM | POA: Diagnosis not present

## 2022-10-28 DIAGNOSIS — M25611 Stiffness of right shoulder, not elsewhere classified: Secondary | ICD-10-CM | POA: Diagnosis not present

## 2022-10-28 DIAGNOSIS — M6281 Muscle weakness (generalized): Secondary | ICD-10-CM | POA: Diagnosis not present

## 2022-10-28 DIAGNOSIS — Z5189 Encounter for other specified aftercare: Secondary | ICD-10-CM | POA: Diagnosis not present

## 2022-11-01 DIAGNOSIS — Z5189 Encounter for other specified aftercare: Secondary | ICD-10-CM | POA: Diagnosis not present

## 2022-11-01 DIAGNOSIS — M6281 Muscle weakness (generalized): Secondary | ICD-10-CM | POA: Diagnosis not present

## 2022-11-01 DIAGNOSIS — M25611 Stiffness of right shoulder, not elsewhere classified: Secondary | ICD-10-CM | POA: Diagnosis not present

## 2022-11-04 DIAGNOSIS — Z5189 Encounter for other specified aftercare: Secondary | ICD-10-CM | POA: Diagnosis not present

## 2022-11-04 DIAGNOSIS — M6281 Muscle weakness (generalized): Secondary | ICD-10-CM | POA: Diagnosis not present

## 2022-11-04 DIAGNOSIS — M25611 Stiffness of right shoulder, not elsewhere classified: Secondary | ICD-10-CM | POA: Diagnosis not present

## 2022-11-08 DIAGNOSIS — Z96611 Presence of right artificial shoulder joint: Secondary | ICD-10-CM | POA: Diagnosis not present

## 2022-11-08 DIAGNOSIS — Z471 Aftercare following joint replacement surgery: Secondary | ICD-10-CM | POA: Diagnosis not present

## 2022-11-08 DIAGNOSIS — Z5189 Encounter for other specified aftercare: Secondary | ICD-10-CM | POA: Diagnosis not present

## 2022-11-08 DIAGNOSIS — Z96619 Presence of unspecified artificial shoulder joint: Secondary | ICD-10-CM | POA: Diagnosis not present

## 2022-11-09 DIAGNOSIS — M25611 Stiffness of right shoulder, not elsewhere classified: Secondary | ICD-10-CM | POA: Diagnosis not present

## 2022-11-09 DIAGNOSIS — Z5189 Encounter for other specified aftercare: Secondary | ICD-10-CM | POA: Diagnosis not present

## 2022-11-11 DIAGNOSIS — Z5189 Encounter for other specified aftercare: Secondary | ICD-10-CM | POA: Diagnosis not present

## 2022-11-11 DIAGNOSIS — M25611 Stiffness of right shoulder, not elsewhere classified: Secondary | ICD-10-CM | POA: Diagnosis not present

## 2022-11-16 DIAGNOSIS — Z5189 Encounter for other specified aftercare: Secondary | ICD-10-CM | POA: Diagnosis not present

## 2022-11-16 DIAGNOSIS — M25611 Stiffness of right shoulder, not elsewhere classified: Secondary | ICD-10-CM | POA: Diagnosis not present

## 2022-11-18 DIAGNOSIS — Z5189 Encounter for other specified aftercare: Secondary | ICD-10-CM | POA: Diagnosis not present

## 2022-11-18 DIAGNOSIS — M25611 Stiffness of right shoulder, not elsewhere classified: Secondary | ICD-10-CM | POA: Diagnosis not present

## 2022-11-22 DIAGNOSIS — M25611 Stiffness of right shoulder, not elsewhere classified: Secondary | ICD-10-CM | POA: Diagnosis not present

## 2022-11-22 DIAGNOSIS — Z5189 Encounter for other specified aftercare: Secondary | ICD-10-CM | POA: Diagnosis not present

## 2023-03-16 DIAGNOSIS — H0288A Meibomian gland dysfunction right eye, upper and lower eyelids: Secondary | ICD-10-CM | POA: Diagnosis not present

## 2023-03-16 DIAGNOSIS — H0288B Meibomian gland dysfunction left eye, upper and lower eyelids: Secondary | ICD-10-CM | POA: Diagnosis not present

## 2023-03-16 DIAGNOSIS — Z961 Presence of intraocular lens: Secondary | ICD-10-CM | POA: Diagnosis not present

## 2023-03-16 DIAGNOSIS — H43813 Vitreous degeneration, bilateral: Secondary | ICD-10-CM | POA: Diagnosis not present

## 2023-03-16 DIAGNOSIS — H04123 Dry eye syndrome of bilateral lacrimal glands: Secondary | ICD-10-CM | POA: Diagnosis not present

## 2023-03-16 DIAGNOSIS — H9201 Otalgia, right ear: Secondary | ICD-10-CM | POA: Diagnosis not present

## 2023-03-16 DIAGNOSIS — H11041 Peripheral pterygium, stationary, right eye: Secondary | ICD-10-CM | POA: Diagnosis not present

## 2023-04-04 ENCOUNTER — Other Ambulatory Visit: Payer: Self-pay | Admitting: Cardiology

## 2023-04-25 DIAGNOSIS — B356 Tinea cruris: Secondary | ICD-10-CM | POA: Diagnosis not present

## 2023-04-25 DIAGNOSIS — L570 Actinic keratosis: Secondary | ICD-10-CM | POA: Diagnosis not present

## 2023-05-15 ENCOUNTER — Other Ambulatory Visit: Payer: Self-pay | Admitting: Cardiology

## 2023-05-16 NOTE — Telephone Encounter (Signed)
Prescription refill request for Eliquis received. Indication:afib Last office visit:2/24 Scr:1.38  12/23 Age: 87 Weight:73.5  kg  Prescription refilled

## 2023-05-31 DIAGNOSIS — L820 Inflamed seborrheic keratosis: Secondary | ICD-10-CM | POA: Diagnosis not present

## 2023-05-31 DIAGNOSIS — L905 Scar conditions and fibrosis of skin: Secondary | ICD-10-CM | POA: Diagnosis not present

## 2023-05-31 DIAGNOSIS — L814 Other melanin hyperpigmentation: Secondary | ICD-10-CM | POA: Diagnosis not present

## 2023-05-31 DIAGNOSIS — B356 Tinea cruris: Secondary | ICD-10-CM | POA: Diagnosis not present

## 2023-05-31 DIAGNOSIS — L821 Other seborrheic keratosis: Secondary | ICD-10-CM | POA: Diagnosis not present

## 2023-05-31 DIAGNOSIS — L570 Actinic keratosis: Secondary | ICD-10-CM | POA: Diagnosis not present

## 2023-06-30 ENCOUNTER — Other Ambulatory Visit: Payer: Self-pay | Admitting: Cardiology

## 2023-07-18 DIAGNOSIS — L821 Other seborrheic keratosis: Secondary | ICD-10-CM | POA: Diagnosis not present

## 2023-07-18 DIAGNOSIS — D485 Neoplasm of uncertain behavior of skin: Secondary | ICD-10-CM | POA: Diagnosis not present

## 2023-07-18 DIAGNOSIS — L814 Other melanin hyperpigmentation: Secondary | ICD-10-CM | POA: Diagnosis not present

## 2023-07-18 DIAGNOSIS — L820 Inflamed seborrheic keratosis: Secondary | ICD-10-CM | POA: Diagnosis not present

## 2023-07-18 DIAGNOSIS — L570 Actinic keratosis: Secondary | ICD-10-CM | POA: Diagnosis not present

## 2023-08-15 DIAGNOSIS — Z8546 Personal history of malignant neoplasm of prostate: Secondary | ICD-10-CM | POA: Diagnosis not present

## 2023-08-15 DIAGNOSIS — C61 Malignant neoplasm of prostate: Secondary | ICD-10-CM | POA: Diagnosis not present

## 2023-08-16 ENCOUNTER — Ambulatory Visit: Payer: Medicare PPO | Admitting: Internal Medicine

## 2023-08-16 VITALS — BP 124/78 | HR 86 | Temp 98.0°F | Resp 18 | Ht 66.0 in | Wt 169.0 lb

## 2023-08-16 DIAGNOSIS — H60501 Unspecified acute noninfective otitis externa, right ear: Secondary | ICD-10-CM | POA: Insufficient documentation

## 2023-08-16 DIAGNOSIS — H6120 Impacted cerumen, unspecified ear: Secondary | ICD-10-CM

## 2023-08-16 MED ORDER — CIPROFLOXACIN-DEXAMETHASONE 0.3-0.1 % OT SUSP: 4.0000 [drp] | Freq: Two times a day (BID) | OTIC | 0 refills | Status: DC

## 2023-08-16 NOTE — Progress Notes (Signed)
   Acute Office Visit  Subjective:     Patient ID: Joseph [name], male    DOB: 06-Jun-1936, 88 y.o.   MRN: [id number]  Chief Complaint  Patient presents with   office visit    Patient have pain in his right ear pain irrigation     HPI Patient is in today for his seen by us  in his right ear and says that when he lie down on the right side he started hurting in his ear.  He has recurrent wax in his ears and think that he has buildup wax again and he wanted that to be checked.  He also says that he does get ringing in his years.  Review of Systems  Constitutional: Negative.   HENT:  Positive for ear pain, hearing loss and tinnitus.         Objective:    BP 124/78 (BP Location: Left Arm, Patient Position: Sitting, Cuff Size: Normal)   Pulse 86   Temp 98 F (36.7 C)   Resp 18   Ht 5' 6 (1.676 m)   Wt 169 lb (76.7 kg)   SpO2 95%   BMI 27.28 kg/m    Physical Exam Constitutional:      Appearance: Normal appearance.  HENT:     Left Ear: There is impacted cerumen.     Ears:     Comments: Irritated right ear canal. Neurological:     Mental Status: He is alert.     No results found for any visits on 08/16/23.      Assessment & Plan:   Problem List Items Addressed This Visit       Nervous and Auditory   Acute non-infective otitis externa of right ear - Primary   He has irritated hyperemic right external ear and I will treat for otitis externa.        Other   Wax in ear   Wax was removed from his left ear.     He will follow-up with his family doctor.  No orders of the defined types were placed in this encounter.   No follow-ups on file.  Roetta Dare, MD

## 2023-08-17 DIAGNOSIS — H6120 Impacted cerumen, unspecified ear: Secondary | ICD-10-CM | POA: Insufficient documentation

## 2023-08-17 NOTE — Assessment & Plan Note (Signed)
 Wax was removed from his left ear.

## 2023-08-17 NOTE — Assessment & Plan Note (Signed)
 He has irritated hyperemic right external ear and I will treat for otitis externa.

## 2023-08-22 DIAGNOSIS — L821 Other seborrheic keratosis: Secondary | ICD-10-CM | POA: Diagnosis not present

## 2023-08-22 DIAGNOSIS — L570 Actinic keratosis: Secondary | ICD-10-CM | POA: Diagnosis not present

## 2023-08-22 DIAGNOSIS — L820 Inflamed seborrheic keratosis: Secondary | ICD-10-CM | POA: Diagnosis not present

## 2023-08-22 DIAGNOSIS — L814 Other melanin hyperpigmentation: Secondary | ICD-10-CM | POA: Diagnosis not present

## 2023-08-22 DIAGNOSIS — D492 Neoplasm of unspecified behavior of bone, soft tissue, and skin: Secondary | ICD-10-CM | POA: Diagnosis not present

## 2023-08-22 DIAGNOSIS — L905 Scar conditions and fibrosis of skin: Secondary | ICD-10-CM | POA: Diagnosis not present

## 2023-08-24 DIAGNOSIS — Z8546 Personal history of malignant neoplasm of prostate: Secondary | ICD-10-CM | POA: Diagnosis not present

## 2023-08-24 DIAGNOSIS — N393 Stress incontinence (female) (male): Secondary | ICD-10-CM | POA: Diagnosis not present

## 2023-09-15 DIAGNOSIS — I10 Essential (primary) hypertension: Secondary | ICD-10-CM | POA: Diagnosis not present

## 2023-09-15 DIAGNOSIS — R7303 Prediabetes: Secondary | ICD-10-CM | POA: Diagnosis not present

## 2023-09-21 ENCOUNTER — Other Ambulatory Visit: Payer: Self-pay | Admitting: Cardiology

## 2023-09-21 DIAGNOSIS — I480 Paroxysmal atrial fibrillation: Secondary | ICD-10-CM | POA: Diagnosis not present

## 2023-09-21 DIAGNOSIS — Z7901 Long term (current) use of anticoagulants: Secondary | ICD-10-CM | POA: Diagnosis not present

## 2023-09-21 DIAGNOSIS — D6869 Other thrombophilia: Secondary | ICD-10-CM | POA: Diagnosis not present

## 2023-09-21 DIAGNOSIS — Z0000 Encounter for general adult medical examination without abnormal findings: Secondary | ICD-10-CM | POA: Diagnosis not present

## 2023-09-21 DIAGNOSIS — K219 Gastro-esophageal reflux disease without esophagitis: Secondary | ICD-10-CM | POA: Diagnosis not present

## 2023-09-21 DIAGNOSIS — R32 Unspecified urinary incontinence: Secondary | ICD-10-CM | POA: Diagnosis not present

## 2023-09-21 DIAGNOSIS — Z8546 Personal history of malignant neoplasm of prostate: Secondary | ICD-10-CM | POA: Diagnosis not present

## 2023-09-21 DIAGNOSIS — I119 Hypertensive heart disease without heart failure: Secondary | ICD-10-CM | POA: Diagnosis not present

## 2023-10-10 ENCOUNTER — Other Ambulatory Visit: Payer: Self-pay | Admitting: Cardiology

## 2023-10-10 NOTE — Telephone Encounter (Addendum)
 Eliquis 2.5mg  refill request received. Patient is 88 years old, weight-76.7kg, Crea-1.31 on 09/15/23 via Care Everywhere from Dr Renne Crigler office, Diagnosis-Afib, and last seen by Dr. Antoine Poche on 10/04/22. Dose is inappropiate   Per Dr. Antoine Poche note on 10/04/22 it states: Of note he is on a lower dose of Eliquis because he was in the emergency room in July with a spontaneous epidural hematoma and he has been Eliquis was held. He was eventually started back at a lower dose.  Also, in the latter part of the note it states: ATRIAL FIB:     He tolerates anticoagulation. . Mr. Joseph [name] has a CHA2DS2 - VASc score of 2 .   He is not on a therapeutic dose of Eliquis based on his weight and renal function.  I did review the MRI results again from July of last year.  There was a heterogeneous epidural to intradural collection involving the central and left dorsal thecal sac extending from T12-L2 3 but could reflect evolving subacute hematoma.  The ER doctor did discuss it with the neurosurgeon on-call Dr. [name].  And it was suggested at that time that they hold the Eliquis.  At some point it was restarted at a lower dose.  I have asked the patient and his wife to go back to the neurosurgeon to find out if he would be cleared to resume the therapeutic dose   Spoke with pt and he stated he has issues with the higher dose of eliquis and it was heartburn issues and he has not discussed anything with any md's except PCP who stated recently they wanted Hochrein to change dose if needed. He states he just picked up a 90 day supply of eliquis 2.5mg  in February. Advised that once he sees Dr. Antoine Poche on 10/21/23 and he states they were not aware so gave the appt time/date and wife & pt were grateful. Advised will not send any eliquis refill today and he will need to speak with Dr. Antoine Poche on Friday, 10/21/23.

## 2023-10-20 NOTE — Progress Notes (Signed)
  Cardiology Office Note:   Date:  10/22/2023  ID:  Joseph [name], DOB [birth year], MRN [id number] PCP: [name], MD  [facility] Providers Cardiologist:  [name], MD {  History of Present Illness:   Joseph [name] is a 88 y.o. male who presents for follow up of atrial fib.  He had cardioversion in 2012 when he developed atrial fibrillation at the time of ruptured diverticulum. However, he had another recurrence of this in 2017.    He has been in chronic atrial fib.   He does not feel his atrial fibrillation.  He tries stay relatively active.  He denies any new cardiovascular symptoms.  He is had no chest pressure, neck or arm discomfort.  He had no shortness of breath, PND or orthopnea.  He had no palpitations, presyncope or syncope.  ROS: As stated in the HPI and negative for all other systems.  Studies Reviewed:    EKG:   EKG Interpretation Date/Time:  Friday October 21 2023 12:08:28 EDT Ventricular Rate:  74 PR Interval:    QRS Duration:  72 QT Interval:  364 QTC Calculation: 404 R Axis:   110  Text Interpretation: Atrial fibrillation Right axis deviation Low voltage QRS T wave abnormality, consider inferior ischemia When compared with ECG of 31-Dec-2021 11:17, No significant change since last tracing Confirmed by [name] ([id number]) on 10/21/2023 12:57:59 PM    Risk Assessment/Calculations:    CHA2DS2-VASc Score = 2   This indicates a 2.2% annual risk of stroke. The patient's score is based upon: CHF History: 0 HTN History: 0 Diabetes History: 0 Stroke History: 0 Vascular Disease History: 0 Age Score: 2 Gender Score: 0   Physical Exam:   VS:  BP 136/87   Pulse 77   Ht 5\' 6"  (1.676 m)   Wt 167 lb 3.2 oz (75.8 kg)   BMI 26.99 kg/m    Wt Readings from Last 3 Encounters:  10/21/23 167 lb 3.2 oz (75.8 kg)  08/16/23 169 lb (76.7 kg)  10/14/22 162 lb (73.5 kg)     GEN: Well nourished, well developed in no acute distress NECK: No JVD; No carotid  bruits CARDIAC: Irregular RR, no murmurs, rubs, gallops RESPIRATORY:  Clear to auscultation without rales, wheezing or rhonchi  ABDOMEN: Soft, non-tender, non-distended EXTREMITIES:  No edema; No deformity   ASSESSMENT AND PLAN:   ATRIAL FIB:     He tolerates anticoagulation. . Mr. Joseph [name] has a CHA2DS2 - VASc score of 2 .  I have had a previous long discussion with him about his dose of Eliquis.  In 2023 had an MRI of the spine with heterogeneous epidural of the intradural collection involving the central left dorsal thecal sac and T12-L2.   At the time emergency room Dr. [name] Monday with Dr. [name] who is the neurosurgeon on-call and suggested that he hold his Eliquis.  He was subsequently restarted not by me but at a lower dose.  He and I have had a long discussion about this and he would prefer this lower dose than his creatinine would suggest he needs a higher dose.    We are making a conscious decision for this lower dose understanding the risk benefit.       Follow up with me in one year.   Signed, [name], MD

## 2023-10-21 ENCOUNTER — Encounter: Payer: Self-pay | Admitting: Cardiology

## 2023-10-21 ENCOUNTER — Ambulatory Visit: Payer: Medicare PPO | Attending: Cardiology | Admitting: Cardiology

## 2023-10-21 VITALS — BP 136/87 | HR 77 | Ht 66.0 in | Wt 167.2 lb

## 2023-10-21 DIAGNOSIS — I480 Paroxysmal atrial fibrillation: Secondary | ICD-10-CM

## 2023-10-21 NOTE — Patient Instructions (Signed)

## 2023-10-22 ENCOUNTER — Encounter: Payer: Self-pay | Admitting: Cardiology

## 2023-12-14 DIAGNOSIS — R41841 Cognitive communication deficit: Secondary | ICD-10-CM | POA: Diagnosis not present

## 2023-12-14 DIAGNOSIS — M48061 Spinal stenosis, lumbar region without neurogenic claudication: Secondary | ICD-10-CM | POA: Diagnosis not present

## 2023-12-18 ENCOUNTER — Other Ambulatory Visit: Payer: Self-pay | Admitting: Cardiology

## 2023-12-19 NOTE — Telephone Encounter (Signed)
 Prescription refill request for Eliquis  received. Indication:afib Last office visit:3/25 Scr:1.31  2025 Age: 88 Weight:75.8  kg  Prescription refilled

## 2024-01-19 ENCOUNTER — Emergency Department (HOSPITAL_COMMUNITY)

## 2024-01-19 ENCOUNTER — Emergency Department (HOSPITAL_COMMUNITY)
Admission: EM | Admit: 2024-01-19 | Discharge: 2024-01-20 | Disposition: A | Discharge status: Home / Self Care | Attending: Emergency Medicine | Admitting: Emergency Medicine

## 2024-01-19 ENCOUNTER — Encounter (HOSPITAL_COMMUNITY): Payer: Self-pay

## 2024-01-19 ENCOUNTER — Other Ambulatory Visit: Payer: Self-pay

## 2024-01-19 DIAGNOSIS — I10 Essential (primary) hypertension: Secondary | ICD-10-CM | POA: Diagnosis not present

## 2024-01-19 DIAGNOSIS — Z7901 Long term (current) use of anticoagulants: Secondary | ICD-10-CM | POA: Insufficient documentation

## 2024-01-19 DIAGNOSIS — S82842A Displaced bimalleolar fracture of left lower leg, initial encounter for closed fracture: Secondary | ICD-10-CM | POA: Diagnosis not present

## 2024-01-19 DIAGNOSIS — Y9368 Activity, volleyball (beach) (court): Secondary | ICD-10-CM | POA: Diagnosis not present

## 2024-01-19 DIAGNOSIS — S82302A Unspecified fracture of lower end of left tibia, initial encounter for closed fracture: Secondary | ICD-10-CM | POA: Diagnosis not present

## 2024-01-19 DIAGNOSIS — S82892A Other fracture of left lower leg, initial encounter for closed fracture: Secondary | ICD-10-CM

## 2024-01-19 DIAGNOSIS — S82402A Unspecified fracture of shaft of left fibula, initial encounter for closed fracture: Secondary | ICD-10-CM | POA: Diagnosis not present

## 2024-01-19 DIAGNOSIS — W07XXXA Fall from chair, initial encounter: Secondary | ICD-10-CM | POA: Insufficient documentation

## 2024-01-19 DIAGNOSIS — S82432A Displaced oblique fracture of shaft of left fibula, initial encounter for closed fracture: Secondary | ICD-10-CM | POA: Diagnosis not present

## 2024-01-19 DIAGNOSIS — S99912A Unspecified injury of left ankle, initial encounter: Secondary | ICD-10-CM | POA: Diagnosis present

## 2024-01-19 DIAGNOSIS — I4891 Unspecified atrial fibrillation: Secondary | ICD-10-CM | POA: Diagnosis not present

## 2024-01-19 DIAGNOSIS — R000 Tachycardia, unspecified: Secondary | ICD-10-CM | POA: Diagnosis not present

## 2024-01-19 DIAGNOSIS — S82202A Unspecified fracture of shaft of left tibia, initial encounter for closed fracture: Secondary | ICD-10-CM | POA: Diagnosis not present

## 2024-01-19 DIAGNOSIS — R0902 Hypoxemia: Secondary | ICD-10-CM | POA: Diagnosis not present

## 2024-01-19 DIAGNOSIS — S8252XA Displaced fracture of medial malleolus of left tibia, initial encounter for closed fracture: Secondary | ICD-10-CM | POA: Diagnosis not present

## 2024-01-19 LAB — COMPREHENSIVE METABOLIC PANEL WITH GFR
ALT: 10 U/L (ref 0–44)
AST: 18 U/L (ref 15–41)
Albumin: 3.9 g/dL (ref 3.5–5.0)
Alkaline Phosphatase: 62 U/L (ref 38–126)
Anion gap: 8 (ref 5–15)
BUN: 20 mg/dL (ref 8–23)
CO2: 25 mmol/L (ref 22–32)
Calcium: 8.2 mg/dL — ABNORMAL LOW (ref 8.9–10.3)
Chloride: 106 mmol/L (ref 98–111)
Creatinine, Ser: 1.37 mg/dL — ABNORMAL HIGH (ref 0.61–1.24)
GFR, Estimated: 50 mL/min — ABNORMAL LOW (ref >60)
Glucose, Bld: 143 mg/dL — ABNORMAL HIGH (ref 70–99)
Potassium: 3.9 mmol/L (ref 3.5–5.1)
Sodium: 139 mmol/L (ref 135–145)
Total Bilirubin: 0.7 mg/dL (ref 0.0–1.2)
Total Protein: 6.8 g/dL (ref 6.5–8.1)

## 2024-01-19 LAB — CBC WITH DIFFERENTIAL/PLATELET
Abs Immature Granulocytes: 0.04 K/uL (ref 0.00–0.07)
Basophils Absolute: 0.1 K/uL (ref 0.0–0.1)
Basophils Relative: 1 %
Eosinophils Absolute: 0.1 K/uL (ref 0.0–0.5)
Eosinophils Relative: 1 %
HCT: 43.5 % (ref 39.0–52.0)
Hemoglobin: 14.5 g/dL (ref 13.0–17.0)
Immature Granulocytes: 0 %
Lymphocytes Relative: 15 %
Lymphs Abs: 1.3 K/uL (ref 0.7–4.0)
MCH: 33.6 pg (ref 26.0–34.0)
MCHC: 33.3 g/dL (ref 30.0–36.0)
MCV: 100.7 fL — ABNORMAL HIGH (ref 80.0–100.0)
Monocytes Absolute: 0.5 K/uL (ref 0.1–1.0)
Monocytes Relative: 6 %
Neutro Abs: 6.9 K/uL (ref 1.7–7.7)
Neutrophils Relative %: 77 %
Platelets: 199 K/uL (ref 150–400)
RBC: 4.32 MIL/uL (ref 4.22–5.81)
RDW: 12.8 % (ref 11.5–15.5)
WBC: 8.9 K/uL (ref 4.0–10.5)
nRBC: 0 % (ref 0.0–0.2)

## 2024-01-19 MED ORDER — PROPOFOL 10 MG/ML IV BOLUS
40.0000 mg | Freq: Once | INTRAVENOUS | Status: AC
  Administered 2024-01-19: 40 mg via INTRAVENOUS
  Filled 2024-01-19: qty 20

## 2024-01-19 MED ORDER — ACETAMINOPHEN 325 MG PO TABS
650.0000 mg | ORAL_TABLET | Freq: Once | ORAL | Status: AC
  Administered 2024-01-19: 650 mg via ORAL
  Filled 2024-01-19: qty 2

## 2024-01-19 MED ORDER — ONDANSETRON HCL 4 MG/2ML IJ SOLN
INTRAMUSCULAR | Status: AC
  Filled 2024-01-19: qty 2

## 2024-01-19 MED ORDER — OXYCODONE-ACETAMINOPHEN 5-325 MG PO TABS: ORAL_TABLET | ORAL | 0 refills | Status: DC

## 2024-01-19 NOTE — ED Provider Notes (Signed)
 [place] EMERGENCY DEPARTMENT AT [facility] Provider Note   CSN: [id number] Arrival date & time: 01/19/24  1927     Patient presents with: Ankle Pain   Joseph [name] is a 88 y.o. male.   Patient was playing chair volleyball and fell and injured his left ankle.  Patient has no other complaints  The history is provided by the patient and medical records.  Ankle Pain Location:  Ankle Injury: yes   Ankle location:  L ankle Pain details:    Quality:  Aching   Radiates to:  Does not radiate   Severity:  Moderate   Onset quality:  Sudden   Timing:  Constant   Progression:  Worsening Chronicity:  New Dislocation: yes   Associated symptoms: no back pain and no fatigue        Prior to Admission medications   Medication Sig Start Date End Date Taking? Authorizing Provider  oxyCODONE -acetaminophen  (PERCOCET) 5-325 MG tablet Take 1 pill every 6 hours for pain that is not relieved by Tylenol  alone 01/19/24  Yes Trini Soldo, MD  acetaminophen  (TYLENOL ) 650 MG CR tablet Take 1,300 mg by mouth every 8 (eight) hours as needed for pain.    [provider]  apixaban  (ELIQUIS ) 2.5 MG TABS tablet TAKE ONE TABLET BY MOUTH TWICE DAILY 12/19/23   Eilleen Grates, MD  ciprofloxacin -dexamethasone  (CIPRODEX ) OTIC suspension Place 4 drops into the left ear 2 (two) times daily. Patient not taking: Reported on 10/21/2023 08/16/23   Amin, Saad, MD  Cyanocobalamin  (B-12 PO) Take 1 tablet by mouth daily. Patient not taking: Reported on 10/21/2023    [provider]  diltiazem  (CARDIZEM ) 30 MG tablet TAKE ONE TABLET BY MOUTH EVERY TWELVE HOURS 09/21/23   Eilleen Grates, MD  fluticasone  (FLONASE ) 50 MCG/ACT nasal spray Place 1 spray into both nostrils in the morning and at bedtime. 10/14/22 10/14/23  Wayne Haines, MD  OVER THE COUNTER MEDICATION Apply 1 Application topically daily as needed (pain). Hempvana Patient not taking: Reported on 10/21/2023    [provider]   polyvinyl alcohol (LIQUIFILM TEARS) 1.4 % ophthalmic solution Place 1 drop into both eyes as needed for dry eyes. Patient not taking: Reported on 10/21/2023    [provider]  prednisoLONE sodium phosphate  (INFLAMASE FORTE) 1 % ophthalmic solution Place 1 drop into the left eye 4 (four) times daily. Use a directed Patient not taking: Reported on 10/21/2023    [provider]  TURMERIC PO Take 1 capsule by mouth daily. Patient not taking: Reported on 10/21/2023    [provider]    Allergies: Poison ivy extract and Tetracycline hcl    Review of Systems  Constitutional:  Negative for appetite change and fatigue.  HENT:  Negative for congestion, ear discharge and sinus pressure.   Eyes:  Negative for discharge.  Respiratory:  Negative for cough.   Cardiovascular:  Negative for chest pain.  Gastrointestinal:  Negative for abdominal pain and diarrhea.  Genitourinary:  Negative for frequency and hematuria.  Musculoskeletal:  Negative for back pain.       Left ankle pain  Skin:  Negative for rash.  Neurological:  Negative for seizures and headaches.  Psychiatric/Behavioral:  Negative for hallucinations.     Updated Vital Signs BP (!) 134/118   Pulse 80   Temp (!) 97.5 F (36.4 C) (Oral)   Resp 12   SpO2 96%   Physical Exam Vitals and nursing note reviewed.  Constitutional:  Appearance: He is well-developed.  HENT:     Head: Normocephalic.     Nose: Nose normal.   Eyes:     General: No scleral icterus.    Conjunctiva/sclera: Conjunctivae normal.   Neck:     Thyroid : No thyromegaly.   Cardiovascular:     Rate and Rhythm: Normal rate and regular rhythm.     Heart sounds: No murmur heard.    No friction rub. No gallop.  Pulmonary:     Breath sounds: No stridor. No wheezing or rales.  Chest:     Chest wall: No tenderness.  Abdominal:     General: There is no distension.     Tenderness: There is no abdominal tenderness. There is no rebound.    Musculoskeletal:     Cervical back: Neck supple.     Comments: Deformed left ankle with dorsalis pedis pulse 2+  Lymphadenopathy:     Cervical: No cervical adenopathy.   Skin:    Findings: No erythema or rash.   Neurological:     Mental Status: He is alert and oriented to person, place, and time.     Motor: No abnormal muscle tone.     Coordination: Coordination normal.   Psychiatric:        Behavior: Behavior normal.     (all labs ordered are listed, but only abnormal results are displayed) Labs Reviewed  CBC WITH DIFFERENTIAL/PLATELET - Abnormal; Notable for the following components:      Result Value   MCV 100.7 (*)    All other components within normal limits  COMPREHENSIVE METABOLIC PANEL WITH GFR - Abnormal; Notable for the following components:   Glucose, Bld 143 (*)    Creatinine, Ser 1.37 (*)    Calcium 8.2 (*)    GFR, Estimated 50 (*)    All other components within normal limits    EKG: None  Radiology: DG Ankle 2 Views Left Result Date: 01/19/2024 CLINICAL DATA:  Status post reduction EXAM: LEFT ANKLE - 2 VIEW COMPARISON:  Films from earlier in the same day. FINDINGS: Splinting material is now seen. The distal fibular and tibial fractures have been reduced. The talus is well seated. Posterior malleolar fracture is better visualized on this exam. IMPRESSION: Status post reduction. Distal tibial and fibular fractures are again seen. Electronically Signed   By: Violeta Grey M.D.   On: 01/19/2024 22:33   DG Ankle Complete Left Result Date: 01/19/2024 CLINICAL DATA:  Ankle injury after leg was caught in a chair. EXAM: LEFT ANKLE COMPLETE - 3+ VIEW COMPARISON:  None Available. FINDINGS: Acute displaced oblique fracture of the distal fibular diaphysis with approximately 10 mm of lateral displacement of the distal fracture component and mild apex anterior angulation. The distal fracture margin extends through the level of the proximal tibiofibular syndesmosis. Acute  transverse fracture at the base of the medial malleolus with approximately 5 mm of lateral displacement. There is posterolateral subluxation of the tibiotalar joint. IMPRESSION: 1. Acute displaced and angulated fracture of the distal fibular diaphysis. The distal fracture margin extends through the level of the proximal tibiofibular syndesmosis. 2. Acute displaced transverse fracture of the base of the medial malleolus. 3. Posterolateral subluxation of the tibiotalar joint. Electronically Signed   By: Mannie Seek M.D.   On: 01/19/2024 20:20     .Reduction of fracture  Date/Time: 01/21/2024 11:21 AM  Performed by: Cheyenne Cotta, MD Authorized by: Cheyenne Cotta, MD  Consent: Verbal consent obtained Patient understanding: patient states understanding of the  procedure being performed Patient consent: the patient's understanding of the procedure matches consent given  Sedation: Patient sedated: yes Sedatives: propofol   Comments: Patient had a fracture dislocation of his left ankle.  He was sedated with propofol  and the fracture was reduced without difficulty.  Splint was then applied.  Patient had good pulses and circulation after reduction      Medications Ordered in the ED  ondansetron  (ZOFRAN ) 4 MG/2ML injection (  Given 01/19/24 1958)  propofol  (DIPRIVAN ) 10 mg/mL bolus/IV push 40 mg (40 mg Intravenous Given 01/19/24 2202)                                    Medical Decision Making Amount and/or Complexity of Data Reviewed Labs: ordered. Radiology: ordered.  Risk OTC drugs. Prescription drug management.   Patient with a bimalleolar left ankle fracture.  It has been reduced and he will follow-up with orthopedics next week.  He will call tomorrow to get an appointment     Final diagnoses:  Closed fracture of left ankle, initial encounter    ED Discharge Orders          Ordered    oxyCODONE -acetaminophen  (PERCOCET) 5-325 MG tablet        01/19/24 2300                Cheyenne Cotta, MD 01/21/24 1122

## 2024-01-19 NOTE — ED Triage Notes (Signed)
 BIBA from [facility] facility- was playing chair volleyball, got his left ankle caught on the chair and it snapped.  200 Fentanyl  given PTA  148/86 BP 92 Afib controlled 98% room air

## 2024-01-19 NOTE — Progress Notes (Signed)
 Orthopedic Tech Progress Note Patient Details:  Joseph [name] Jul 05, 1936 [id number]  Ortho Devices Type of Ortho Device: Post (short leg) splint, Stirrup splint Ortho Device/Splint Location: lle Ortho Device/Splint Interventions: Ordered, Application, Adjustment  I applied splint post reduction with the drs assist. I requested an order.  Post Interventions Patient Tolerated: Well Instructions Provided: Care of device, Adjustment of device  Terryann Fiddler 01/19/2024, 11:04 PM

## 2024-01-19 NOTE — Discharge Instructions (Signed)
 Call [facility] tomorrow.  Tell them that you have a bad broken ankle and that you are in the emergency department.  The emergency department doctor spoke with Dr. Ricardo Chamber and he stated the patient should call on Friday to see somebody next week who specializes in foot and ankle.  Keep your leg elevated is much as possible and do not ambulate on your leg.

## 2024-01-19 NOTE — ED Notes (Signed)
 Pt vomiting, gave override Zofran  IV

## 2024-01-20 ENCOUNTER — Telehealth: Payer: Self-pay

## 2024-01-20 ENCOUNTER — Other Ambulatory Visit: Payer: Self-pay | Admitting: Orthopedic Surgery

## 2024-01-20 ENCOUNTER — Other Ambulatory Visit: Payer: Self-pay

## 2024-01-20 ENCOUNTER — Encounter (HOSPITAL_BASED_OUTPATIENT_CLINIC_OR_DEPARTMENT_OTHER): Payer: Self-pay | Admitting: Orthopedic Surgery

## 2024-01-20 ENCOUNTER — Telehealth: Payer: Self-pay | Admitting: *Deleted

## 2024-01-20 DIAGNOSIS — S82852A Displaced trimalleolar fracture of left lower leg, initial encounter for closed fracture: Secondary | ICD-10-CM | POA: Diagnosis not present

## 2024-01-20 NOTE — Telephone Encounter (Signed)
 med rec and consent done     Patient Consent for Virtual Visit        Joseph [name] has provided verbal consent on 01/20/2024 for a virtual visit (video or telephone).   CONSENT FOR VIRTUAL VISIT FOR:  Joseph [name]  By participating in this virtual visit I agree to the following:  I hereby voluntarily request, consent and authorize [facility] and its employed or contracted physicians, physician assistants, nurse practitioners or other licensed health care professionals (the Practitioner), to provide me with telemedicine health care services (the "Services) as deemed necessary by the treating Practitioner. I acknowledge and consent to receive the Services by the Practitioner via telemedicine. I understand that the telemedicine visit will involve communicating with the Practitioner through live audiovisual communication technology and the disclosure of certain medical information by electronic transmission. I acknowledge that I have been given the opportunity to request an in-person assessment or other available alternative prior to the telemedicine visit and am voluntarily participating in the telemedicine visit.  I understand that I have the right to withhold or withdraw my consent to the use of telemedicine in the course of my care at any time, without affecting my right to future care or treatment, and that the Practitioner or I may terminate the telemedicine visit at any time. I understand that I have the right to inspect all information obtained and/or recorded in the course of the telemedicine visit and may receive copies of available information for a reasonable fee.  I understand that some of the potential risks of receiving the Services via telemedicine include:  Delay or interruption in medical evaluation due to technological equipment failure or disruption; Information transmitted may not be sufficient (e.g. poor resolution of images) to allow for appropriate medical decision  making by the Practitioner; and/or  In rare instances, security protocols could fail, causing a breach of personal health information.  Furthermore, I acknowledge that it is my responsibility to provide information about my medical history, conditions and care that is complete and accurate to the best of my ability. I acknowledge that Practitioner's advice, recommendations, and/or decision may be based on factors not within their control, such as incomplete or inaccurate data provided by me or distortions of diagnostic images or specimens that may result from electronic transmissions. I understand that the practice of medicine is not an exact science and that Practitioner makes no warranties or guarantees regarding treatment outcomes. I acknowledge that a copy of this consent can be made available to me via my patient portal [facility] MyChart), or I can request a printed copy by calling the office of  [facility].    I understand that my insurance will be billed for this visit.   I have read or had this consent read to me. I understand the contents of this consent, which adequately explains the benefits and risks of the Services being provided via telemedicine.  I have been provided ample opportunity to ask questions regarding this consent and the Services and have had my questions answered to my satisfaction. I give my informed consent for the services to be provided through the use of telemedicine in my medical care

## 2024-01-20 NOTE — Telephone Encounter (Signed)
 S/W pt and scheduled pt for TELE Preop appt 01/24/24. med rec and consent done

## 2024-01-20 NOTE — Telephone Encounter (Signed)
   Pre-operative Risk Assessment    Patient Name: Joseph [name]  DOB: 12-22-1935 MRN: [id number]   Date of last office visit: 10/21/23 DR. HOCHRIEN Date of next office visit: NONE   Request for Surgical Clearance    Procedure:  ORIF LEFT ANKLE TRIMAL FRACTURE  Date of Surgery:  Clearance 01/26/24                                Surgeon:  DR. Autry Legions HEWITT Surgeon's Group or Practice Name:  [facility] Phone number:  [phone] Fax number:  [phone] MEGAN Schoening   Type of Clearance Requested:   - Medical  - Pharmacy:  Hold Apixaban  (Eliquis )     Type of Anesthesia:  General    Additional requests/questions:    [place] Broom   01/20/2024, 10:37 AM

## 2024-01-20 NOTE — Telephone Encounter (Signed)
 Patient with diagnosis of afib on Eliquis  for anticoagulation.    Procedure: ORIF LEFT ANKLE TRIMAL FRACTURE  Date of procedure: 01/26/24   CHA2DS2-VASc Score = 2   This indicates a 2.2% annual risk of stroke. The patient's score is based upon: CHF History: 0 HTN History: 0 Diabetes History: 0 Stroke History: 0 Vascular Disease History: 0 Age Score: 2 Gender Score: 0    CrCl 38 mL/min Platelet count 199   Patient has not  had an Afib/aflutter ablation within the last 3 months or DCCV within the last 30 days  Per office protocol, patient can hold Eliquis  for 3 days prior to procedure.   Patient will not need bridging with Lovenox (enoxaparin) around procedure.  **This guidance is not considered finalized until pre-operative APP has relayed final recommendations.**

## 2024-01-24 ENCOUNTER — Ambulatory Visit: Attending: Cardiology | Admitting: Student

## 2024-01-24 DIAGNOSIS — Z01810 Encounter for preprocedural cardiovascular examination: Secondary | ICD-10-CM

## 2024-01-24 NOTE — Progress Notes (Signed)
 Virtual Visit via Telephone Note   Because of Joseph E Closser's co-morbid illnesses, he is at least at moderate risk for complications without adequate follow up.  This format is felt to be most appropriate for this patient at this time.  The patient did not have access to video technology/had technical difficulties with video requiring transitioning to audio format only (telephone).  All issues noted in this document were discussed and addressed.  No physical exam could be performed with this format.  Please refer to the patient's chart for his consent to telehealth for [facility].  Evaluation Performed:  Preoperative cardiovascular risk assessment _____________   Date:  01/24/2024   Patient ID:  Joseph [name], DOB October 07, 1935, MRN [id number] Patient Location:  Home Provider location:   Office  Primary Care Provider:  Imelda Man, MD Primary Cardiologist:  Eilleen Grates, MD  Chief Complaint / Patient Profile   88 y.o. y/o male with a h/o permanent A-fib on anticoagulation, diverticulitis, prostate cancer, CKD stage II who is pending ORIF left ankle trimalleolar fracture by Dr. Rosebud Confer on 01/26/2024 and presents today for telephonic preoperative cardiovascular risk assessment.  History of Present Illness    Joseph [name] is a 88 y.o. male who presents via audio/video conferencing for a telehealth visit today.  Pt was last seen in cardiology clinic on 10/21/2023 by Dr. Lavonne Prairie.  At that time LEON MONTOYA was stable from a cardiac standpoint.  The patient is now pending procedure as outlined above. Since his last visit, he is doing well. Patient denies shortness of breath, lower extremity edema, orthopnea or PND. He reports dyspnea with heavier exertion particularly using the scooter for ambulation. No chest pain, pressure, or tightness. No palpitations. Prior to injuring his ankle, he was very active at his independent living facility. He played chair volley ball a few times a week.    Past Medical History    Past Medical History:  Diagnosis Date   Arthritis    Atrial fibrillation (HCC)    resolved with cardioversion   Chronic kidney disease    Complication of anesthesia    Dysrhythmia    Atrial Fibrilation   GERD (gastroesophageal reflux disease)    History of skin cancer    PONV (postoperative nausea and vomiting)    Prostate cancer (HCC)    Sigmoid diverticulitis April of 2012   with small perforation   Squamous cell carcinoma    Past Surgical History:  Procedure Laterality Date   CARDIOVERSION  01/07/2009   LASIK     LUMBAR LAMINECTOMY/DECOMPRESSION MICRODISCECTOMY N/A 05/06/2021   Procedure: Lumbar decompression and insitu fusion Lumbar four - Lumbar five;  Surgeon: Mort Ards, MD;  Location: [facility] OR;  Service: Orthopedics;  Laterality: N/A;  3 hrs 3 C-Bed   LUMBAR LAMINECTOMY/DECOMPRESSION MICRODISCECTOMY N/A 03/08/2022   Procedure: THORACIC TWELVE TO LUMBAR FIVE DECOMPRESSION;  Surgeon: Mort Ards, MD;  Location: [facility] OR;  Service: Orthopedics;  Laterality: N/A;  2hr 3 C-Bed   LYMPHADENECTOMY Bilateral 01/17/2015   Procedure: PELVIC LYMPHADENECTOMY;  Surgeon: Osborn Blaze, MD;  Location: [facility] ORS;  Service: Urology;  Laterality: Bilateral;   NASAL SEPTUM SURGERY     REVERSE SHOULDER ARTHROPLASTY Right 08/19/2022   Procedure: REVERSE SHOULDER ARTHROPLASTY;  Surgeon: Ellard Gunning, MD;  Location: [facility] ORS;  Service: Orthopedics;  Laterality: Right;    ROBOT ASSISTED LAPAROSCOPIC RADICAL PROSTATECTOMY N/A 01/17/2015   Procedure: ROBOTIC ASSISTED LAPAROSCOPIC RADICAL PROSTATECTOMY WITH INDOCYANINE GREEN DYE INJECTION;  Surgeon: Osborn Blaze,  MD;  Location: [facility] ORS;  Service: Urology;  Laterality: N/A;   SHOULDER SURGERY     x 2   TONSILLECTOMY     as a child    Allergies  Allergies  Allergen Reactions   Poison Ivy Extract Rash   Tetracycline Hcl Rash    Home Medications    Prior to Admission medications   Medication Sig Start Date  End Date Taking? Authorizing Provider  acetaminophen  (TYLENOL ) 650 MG CR tablet Take 1,300 mg by mouth every 8 (eight) hours as needed for pain.    [provider]  apixaban  (ELIQUIS ) 2.5 MG TABS tablet TAKE ONE TABLET BY MOUTH TWICE DAILY 12/19/23   Eilleen Grates, MD  diltiazem  (CARDIZEM ) 30 MG tablet TAKE ONE TABLET BY MOUTH EVERY TWELVE HOURS 09/21/23   Eilleen Grates, MD    Physical Exam    Vital Signs:  SHAHEEM [name] does not have vital signs available for review today.  Given telephonic nature of communication, physical exam is limited. AAOx3. NAD. Normal affect.  Speech and respirations are unlabored.   Assessment & Plan    Primary Cardiologist: Eilleen Grates, MD  Preoperative cardiovascular risk assessment.  ORIF left ankle trimalleolar fracture by Dr. Rosebud Confer on 01/26/2024.  Chart reviewed as part of pre-operative protocol coverage. According to the RCRI, patient has a 0.4-3.9% risk of MACE. Patient reports activity equivalent to >4.0 METS (chair volley ball).   Given past medical history and time since last visit, based on ACC/AHA guidelines, TRAVERS GOODLEY would be at acceptable risk for the planned procedure without further cardiovascular testing.   Patient was advised that if he develops new symptoms prior to surgery to contact our office to arrange a follow-up appointment.  he verbalized understanding.  Per Pharm D, patient has not had an Afib/aflutter ablation within the last 3 months or DCCV within the last 30 days. Patient may hold Eliquis  for 3 days prior to procedure.  Patient will not need bridging with Lovenox around procedure.    I will route this recommendation to the requesting party via Epic fax function.  Please call with questions.  Time:   Today, I have spent 5 minutes with the patient with telehealth technology discussing medical history, symptoms, and management plan.     Morey Ar, NP  01/24/2024, 7:48 AM

## 2024-01-25 NOTE — Anesthesia Preprocedure Evaluation (Addendum)
 Anesthesia Evaluation  Patient identified by MRN, date of birth, ID band Patient awake    Reviewed: Allergy & Precautions, NPO status , Patient's Chart, lab work & pertinent test results  Airway Mallampati: II  TM Distance: >3 FB Neck ROM: Full    Dental no notable dental hx. (+) Teeth Intact, Dental Advisory Given   Pulmonary    Pulmonary exam normal breath sounds clear to auscultation       Cardiovascular Normal cardiovascular exam+ dysrhythmias Atrial Fibrillation  Rhythm:Regular Rate:Normal     Neuro/Psych    GI/Hepatic   Endo/Other    Renal/GU Renal InsufficiencyRenal diseaseStage 2   Prostate CA    Musculoskeletal  (+) Arthritis ,    Abdominal   Peds  Hematology   Anesthesia Other Findings All: Tetracycline  Reproductive/Obstetrics                             Anesthesia Physical Anesthesia Plan  ASA: 2  Anesthesia Plan: Regional and General   Post-op Pain Management: Ofirmev  IV (intra-op)*, Regional block* and Minimal or no pain anticipated   Induction: Intravenous  PONV Risk Score and Plan: Propofol  infusion, TIVA, Treatment may vary due to age or medical condition and Ondansetron   Airway Management Planned: LMA  Additional Equipment: None  Intra-op Plan:   Post-operative Plan: Extubation in OR  Informed Consent: I have reviewed the patients History and Physical, chart, labs and discussed the procedure including the risks, benefits and alternatives for the proposed anesthesia with the patient or authorized representative who has indicated his/her understanding and acceptance.     Dental advisory given  Plan Discussed with: CRNA and Surgeon  Anesthesia Plan Comments:        Anesthesia Quick Evaluation

## 2024-01-26 ENCOUNTER — Ambulatory Visit (HOSPITAL_BASED_OUTPATIENT_CLINIC_OR_DEPARTMENT_OTHER)
Admission: RE | Admit: 2024-01-26 | Discharge: 2024-01-26 | Disposition: A | Discharge status: Home / Self Care | Attending: Orthopedic Surgery | Admitting: Orthopedic Surgery

## 2024-01-26 ENCOUNTER — Other Ambulatory Visit: Payer: Self-pay

## 2024-01-26 ENCOUNTER — Encounter (HOSPITAL_BASED_OUTPATIENT_CLINIC_OR_DEPARTMENT_OTHER): Payer: Self-pay | Admitting: Orthopedic Surgery

## 2024-01-26 ENCOUNTER — Ambulatory Visit (HOSPITAL_BASED_OUTPATIENT_CLINIC_OR_DEPARTMENT_OTHER)

## 2024-01-26 ENCOUNTER — Ambulatory Visit (HOSPITAL_BASED_OUTPATIENT_CLINIC_OR_DEPARTMENT_OTHER): Payer: Self-pay | Admitting: Anesthesiology

## 2024-01-26 ENCOUNTER — Encounter (HOSPITAL_BASED_OUTPATIENT_CLINIC_OR_DEPARTMENT_OTHER)
Admission: RE | Disposition: A | Discharge status: Home / Self Care | Payer: Self-pay | Source: Home / Self Care | Attending: Orthopedic Surgery

## 2024-01-26 DIAGNOSIS — W19XXXA Unspecified fall, initial encounter: Secondary | ICD-10-CM | POA: Insufficient documentation

## 2024-01-26 DIAGNOSIS — S82842A Displaced bimalleolar fracture of left lower leg, initial encounter for closed fracture: Secondary | ICD-10-CM | POA: Diagnosis not present

## 2024-01-26 DIAGNOSIS — G8918 Other acute postprocedural pain: Secondary | ICD-10-CM | POA: Diagnosis not present

## 2024-01-26 DIAGNOSIS — I4891 Unspecified atrial fibrillation: Secondary | ICD-10-CM | POA: Insufficient documentation

## 2024-01-26 DIAGNOSIS — S82852A Displaced trimalleolar fracture of left lower leg, initial encounter for closed fracture: Secondary | ICD-10-CM

## 2024-01-26 DIAGNOSIS — Z8546 Personal history of malignant neoplasm of prostate: Secondary | ICD-10-CM | POA: Diagnosis not present

## 2024-01-26 DIAGNOSIS — Z01818 Encounter for other preprocedural examination: Secondary | ICD-10-CM

## 2024-01-26 DIAGNOSIS — N182 Chronic kidney disease, stage 2 (mild): Secondary | ICD-10-CM | POA: Diagnosis not present

## 2024-01-26 HISTORY — PX: ORIF ANKLE FRACTURE: SHX5408

## 2024-01-26 HISTORY — DX: Chronic kidney disease, unspecified: N18.9

## 2024-01-26 SURGERY — OPEN REDUCTION INTERNAL FIXATION (ORIF) ANKLE FRACTURE
Anesthesia: Regional | Site: Ankle | Laterality: Left

## 2024-01-26 MED ORDER — PROPOFOL 10 MG/ML IV BOLUS
INTRAVENOUS | Status: AC
  Filled 2024-01-26: qty 20

## 2024-01-26 MED ORDER — HYDROCODONE-ACETAMINOPHEN 5-325 MG PO TABS: 1.0000 | ORAL_TABLET | Freq: Four times a day (QID) | ORAL | 0 refills | Status: AC | PRN

## 2024-01-26 MED ORDER — ROPIVACAINE HCL 5 MG/ML IJ SOLN
INTRAMUSCULAR | Status: DC | PRN
  Administered 2024-01-26: 20 mL via PERINEURAL

## 2024-01-26 MED ORDER — PROPOFOL 500 MG/50ML IV EMUL
INTRAVENOUS | Status: DC | PRN
  Administered 2024-01-26: 150 ug/kg/min via INTRAVENOUS

## 2024-01-26 MED ORDER — PROPOFOL 10 MG/ML IV BOLUS
INTRAVENOUS | Status: DC | PRN
  Administered 2024-01-26: 120 mg via INTRAVENOUS
  Administered 2024-01-26: 10 mg via INTRAVENOUS

## 2024-01-26 MED ORDER — FENTANYL CITRATE (PF) 100 MCG/2ML IJ SOLN
100.0000 ug | Freq: Once | INTRAMUSCULAR | Status: AC
  Administered 2024-01-26: 25 ug via INTRAVENOUS

## 2024-01-26 MED ORDER — VANCOMYCIN HCL 500 MG IV SOLR
INTRAVENOUS | Status: DC | PRN
  Administered 2024-01-26: 500 mg via TOPICAL

## 2024-01-26 MED ORDER — DEXMEDETOMIDINE HCL IN NACL 80 MCG/20ML IV SOLN
INTRAVENOUS | Status: DC | PRN
Start: 2024-01-26 — End: 2024-01-26
  Administered 2024-01-26: 4 ug via INTRAVENOUS

## 2024-01-26 MED ORDER — LACTATED RINGERS IV SOLN: INTRAVENOUS | Status: DC

## 2024-01-26 MED ORDER — CLONIDINE HCL (ANALGESIA) 100 MCG/ML EP SOLN
EPIDURAL | Status: DC | PRN
  Administered 2024-01-26: 50 ug

## 2024-01-26 MED ORDER — FENTANYL CITRATE (PF) 100 MCG/2ML IJ SOLN: 25.0000 ug | INTRAMUSCULAR | Status: DC | PRN

## 2024-01-26 MED ORDER — 0.9 % SODIUM CHLORIDE (POUR BTL) OPTIME
TOPICAL | Status: DC | PRN
  Administered 2024-01-26: 200 mL

## 2024-01-26 MED ORDER — CEFAZOLIN SODIUM-DEXTROSE 2-4 GM/100ML-% IV SOLN
INTRAVENOUS | Status: AC
  Filled 2024-01-26: qty 100

## 2024-01-26 MED ORDER — BUPIVACAINE HCL (PF) 0.5 % IJ SOLN
INTRAMUSCULAR | Status: DC | PRN
Start: 2024-01-26 — End: 2024-01-26
  Administered 2024-01-26: 15 mL via PERINEURAL

## 2024-01-26 MED ORDER — BUPIVACAINE LIPOSOME 1.3 % IJ SUSP
INTRAMUSCULAR | Status: DC | PRN
  Administered 2024-01-26: 10 mL via PERINEURAL

## 2024-01-26 MED ORDER — ACETAMINOPHEN 10 MG/ML IV SOLN: 1000.0000 mg | Freq: Once | INTRAVENOUS | Status: DC | PRN

## 2024-01-26 MED ORDER — ONDANSETRON HCL 4 MG/2ML IJ SOLN
4.0000 mg | Freq: Once | INTRAMUSCULAR | Status: DC | PRN
Start: 2024-01-26 — End: 2024-01-26

## 2024-01-26 MED ORDER — FENTANYL CITRATE (PF) 100 MCG/2ML IJ SOLN
INTRAMUSCULAR | Status: AC
  Filled 2024-01-26: qty 2

## 2024-01-26 MED ORDER — MIDAZOLAM HCL 2 MG/2ML IJ SOLN
INTRAMUSCULAR | Status: AC
Start: 2024-01-26 — End: 2024-01-26
  Filled 2024-01-26: qty 2

## 2024-01-26 MED ORDER — PHENYLEPHRINE HCL (PRESSORS) 10 MG/ML IV SOLN
INTRAVENOUS | Status: DC | PRN
  Administered 2024-01-26 (×6): 80 ug via INTRAVENOUS
  Administered 2024-01-26: 160 ug via INTRAVENOUS
  Administered 2024-01-26 (×2): 80 ug via INTRAVENOUS

## 2024-01-26 MED ORDER — CEFAZOLIN SODIUM-DEXTROSE 2-4 GM/100ML-% IV SOLN
2.0000 g | INTRAVENOUS | Status: AC
  Administered 2024-01-26: 2 g via INTRAVENOUS

## 2024-01-26 MED ORDER — DEXAMETHASONE SODIUM PHOSPHATE 10 MG/ML IJ SOLN
INTRAMUSCULAR | Status: DC | PRN
  Administered 2024-01-26: 4 mg via INTRAVENOUS

## 2024-01-26 MED ORDER — VANCOMYCIN HCL 500 MG IV SOLR
INTRAVENOUS | Status: AC
  Filled 2024-01-26: qty 10

## 2024-01-26 SURGICAL SUPPLY — 61 items
BIT DRILL 2.5X2.75 QC CALB (BIT) IMPLANT
BIT DRILL 2.9 CANN QC NONSTRL (BIT) IMPLANT
BLADE SURG 15 STRL LF DISP TIS (BLADE) ×2 IMPLANT
BNDG ELASTIC 4INX 5YD STR LF (GAUZE/BANDAGES/DRESSINGS) ×1 IMPLANT
BNDG ELASTIC 6INX 5YD STR LF (GAUZE/BANDAGES/DRESSINGS) ×1 IMPLANT
CANISTER SUCT 1200ML W/VALVE (MISCELLANEOUS) ×1 IMPLANT
CHLORAPREP W/TINT 26 (MISCELLANEOUS) ×1 IMPLANT
COVER BACK TABLE 60X90IN (DRAPES) ×1 IMPLANT
CUFF TRNQT CYL 34X4.125X (TOURNIQUET CUFF) IMPLANT
DRAPE EXTREMITY T 121X128X90 (DISPOSABLE) ×1 IMPLANT
DRAPE OEC MINIVIEW 54X84 (DRAPES) ×1 IMPLANT
DRAPE U-SHAPE 47X51 STRL (DRAPES) ×1 IMPLANT
DRSG MEPITEL 4X7.2 (GAUZE/BANDAGES/DRESSINGS) ×1 IMPLANT
ELECTRODE REM PT RTRN 9FT ADLT (ELECTROSURGICAL) ×1 IMPLANT
GAUZE PAD ABD 8X10 STRL (GAUZE/BANDAGES/DRESSINGS) ×2 IMPLANT
GAUZE SPONGE 4X4 12PLY STRL (GAUZE/BANDAGES/DRESSINGS) ×1 IMPLANT
GLOVE BIO SURGEON STRL SZ8 (GLOVE) ×1 IMPLANT
GLOVE BIOGEL PI IND STRL 6.5 (GLOVE) IMPLANT
GLOVE BIOGEL PI IND STRL 7.0 (GLOVE) IMPLANT
GLOVE BIOGEL PI IND STRL 7.5 (GLOVE) IMPLANT
GLOVE BIOGEL PI IND STRL 8 (GLOVE) ×2 IMPLANT
GLOVE SURG SYN 8.0 (GLOVE) ×1 IMPLANT
GLOVE SURG SYN 8.0 PF PI (GLOVE) IMPLANT
GOWN STRL REUS W/ TWL LRG LVL3 (GOWN DISPOSABLE) ×1 IMPLANT
GOWN STRL REUS W/ TWL XL LVL3 (GOWN DISPOSABLE) ×2 IMPLANT
GOWN STRL REUS W/TWL 2XL LVL3 (GOWN DISPOSABLE) IMPLANT
KWIRE ACE 1.6X6 (WIRE) IMPLANT
NDL HYPO 22X1.5 SAFETY MO (MISCELLANEOUS) IMPLANT
NEEDLE HYPO 22X1.5 SAFETY MO (MISCELLANEOUS) IMPLANT
NS IRRIG 1000ML POUR BTL (IV SOLUTION) ×1 IMPLANT
PACK BASIN DAY SURGERY FS (CUSTOM PROCEDURE TRAY) ×1 IMPLANT
PAD CAST 4YDX4 CTTN HI CHSV (CAST SUPPLIES) ×1 IMPLANT
PADDING CAST ABS COTTON 4X4 ST (CAST SUPPLIES) IMPLANT
PADDING CAST COTTON 6X4 STRL (CAST SUPPLIES) ×1 IMPLANT
PENCIL SMOKE EVACUATOR (MISCELLANEOUS) ×1 IMPLANT
PLATE FIBULAR COMP LOCK 10H (Plate) IMPLANT
SCREW ACE CAN 4.0 40M (Screw) IMPLANT
SCREW CORTICAL LOW PROF 3.5X20 (Screw) IMPLANT
SCREW LOCK CORT STAR 3.5X12 (Screw) IMPLANT
SCREW LOCK CORT STAR 3.5X14 (Screw) IMPLANT
SCREW LOW PROFILE 18MMX3.5MM (Screw) IMPLANT
SCREW LOW PROFILE 22MMX3.5MM (Screw) IMPLANT
SCREW NON LOCKING LP 3.5 16MM (Screw) IMPLANT
SHEET MEDIUM DRAPE 40X70 STRL (DRAPES) ×1 IMPLANT
SLEEVE SCD COMPRESS KNEE MED (STOCKING) ×1 IMPLANT
SPIKE FLUID TRANSFER (MISCELLANEOUS) IMPLANT
SPLINT PLASTER CAST FAST 5X30 (CAST SUPPLIES) ×20 IMPLANT
SPONGE T-LAP 18X18 ~~LOC~~+RFID (SPONGE) ×1 IMPLANT
STOCKINETTE 6 STRL (DRAPES) ×1 IMPLANT
SUCTION TUBE FRAZIER 10FR DISP (SUCTIONS) ×1 IMPLANT
SUT ETHILON 3 0 PS 1 (SUTURE) ×1 IMPLANT
SUT MNCRL AB 3-0 PS2 18 (SUTURE) IMPLANT
SUT VIC AB 2-0 SH 27XBRD (SUTURE) ×1 IMPLANT
SUT VICRYL 0 SH 27 (SUTURE) IMPLANT
SUTURE FIBERWR #2 38 T-5 BLUE (SUTURE) IMPLANT
SYR BULB EAR ULCER 3OZ GRN STR (SYRINGE) ×1 IMPLANT
SYR CONTROL 10ML LL (SYRINGE) IMPLANT
TOWEL GREEN STERILE FF (TOWEL DISPOSABLE) ×2 IMPLANT
TUBE CONNECTING 20X1/4 (TUBING) ×1 IMPLANT
UNDERPAD 30X36 HEAVY ABSORB (UNDERPADS AND DIAPERS) ×1 IMPLANT
WASHER PLAIN FLAT ACE NS 3PK (Orthopedic Implant) IMPLANT

## 2024-01-26 NOTE — Anesthesia Procedure Notes (Addendum)
 Procedure Name: LMA Insertion Date/Time: 01/26/2024 2:34 PM  Performed by: Arvilla Birmingham, CRNAPre-anesthesia Checklist: Patient identified, Emergency Drugs available, Suction available and Patient being monitored Patient Re-evaluated:Patient Re-evaluated prior to induction Oxygen Delivery Method: Circle system utilized Preoxygenation: Pre-oxygenation with 100% oxygen Induction Type: IV induction Ventilation: Mask ventilation without difficulty LMA: LMA inserted LMA Size: 5.0 Number of attempts: 1 Airway Equipment and Method: Bite block Placement Confirmation: positive ETCO2 Tube secured with: Tape Dental Injury: Teeth and Oropharynx as per pre-operative assessment

## 2024-01-26 NOTE — Discharge Instructions (Addendum)
 Amada Backer, MD [facility]  Please read the following information regarding your care after surgery.  Medications  You only need a prescription for the narcotic pain medicine (ex. oxycodone , Percocet, Norco).  All of the other medicines listed below are available over the counter. X Aleve 2 pills twice a day for the first 3 days after surgery. X hydrocodone  as prescribed for severe pain  Narcotic pain medicine (ex. oxycodone , Percocet, Vicodin) will cause constipation.  To prevent this problem, take the following medicines while you are taking any pain medicine. X docusate sodium  (Colace) 100 mg twice a day X senna (Senokot) 2 tablets twice a day  X To help prevent blood clots, resume your Eliquis  the day after surgery.  You should also get up every hour while you are awake to move around.    Weight Bearing X Do not bear any weight on the operated leg or foot.  Cast / Splint / Dressing X Keep your splint, cast or dressing clean and dry.  Don't put anything (coat hanger, pencil, etc) down inside of it.  If it gets damp, use a hair dryer on the cool setting to dry it.  If it gets soaked, call the office to schedule an appointment for a cast change.  After your dressing, cast or splint is removed; you may shower, but do not soak or scrub the wound.  Allow the water  to run over it, and then gently pat it dry.  Swelling It is normal for you to have swelling where you had surgery.  To reduce swelling and pain, keep your toes above your nose for at least 3 days after surgery.  It may be necessary to keep your foot or leg elevated for several weeks.  If it hurts, it should be elevated.  Follow Up Call my office at [phone] when you are discharged from the hospital or surgery center to schedule an appointment to be seen two weeks after surgery.  Call my office at [phone] if you develop a fever >101.5 F, nausea, vomiting, bleeding from the surgical site or severe pain.      Post  Anesthesia Home Care Instructions  Activity: Get plenty of rest for the remainder of the day. A responsible individual must stay with you for 24 hours following the procedure.  For the next 24 hours, DO NOT: -Drive a car -Advertising copywriter -Drink alcoholic beverages -Take any medication unless instructed by your physician -Make any legal decisions or sign important papers.  Meals: Start with liquid foods such as gelatin or soup. Progress to regular foods as tolerated. Avoid greasy, spicy, heavy foods. If nausea and/or vomiting occur, drink only clear liquids until the nausea and/or vomiting subsides. Call your physician if vomiting continues.  Special Instructions/Symptoms: Your throat may feel dry or sore from the anesthesia or the breathing tube placed in your throat during surgery. If this causes discomfort, gargle with warm salt water . The discomfort should disappear within 24 hours.  If you had a scopolamine patch placed behind your ear for the management of post- operative nausea and/or vomiting:  1. The medication in the patch is effective for 72 hours, after which it should be removed.  Wrap patch in a tissue and discard in the trash. Wash hands thoroughly with soap and water . 2. You may remove the patch earlier than 72 hours if you experience unpleasant side effects which may include dry mouth, dizziness or visual disturbances. 3. Avoid touching the patch. Wash your hands with soap and  water  after contact with the patch.

## 2024-01-26 NOTE — Op Note (Signed)
 01/26/2024  4:04 PM  PATIENT:  Joseph [name]  88 y.o. male  PRE-OPERATIVE DIAGNOSIS: Left ankle bimalleolar fracture  POST-OPERATIVE DIAGNOSIS: Same  Procedure(s): 1.  Open treatment left ankle bimalleolar fracture with internal fixation 2.  Stress examination of the left ankle 3.  AP, mortise and lateral radiographs of the left ankle  SURGEON:  Amada Backer, MD  ASSISTANT: None  ANESTHESIA:   General, regional  EBL:  minimal   TOURNIQUET:   Total Tourniquet Time Documented: Thigh (Left) - 66 minutes Total: Thigh (Left) - 66 minutes  COMPLICATIONS:  None apparent  DISPOSITION:  Extubated, awake and stable to recovery.  INDICATION FOR PROCEDURE: 88 year old male fell over a week ago injuring his left ankle.  He has a displaced bimalleolar fracture.  He presents now for operative treatment of this unstable left ankle injury.  The risks and benefits of the alternative treatment options have been discussed in detail.  The patient wishes to proceed with surgery and specifically understands risks of bleeding, infection, nerve damage, blood clots, need for additional surgery, amputation and death.    PROCEDURE IN DETAIL:  After pre operative consent was obtained, and the correct operative site was identified, the patient was brought to the operating room and placed supine on the OR table.  Anesthesia was administered.  Pre-operative antibiotics were administered.  A surgical timeout was taken.  The left lower extremity was prepped and draped in standard sterile fashion with a tourniquet around the thigh.  The extremity was elevated, and the tourniquet was inflated to 250 mmHg.  A longitudinal incision was made over the lateral malleolus.  Dissection was carried sharply down through the subcutaneous tissues.  The fracture site was cleaned of all hematoma and irrigated.  There was significant comminution at the level of the fracture.  A 10 hole composite locking plate from the [employer]  set was selected.  It was contoured to fit the lateral malleolus distally.  It was provisionally pinned to the lateral malleolus.  Radiographs confirmed appropriate position of the plate.  The plate was then secured to bone nonlocking screw.  2 unicortical locking screws were then inserted followed by a final nonlocking screw.  The plate was then used as a reduction tool.  Fracture was reduced and the plate was clamped to the proximal fragment.  Radiographs confirmed appropriate reduction of the fracture.  The plate was secured to bone with a 3.5 mm fully threaded nonlocking screw through the slotted hole of the plate.  The fracture site was allowed to compress.  The plate was further secured with a second bicortical nonlocking screw and finally with a locking bicortical screw.  Attention was then turned to the medial ankle where an incision was made over the medial malleolus fracture site.  Dissection was carried down through the subcutaneous tissues.  The fracture was cleaned of all hematoma and irrigated.  It was reduced and provisionally pinned.  Radiographs confirmed appropriate reduction of the fracture.  The fracture fragment was too small for a second screw.  The provisional K wire was overdrilled.  A 4 mm partially-threaded cannulated screw with a small washer was inserted.  This compressed the fracture site appropriately.  AP, mortise and lateral radiographs confirmed appropriate reduction of the fractures in appropriate position and length of the hardware.  Stress examination was then performed.  Dorsiflexion and external rotation stress was applied to the supinated forefoot.  There was no instability evident at the syndesmosis.  Both wounds were  then irrigated copiously and sprinkled with vancomycin  powder.  Deep subcutaneous tissues were approximated with 2-0 Vicryl.  Skin incisions were closed with running 3-0 nylon laterally and horizontal mattress sutures of 3-0 nylon medially.  Sterile dressings  were applied followed by a compression wrap and a well-padded short leg splint.  The tourniquet was released after application of the dressings.  The patient was awakened from anesthesia and transported to the recovery room in stable condition.   FOLLOW UP PLAN: Nonweightbearing on the left lower extremity.  Follow-up 2 weeks for suture removal with conversion to a short cast.  He will resume Eliquis  on postop day 1 which will serve for DVT prophylaxis.   RADIOGRAPHS: AP, mortise and lateral radiographs of the left ankle are obtained intraoperatively.  These show interval reduction fixation talus fractures.  Hardware is appropriately positioned and of the appropriate lengths.  No acute injuries are noted.    Justin Ollis PA-C was present and scrubbed for the duration of the operative case. His assistance was essential in positioning the patient, prepping and draping, gaining and maintaining exposure, performing the operation, closing and dressing the wounds and applying the splint.

## 2024-01-26 NOTE — H&P (Signed)
 Joseph [name] is an 88 y.o. male.   Chief Complaint: left ankle pain HPI: 88 y/o male with PMH of a fib and prostate cancer c/o L ankle pain since a fall last week.  He has a displaced bimal fracture and presents for ORIF today.  Past Medical History:  Diagnosis Date   Arthritis    Atrial fibrillation (HCC)    resolved with cardioversion   Chronic kidney disease    Complication of anesthesia    Dysrhythmia    Atrial Fibrilation   GERD (gastroesophageal reflux disease)    History of skin cancer    PONV (postoperative nausea and vomiting)    Prostate cancer (HCC)    Sigmoid diverticulitis April of 2012   with small perforation   Squamous cell carcinoma     Past Surgical History:  Procedure Laterality Date   CARDIOVERSION  01/07/2009   LASIK     LUMBAR LAMINECTOMY/DECOMPRESSION MICRODISCECTOMY N/A 05/06/2021   Procedure: Lumbar decompression and insitu fusion Lumbar four - Lumbar five;  Surgeon: Mort Ards, MD;  Location: [facility] OR;  Service: Orthopedics;  Laterality: N/A;  3 hrs 3 C-Bed   LUMBAR LAMINECTOMY/DECOMPRESSION MICRODISCECTOMY N/A 03/08/2022   Procedure: THORACIC TWELVE TO LUMBAR FIVE DECOMPRESSION;  Surgeon: Mort Ards, MD;  Location: [facility] OR;  Service: Orthopedics;  Laterality: N/A;  2hr 3 C-Bed   LYMPHADENECTOMY Bilateral 01/17/2015   Procedure: PELVIC LYMPHADENECTOMY;  Surgeon: Osborn Blaze, MD;  Location: [facility];  Service: Urology;  Laterality: Bilateral;   NASAL SEPTUM SURGERY     REVERSE SHOULDER ARTHROPLASTY Right 08/19/2022   Procedure: REVERSE SHOULDER ARTHROPLASTY;  Surgeon: Ellard Gunning, MD;  Location: [facility];  Service: Orthopedics;  Laterality: Right;    ROBOT ASSISTED LAPAROSCOPIC RADICAL PROSTATECTOMY N/A 01/17/2015   Procedure: ROBOTIC ASSISTED LAPAROSCOPIC RADICAL PROSTATECTOMY WITH INDOCYANINE GREEN DYE INJECTION;  Surgeon: Osborn Blaze, MD;  Location: [facility];  Service: Urology;  Laterality: N/A;   SHOULDER SURGERY     x 2   TONSILLECTOMY      as a child    Family History  Problem Relation Age of Onset   Other Mother        no neg hx   Other Father        no neg hx   Social History:  reports that he has never smoked. He has never used smokeless tobacco. He reports current alcohol use. He reports that he does not use drugs.  Allergies:  Allergies  Allergen Reactions   Poison Ivy Extract Rash   Tetracycline Hcl Rash    Medications Prior to Admission  Medication Sig Dispense Refill   acetaminophen  (TYLENOL ) 650 MG CR tablet Take 1,300 mg by mouth every 8 (eight) hours as needed for pain.     apixaban  (ELIQUIS ) 2.5 MG TABS tablet TAKE ONE TABLET BY MOUTH TWICE DAILY 180 tablet 1   diltiazem  (CARDIZEM ) 30 MG tablet TAKE ONE TABLET BY MOUTH EVERY TWELVE HOURS 180 tablet 3    No results found for this or any previous visit (from the past 48 hours). No results found.  Review of Systems  no recent f/c/n/v/wt loss  Height 5' 6 (1.676 m), weight 76.2 kg. Physical Exam  Wn wd elderly male in nad.  A and O.  EOMI.  L ankle splinted.  Brisk cap refill at the toes.  INtact sens to LT at the toes.  Active PF and DF of the toes.  Assessment/Plan L ankle displaced bimal fracture - to the OR  today for ORIF.  The risks and benefits of the alternative treatment options have been discussed in detail.  The patient wishes to proceed with surgery and specifically understands risks of bleeding, infection, nerve damage, blood clots, need for additional surgery, amputation and death.   Amada Backer, MD 2024-02-17, 10:51 AM

## 2024-01-26 NOTE — Anesthesia Procedure Notes (Signed)
 Anesthesia Regional Block: Adductor canal block   Pre-Anesthetic Checklist: , timeout performed,  Correct Patient, Correct Site, Correct Laterality,  Correct Procedure, Correct Position, site marked,  Risks and benefits discussed,  Surgical consent,  Pre-op evaluation,  At surgeon's request and post-op pain management  Laterality: Lower and Left  Prep: chloraprep       Needles:  Injection technique: Single-shot  Needle Type: Echogenic Needle     Needle Length: 9cm  Needle Gauge: 22     Additional Needles:   Procedures:,,,, ultrasound used (permanent image in chart),,    Narrative:  Start time: 01/26/2024 11:56 AM End time: 01/26/2024 11:59 AM Injection made incrementally with aspirations every 5 mL.  Performed by: Personally  Anesthesiologist: Rosalita Combe, MD  Additional Notes: Block assessed prior to surgery. Pt tolerated procedure well.

## 2024-01-26 NOTE — Anesthesia Procedure Notes (Signed)
 Anesthesia Regional Block: Popliteal block   Pre-Anesthetic Checklist: , timeout performed,  Correct Patient, Correct Site, Correct Laterality,  Correct Procedure, Correct Position, site marked,  Risks and benefits discussed,  Pre-op evaluation,  At surgeon's request and post-op pain management  Laterality: Lower  Prep: Maximum Sterile Barrier Precautions used, chloraprep       Needles:  Injection technique: Single-shot  Needle Type: Echogenic Needle     Needle Length: 9cm  Needle Gauge: 21     Additional Needles:   Procedures:,,,, ultrasound used (permanent image in chart),,    Narrative:  Start time: 01/26/2024 11:50 AM End time: 01/26/2024 11:55 AM Injection made incrementally with aspirations every 5 mL.  Performed by: Personally  Anesthesiologist: Rosalita Combe, MD  Additional Notes: Block assessed. Patient tolerated procedure well.

## 2024-01-26 NOTE — Transfer of Care (Signed)
 Immediate Anesthesia Transfer of Care Note  Patient: Joseph [name]  Procedure(s) Performed: OPEN REDUCTION INTERNAL FIXATION (ORIF) ANKLE FRACTURE (Left: Ankle)  Patient Location: PACU  Anesthesia Type:General  Level of Consciousness: sedated  Airway & Oxygen Therapy: Patient Spontanous Breathing and Patient connected to face mask oxygen  Post-op Assessment: Report given to RN and Post -op Vital signs reviewed and stable  Post vital signs: Reviewed and stable  Last Vitals:  Vitals Value Taken Time  BP 88/66 01/26/24 16:09  Temp 36.6 C 01/26/24 16:05  Pulse 65 01/26/24 16:11  Resp 14 01/26/24 16:11  SpO2 97 % 01/26/24 16:11  Vitals shown include unfiled device data.  Last Pain:  Vitals:   01/26/24 1605  TempSrc:   PainSc: Asleep         Complications: No notable events documented.

## 2024-01-26 NOTE — Progress Notes (Signed)
Assisted Dr. Houser with left, adductor canal, popliteal, ultrasound guided block. Side rails up, monitors on throughout procedure. See vital signs in flow sheet. Tolerated Procedure well. 

## 2024-01-27 ENCOUNTER — Encounter (HOSPITAL_BASED_OUTPATIENT_CLINIC_OR_DEPARTMENT_OTHER): Payer: Self-pay | Admitting: Orthopedic Surgery

## 2024-01-27 NOTE — Anesthesia Postprocedure Evaluation (Signed)
 Anesthesia Post Note  Patient: Joseph [name]  Procedure(s) Performed: OPEN REDUCTION INTERNAL FIXATION (ORIF) ANKLE FRACTURE (Left: Ankle)     Patient location during evaluation: PACU Anesthesia Type: Regional and General Level of consciousness: awake and alert Pain management: pain level controlled Vital Signs Assessment: post-procedure vital signs reviewed and stable Respiratory status: spontaneous breathing, nonlabored ventilation, respiratory function stable and patient connected to nasal cannula oxygen Cardiovascular status: blood pressure returned to baseline and stable Postop Assessment: no apparent nausea or vomiting Anesthetic complications: no   No notable events documented.  Last Vitals:  Vitals:   01/26/24 1645 01/26/24 1704  BP: 117/68 117/62  Pulse: 72 81  Resp: 19 17  Temp:  (!) 36.1 C  SpO2: 94% 95%    Last Pain:  Vitals:   01/26/24 1704  TempSrc: Temporal  PainSc:    Pain Goal:                   Rosalita Combe

## 2024-02-09 DIAGNOSIS — S82852A Displaced trimalleolar fracture of left lower leg, initial encounter for closed fracture: Secondary | ICD-10-CM | POA: Diagnosis not present

## 2024-02-09 DIAGNOSIS — S82852D Displaced trimalleolar fracture of left lower leg, subsequent encounter for closed fracture with routine healing: Secondary | ICD-10-CM | POA: Diagnosis not present

## 2024-02-09 DIAGNOSIS — Z4889 Encounter for other specified surgical aftercare: Secondary | ICD-10-CM | POA: Diagnosis not present

## 2024-03-12 DIAGNOSIS — Z4889 Encounter for other specified surgical aftercare: Secondary | ICD-10-CM | POA: Diagnosis not present

## 2024-03-12 DIAGNOSIS — S82852D Displaced trimalleolar fracture of left lower leg, subsequent encounter for closed fracture with routine healing: Secondary | ICD-10-CM | POA: Diagnosis not present

## 2024-03-30 DIAGNOSIS — R2681 Unsteadiness on feet: Secondary | ICD-10-CM | POA: Diagnosis not present

## 2024-03-30 DIAGNOSIS — S82852S Displaced trimalleolar fracture of left lower leg, sequela: Secondary | ICD-10-CM | POA: Diagnosis not present

## 2024-03-30 DIAGNOSIS — S82852A Displaced trimalleolar fracture of left lower leg, initial encounter for closed fracture: Secondary | ICD-10-CM | POA: Diagnosis not present

## 2024-03-30 DIAGNOSIS — M6281 Muscle weakness (generalized): Secondary | ICD-10-CM | POA: Diagnosis not present

## 2024-04-10 DIAGNOSIS — R2681 Unsteadiness on feet: Secondary | ICD-10-CM | POA: Diagnosis not present

## 2024-04-10 DIAGNOSIS — M6281 Muscle weakness (generalized): Secondary | ICD-10-CM | POA: Diagnosis not present

## 2024-04-10 DIAGNOSIS — S82852S Displaced trimalleolar fracture of left lower leg, sequela: Secondary | ICD-10-CM | POA: Diagnosis not present

## 2024-04-10 DIAGNOSIS — S82852A Displaced trimalleolar fracture of left lower leg, initial encounter for closed fracture: Secondary | ICD-10-CM | POA: Diagnosis not present

## 2024-04-12 DIAGNOSIS — S82852S Displaced trimalleolar fracture of left lower leg, sequela: Secondary | ICD-10-CM | POA: Diagnosis not present

## 2024-04-12 DIAGNOSIS — M6281 Muscle weakness (generalized): Secondary | ICD-10-CM | POA: Diagnosis not present

## 2024-04-12 DIAGNOSIS — R2681 Unsteadiness on feet: Secondary | ICD-10-CM | POA: Diagnosis not present

## 2024-04-12 DIAGNOSIS — S82852A Displaced trimalleolar fracture of left lower leg, initial encounter for closed fracture: Secondary | ICD-10-CM | POA: Diagnosis not present

## 2024-04-13 DIAGNOSIS — S82852D Displaced trimalleolar fracture of left lower leg, subsequent encounter for closed fracture with routine healing: Secondary | ICD-10-CM | POA: Diagnosis not present

## 2024-04-13 DIAGNOSIS — Z4789 Encounter for other orthopedic aftercare: Secondary | ICD-10-CM | POA: Diagnosis not present

## 2024-04-17 DIAGNOSIS — S82852A Displaced trimalleolar fracture of left lower leg, initial encounter for closed fracture: Secondary | ICD-10-CM | POA: Diagnosis not present

## 2024-04-17 DIAGNOSIS — S82852S Displaced trimalleolar fracture of left lower leg, sequela: Secondary | ICD-10-CM | POA: Diagnosis not present

## 2024-04-17 DIAGNOSIS — M6281 Muscle weakness (generalized): Secondary | ICD-10-CM | POA: Diagnosis not present

## 2024-04-17 DIAGNOSIS — R2681 Unsteadiness on feet: Secondary | ICD-10-CM | POA: Diagnosis not present

## 2024-04-19 DIAGNOSIS — S82852S Displaced trimalleolar fracture of left lower leg, sequela: Secondary | ICD-10-CM | POA: Diagnosis not present

## 2024-04-19 DIAGNOSIS — M6281 Muscle weakness (generalized): Secondary | ICD-10-CM | POA: Diagnosis not present

## 2024-04-19 DIAGNOSIS — S82852A Displaced trimalleolar fracture of left lower leg, initial encounter for closed fracture: Secondary | ICD-10-CM | POA: Diagnosis not present

## 2024-04-19 DIAGNOSIS — R2681 Unsteadiness on feet: Secondary | ICD-10-CM | POA: Diagnosis not present

## 2024-04-24 DIAGNOSIS — R2681 Unsteadiness on feet: Secondary | ICD-10-CM | POA: Diagnosis not present

## 2024-04-24 DIAGNOSIS — S82852S Displaced trimalleolar fracture of left lower leg, sequela: Secondary | ICD-10-CM | POA: Diagnosis not present

## 2024-04-24 DIAGNOSIS — S82852A Displaced trimalleolar fracture of left lower leg, initial encounter for closed fracture: Secondary | ICD-10-CM | POA: Diagnosis not present

## 2024-04-24 DIAGNOSIS — M6281 Muscle weakness (generalized): Secondary | ICD-10-CM | POA: Diagnosis not present

## 2024-04-26 DIAGNOSIS — S82852S Displaced trimalleolar fracture of left lower leg, sequela: Secondary | ICD-10-CM | POA: Diagnosis not present

## 2024-04-26 DIAGNOSIS — S82852A Displaced trimalleolar fracture of left lower leg, initial encounter for closed fracture: Secondary | ICD-10-CM | POA: Diagnosis not present

## 2024-04-26 DIAGNOSIS — R2681 Unsteadiness on feet: Secondary | ICD-10-CM | POA: Diagnosis not present

## 2024-04-26 DIAGNOSIS — M6281 Muscle weakness (generalized): Secondary | ICD-10-CM | POA: Diagnosis not present

## 2024-05-01 DIAGNOSIS — S82852A Displaced trimalleolar fracture of left lower leg, initial encounter for closed fracture: Secondary | ICD-10-CM | POA: Diagnosis not present

## 2024-05-01 DIAGNOSIS — R2681 Unsteadiness on feet: Secondary | ICD-10-CM | POA: Diagnosis not present

## 2024-05-01 DIAGNOSIS — M6281 Muscle weakness (generalized): Secondary | ICD-10-CM | POA: Diagnosis not present

## 2024-05-01 DIAGNOSIS — S82852S Displaced trimalleolar fracture of left lower leg, sequela: Secondary | ICD-10-CM | POA: Diagnosis not present

## 2024-05-03 DIAGNOSIS — M6281 Muscle weakness (generalized): Secondary | ICD-10-CM | POA: Diagnosis not present

## 2024-05-03 DIAGNOSIS — R2681 Unsteadiness on feet: Secondary | ICD-10-CM | POA: Diagnosis not present

## 2024-05-03 DIAGNOSIS — S82852S Displaced trimalleolar fracture of left lower leg, sequela: Secondary | ICD-10-CM | POA: Diagnosis not present

## 2024-05-03 DIAGNOSIS — S82852A Displaced trimalleolar fracture of left lower leg, initial encounter for closed fracture: Secondary | ICD-10-CM | POA: Diagnosis not present

## 2024-05-08 DIAGNOSIS — M6281 Muscle weakness (generalized): Secondary | ICD-10-CM | POA: Diagnosis not present

## 2024-05-08 DIAGNOSIS — S82852A Displaced trimalleolar fracture of left lower leg, initial encounter for closed fracture: Secondary | ICD-10-CM | POA: Diagnosis not present

## 2024-05-08 DIAGNOSIS — S82852S Displaced trimalleolar fracture of left lower leg, sequela: Secondary | ICD-10-CM | POA: Diagnosis not present

## 2024-05-08 DIAGNOSIS — R2681 Unsteadiness on feet: Secondary | ICD-10-CM | POA: Diagnosis not present

## 2024-05-09 DIAGNOSIS — S82852D Displaced trimalleolar fracture of left lower leg, subsequent encounter for closed fracture with routine healing: Secondary | ICD-10-CM | POA: Diagnosis not present

## 2024-05-11 DIAGNOSIS — S82852S Displaced trimalleolar fracture of left lower leg, sequela: Secondary | ICD-10-CM | POA: Diagnosis not present

## 2024-05-11 DIAGNOSIS — M6281 Muscle weakness (generalized): Secondary | ICD-10-CM | POA: Diagnosis not present

## 2024-05-11 DIAGNOSIS — S82852A Displaced trimalleolar fracture of left lower leg, initial encounter for closed fracture: Secondary | ICD-10-CM | POA: Diagnosis not present

## 2024-05-11 DIAGNOSIS — R2681 Unsteadiness on feet: Secondary | ICD-10-CM | POA: Diagnosis not present

## 2024-05-17 DIAGNOSIS — S82852A Displaced trimalleolar fracture of left lower leg, initial encounter for closed fracture: Secondary | ICD-10-CM | POA: Diagnosis not present

## 2024-05-17 DIAGNOSIS — R2681 Unsteadiness on feet: Secondary | ICD-10-CM | POA: Diagnosis not present

## 2024-05-17 DIAGNOSIS — M6281 Muscle weakness (generalized): Secondary | ICD-10-CM | POA: Diagnosis not present

## 2024-05-17 DIAGNOSIS — S82852S Displaced trimalleolar fracture of left lower leg, sequela: Secondary | ICD-10-CM | POA: Diagnosis not present

## 2024-05-17 DIAGNOSIS — Z23 Encounter for immunization: Secondary | ICD-10-CM | POA: Diagnosis not present

## 2024-05-22 DIAGNOSIS — S82852S Displaced trimalleolar fracture of left lower leg, sequela: Secondary | ICD-10-CM | POA: Diagnosis not present

## 2024-05-22 DIAGNOSIS — M6281 Muscle weakness (generalized): Secondary | ICD-10-CM | POA: Diagnosis not present

## 2024-05-22 DIAGNOSIS — R2681 Unsteadiness on feet: Secondary | ICD-10-CM | POA: Diagnosis not present

## 2024-05-22 DIAGNOSIS — S82852A Displaced trimalleolar fracture of left lower leg, initial encounter for closed fracture: Secondary | ICD-10-CM | POA: Diagnosis not present

## 2024-05-24 DIAGNOSIS — M6281 Muscle weakness (generalized): Secondary | ICD-10-CM | POA: Diagnosis not present

## 2024-05-24 DIAGNOSIS — R2681 Unsteadiness on feet: Secondary | ICD-10-CM | POA: Diagnosis not present

## 2024-05-24 DIAGNOSIS — S82852S Displaced trimalleolar fracture of left lower leg, sequela: Secondary | ICD-10-CM | POA: Diagnosis not present

## 2024-06-18 DIAGNOSIS — L570 Actinic keratosis: Secondary | ICD-10-CM | POA: Diagnosis not present

## 2024-06-18 DIAGNOSIS — L814 Other melanin hyperpigmentation: Secondary | ICD-10-CM | POA: Diagnosis not present

## 2024-06-18 DIAGNOSIS — D492 Neoplasm of unspecified behavior of bone, soft tissue, and skin: Secondary | ICD-10-CM | POA: Diagnosis not present

## 2024-06-18 DIAGNOSIS — L821 Other seborrheic keratosis: Secondary | ICD-10-CM | POA: Diagnosis not present

## 2024-06-22 DIAGNOSIS — Z7901 Long term (current) use of anticoagulants: Secondary | ICD-10-CM | POA: Diagnosis not present

## 2024-06-22 DIAGNOSIS — Z9181 History of falling: Secondary | ICD-10-CM | POA: Diagnosis not present

## 2024-06-22 DIAGNOSIS — Z9989 Dependence on other enabling machines and devices: Secondary | ICD-10-CM | POA: Diagnosis not present

## 2024-06-22 DIAGNOSIS — D6869 Other thrombophilia: Secondary | ICD-10-CM | POA: Diagnosis not present

## 2024-06-22 DIAGNOSIS — N1831 Chronic kidney disease, stage 3a: Secondary | ICD-10-CM | POA: Diagnosis not present

## 2024-06-22 DIAGNOSIS — I4891 Unspecified atrial fibrillation: Secondary | ICD-10-CM | POA: Diagnosis not present

## 2024-06-22 DIAGNOSIS — I129 Hypertensive chronic kidney disease with stage 1 through stage 4 chronic kidney disease, or unspecified chronic kidney disease: Secondary | ICD-10-CM | POA: Diagnosis not present

## 2024-06-22 DIAGNOSIS — Z8546 Personal history of malignant neoplasm of prostate: Secondary | ICD-10-CM | POA: Diagnosis not present

## 2024-06-22 DIAGNOSIS — N393 Stress incontinence (female) (male): Secondary | ICD-10-CM | POA: Diagnosis not present

## 2024-07-02 ENCOUNTER — Other Ambulatory Visit: Payer: Self-pay | Admitting: Cardiology

## 2024-07-09 NOTE — Telephone Encounter (Signed)
 Prescription refill request for Eliquis  received. Indication: A. FIB Last office visit:  10/21/23 Scr: 1.37 (01/19/24 EPIC) Age: 88 Weight: 70 kg  Per protocol okay to refill.

## 2024-07-16 DIAGNOSIS — L570 Actinic keratosis: Secondary | ICD-10-CM | POA: Diagnosis not present

## 2024-07-16 DIAGNOSIS — L820 Inflamed seborrheic keratosis: Secondary | ICD-10-CM | POA: Diagnosis not present

## 2024-11-05 ENCOUNTER — Ambulatory Visit: Admitting: Cardiology
